# Patient Record
Sex: Male | Born: 1970
Health system: Southern US, Community
[De-identification: ages and names within clinical notes are randomized; demographics above are authoritative.]

## PROBLEM LIST (undated history)

## (undated) DIAGNOSIS — G894 Chronic pain syndrome: Secondary | ICD-10-CM

## (undated) DIAGNOSIS — F10239 Alcohol dependence with withdrawal, unspecified: Secondary | ICD-10-CM

## (undated) DIAGNOSIS — D696 Thrombocytopenia, unspecified: Secondary | ICD-10-CM

## (undated) DIAGNOSIS — F1010 Alcohol abuse, uncomplicated: Secondary | ICD-10-CM

## (undated) DIAGNOSIS — F419 Anxiety disorder, unspecified: Secondary | ICD-10-CM

## (undated) DIAGNOSIS — K7010 Alcoholic hepatitis without ascites: Secondary | ICD-10-CM

## (undated) DIAGNOSIS — J189 Pneumonia, unspecified organism: Secondary | ICD-10-CM

## (undated) DIAGNOSIS — IMO0002 Reserved for concepts with insufficient information to code with codable children: Secondary | ICD-10-CM

## (undated) DIAGNOSIS — L409 Psoriasis, unspecified: Secondary | ICD-10-CM

## (undated) DIAGNOSIS — R0600 Dyspnea, unspecified: Secondary | ICD-10-CM

## (undated) HISTORY — PX: ORIF FEMORAL SHAFT FRACTURE W/ PLATES AND SCREWS: SUR931

## (undated) HISTORY — PX: HERNIA REPAIR: SHX51

## (undated) HISTORY — PX: JOINT REPLACEMENT: SHX530

---

## 2006-04-04 ENCOUNTER — Emergency Department (HOSPITAL_COMMUNITY): Admission: EM | Admit: 2006-04-04 | Discharge: 2006-04-04 | Payer: Self-pay | Admitting: Emergency Medicine

## 2013-09-05 ENCOUNTER — Encounter (HOSPITAL_COMMUNITY): Payer: Self-pay | Admitting: Emergency Medicine

## 2013-09-05 ENCOUNTER — Inpatient Hospital Stay (HOSPITAL_COMMUNITY): Payer: MEDICAID

## 2013-09-05 ENCOUNTER — Inpatient Hospital Stay (HOSPITAL_COMMUNITY)
Admission: EM | Admit: 2013-09-05 | Discharge: 2013-09-06 | DRG: 896 | Disposition: A | Discharge status: Other Acute Inpatient Hospital | Payer: MEDICAID | Attending: Internal Medicine | Admitting: Internal Medicine

## 2013-09-05 DIAGNOSIS — R740 Nonspecific elevation of levels of transaminase and lactic acid dehydrogenase [LDH]: Secondary | ICD-10-CM

## 2013-09-05 DIAGNOSIS — F1020 Alcohol dependence, uncomplicated: Secondary | ICD-10-CM | POA: Diagnosis present

## 2013-09-05 DIAGNOSIS — B182 Chronic viral hepatitis C: Secondary | ICD-10-CM

## 2013-09-05 DIAGNOSIS — F17200 Nicotine dependence, unspecified, uncomplicated: Secondary | ICD-10-CM | POA: Diagnosis present

## 2013-09-05 DIAGNOSIS — F10939 Alcohol use, unspecified with withdrawal, unspecified: Secondary | ICD-10-CM | POA: Diagnosis present

## 2013-09-05 DIAGNOSIS — F10231 Alcohol dependence with withdrawal delirium: Principal | ICD-10-CM | POA: Diagnosis present

## 2013-09-05 DIAGNOSIS — K7460 Unspecified cirrhosis of liver: Secondary | ICD-10-CM

## 2013-09-05 DIAGNOSIS — J189 Pneumonia, unspecified organism: Secondary | ICD-10-CM | POA: Diagnosis present

## 2013-09-05 DIAGNOSIS — L408 Other psoriasis: Secondary | ICD-10-CM | POA: Diagnosis present

## 2013-09-05 DIAGNOSIS — F10931 Alcohol use, unspecified with withdrawal delirium: Principal | ICD-10-CM | POA: Diagnosis present

## 2013-09-05 DIAGNOSIS — E876 Hypokalemia: Secondary | ICD-10-CM | POA: Diagnosis present

## 2013-09-05 DIAGNOSIS — R7401 Elevation of levels of liver transaminase levels: Secondary | ICD-10-CM | POA: Diagnosis present

## 2013-09-05 DIAGNOSIS — M549 Dorsalgia, unspecified: Secondary | ICD-10-CM | POA: Diagnosis present

## 2013-09-05 DIAGNOSIS — E869 Volume depletion, unspecified: Secondary | ICD-10-CM | POA: Diagnosis present

## 2013-09-05 DIAGNOSIS — D6959 Other secondary thrombocytopenia: Secondary | ICD-10-CM | POA: Diagnosis present

## 2013-09-05 DIAGNOSIS — K700 Alcoholic fatty liver: Secondary | ICD-10-CM | POA: Diagnosis present

## 2013-09-05 DIAGNOSIS — K7010 Alcoholic hepatitis without ascites: Secondary | ICD-10-CM | POA: Diagnosis present

## 2013-09-05 DIAGNOSIS — D696 Thrombocytopenia, unspecified: Secondary | ICD-10-CM | POA: Diagnosis present

## 2013-09-05 DIAGNOSIS — F10239 Alcohol dependence with withdrawal, unspecified: Secondary | ICD-10-CM

## 2013-09-05 DIAGNOSIS — M542 Cervicalgia: Secondary | ICD-10-CM | POA: Diagnosis present

## 2013-09-05 DIAGNOSIS — R7402 Elevation of levels of lactic acid dehydrogenase (LDH): Secondary | ICD-10-CM | POA: Diagnosis present

## 2013-09-05 DIAGNOSIS — F1210 Cannabis abuse, uncomplicated: Secondary | ICD-10-CM | POA: Diagnosis present

## 2013-09-05 DIAGNOSIS — F1010 Alcohol abuse, uncomplicated: Secondary | ICD-10-CM | POA: Diagnosis present

## 2013-09-05 DIAGNOSIS — G894 Chronic pain syndrome: Secondary | ICD-10-CM | POA: Diagnosis present

## 2013-09-05 HISTORY — DX: Alcohol abuse, uncomplicated: F10.10

## 2013-09-05 HISTORY — DX: Alcoholic hepatitis without ascites: K70.10

## 2013-09-05 HISTORY — DX: Pneumonia, unspecified organism: J18.9

## 2013-09-05 HISTORY — DX: Psoriasis, unspecified: L40.9

## 2013-09-05 HISTORY — DX: Reserved for concepts with insufficient information to code with codable children: IMO0002

## 2013-09-05 HISTORY — DX: Alcohol use, unspecified with withdrawal, unspecified: F10.939

## 2013-09-05 HISTORY — DX: Alcohol dependence with withdrawal, unspecified: F10.239

## 2013-09-05 HISTORY — DX: Thrombocytopenia, unspecified: D69.6

## 2013-09-05 HISTORY — DX: Chronic pain syndrome: G89.4

## 2013-09-05 LAB — CBC WITH DIFFERENTIAL/PLATELET
Basophils Absolute: 0 10*3/uL (ref 0.0–0.1)
Basophils Relative: 1 % (ref 0–1)
Eosinophils Absolute: 0.2 10*3/uL (ref 0.0–0.7)
Eosinophils Relative: 4 % (ref 0–5)
HCT: 44.2 % (ref 39.0–52.0)
Hemoglobin: 15.5 g/dL (ref 13.0–17.0)
Lymphocytes Relative: 52 % — ABNORMAL HIGH (ref 12–46)
Lymphs Abs: 2.4 10*3/uL (ref 0.7–4.0)
MCH: 32.1 pg (ref 26.0–34.0)
MCHC: 35.1 g/dL (ref 30.0–36.0)
MCV: 91.5 fL (ref 78.0–100.0)
Monocytes Absolute: 0.3 10*3/uL (ref 0.1–1.0)
Monocytes Relative: 7 % (ref 3–12)
Neutro Abs: 1.6 10*3/uL — ABNORMAL LOW (ref 1.7–7.7)
Neutrophils Relative %: 36 % — ABNORMAL LOW (ref 43–77)
Platelets: 86 10*3/uL — ABNORMAL LOW (ref 150–400)
RBC: 4.83 MIL/uL (ref 4.22–5.81)
RDW: 12.6 % (ref 11.5–15.5)
WBC: 4.5 10*3/uL (ref 4.0–10.5)

## 2013-09-05 LAB — COMPREHENSIVE METABOLIC PANEL
ALT: 177 U/L — ABNORMAL HIGH (ref 0–53)
AST: 183 U/L — ABNORMAL HIGH (ref 0–37)
Albumin: 4.2 g/dL (ref 3.5–5.2)
Alkaline Phosphatase: 101 U/L (ref 39–117)
BUN: 7 mg/dL (ref 6–23)
CO2: 24 mEq/L (ref 19–32)
Calcium: 8.9 mg/dL (ref 8.4–10.5)
Chloride: 101 mEq/L (ref 96–112)
Creatinine, Ser: 0.77 mg/dL (ref 0.50–1.35)
GFR calc Af Amer: >90 mL/min (ref >90)
GFR calc non Af Amer: >90 mL/min (ref >90)
Glucose, Bld: 96 mg/dL (ref 70–99)
Potassium: 4.1 mEq/L (ref 3.7–5.3)
Sodium: 143 mEq/L (ref 137–147)
Total Bilirubin: 0.2 mg/dL — ABNORMAL LOW (ref 0.3–1.2)
Total Protein: 8.1 g/dL (ref 6.0–8.3)

## 2013-09-05 LAB — RAPID URINE DRUG SCREEN, HOSP PERFORMED
Amphetamines: NOT DETECTED
Barbiturates: NOT DETECTED
Benzodiazepines: NOT DETECTED
Cocaine: NOT DETECTED
Opiates: NOT DETECTED
Tetrahydrocannabinol: POSITIVE — AB

## 2013-09-05 LAB — MAGNESIUM: Magnesium: 2.2 mg/dL (ref 1.5–2.5)

## 2013-09-05 LAB — ETHANOL: Alcohol, Ethyl (B): 315 mg/dL — ABNORMAL HIGH (ref 0–11)

## 2013-09-05 LAB — PROTIME-INR
INR: 0.83 (ref 0.00–1.49)
Prothrombin Time: 11.3 seconds — ABNORMAL LOW (ref 11.6–15.2)

## 2013-09-05 IMAGING — CR DG CHEST 1V PORT
1 series · 1 of 1 positions shown · non-contrast
Comparison: Chest x-ray [DATE].

CLINICAL DATA: Shortness of breath and wheezing.

EXAM:
PORTABLE CHEST - 1 VIEW

[portable]
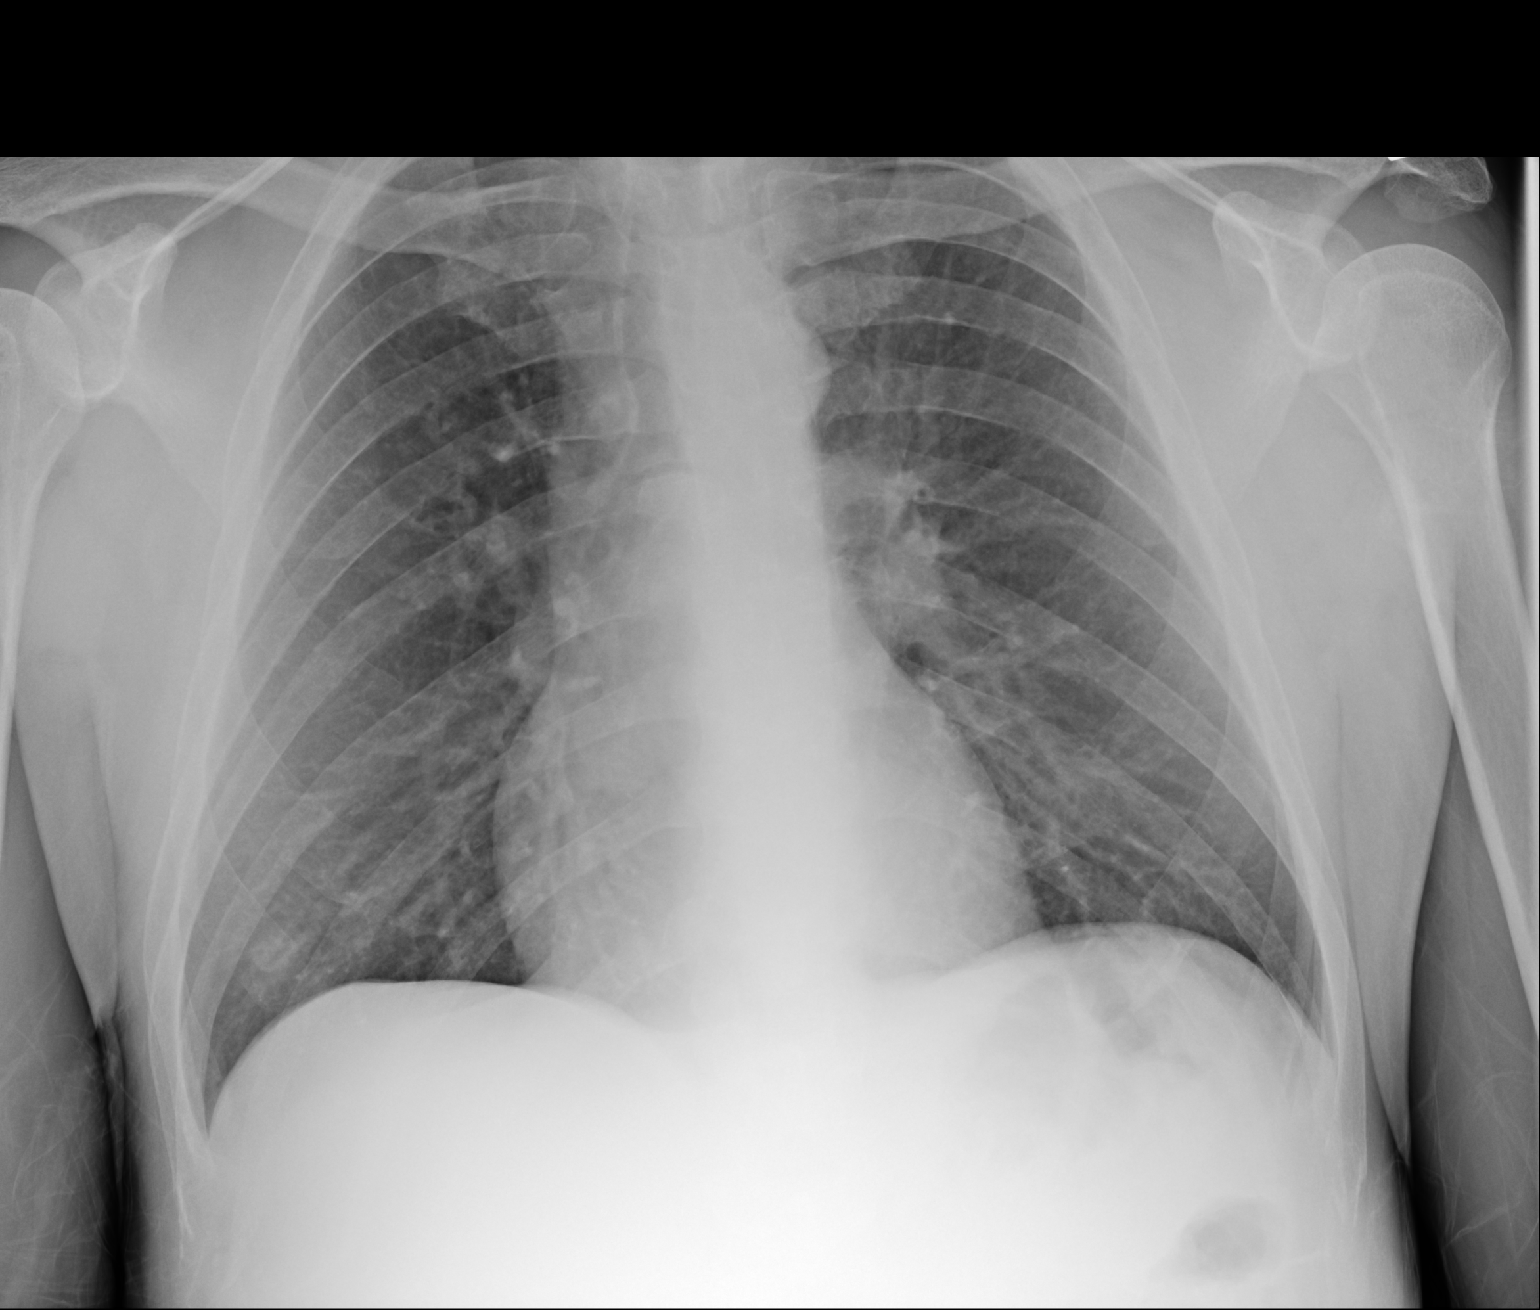

[1 of 1 positions shown; findings below may reference images not displayed]

FINDINGS: Mediastinum and hilar structures are unremarkable. Heart size and
pulmonary vascularity normal. Bilateral pulmonary interstitial
prominence noted. This suggest interstitial pneumonitis. Developing
focal nodular infiltrate versus pulmonary nodule versus nipple
shadow right lung base is noted. Followup chest x-ray suggested to
demonstrate clearing. No pleural effusion or pneumothorax. No acute
osseous abnormality.
IMPRESSION: 1.  Findings suggesting pulmonary interstitial pneumonitis.

2. Nodular density over right lung base. Follow-up chest x-ray is
suggested to demonstrate clearing. This could represent nodular
infiltrate, pulmonary nodule, or nipple shadow. Follow-up chest
x-ray should be obtained with nipple markers.

## 2013-09-05 MED ORDER — OXYCODONE HCL 5 MG PO TABS
5.0000 mg | ORAL_TABLET | ORAL | Status: DC | PRN
  Administered 2013-09-05 – 2013-09-06 (×3): 5 mg via ORAL
  Filled 2013-09-05 (×3): qty 1

## 2013-09-05 MED ORDER — LORAZEPAM 2 MG/ML IJ SOLN: 0.0000 mg | Freq: Two times a day (BID) | INTRAMUSCULAR | Status: DC

## 2013-09-05 MED ORDER — LORAZEPAM 2 MG/ML IJ SOLN
0.0000 mg | Freq: Four times a day (QID) | INTRAMUSCULAR | Status: DC
  Administered 2013-09-06 (×2): 2 mg via INTRAVENOUS
  Administered 2013-09-06: 1 mg via INTRAVENOUS
  Filled 2013-09-05 (×3): qty 1

## 2013-09-05 MED ORDER — MORPHINE SULFATE 2 MG/ML IJ SOLN
1.0000 mg | INTRAMUSCULAR | Status: DC | PRN
  Administered 2013-09-06: 1 mg via INTRAVENOUS
  Filled 2013-09-05: qty 1

## 2013-09-05 MED ORDER — LEVALBUTEROL HCL 0.63 MG/3ML IN NEBU
0.6300 mg | INHALATION_SOLUTION | Freq: Four times a day (QID) | RESPIRATORY_TRACT | Status: DC
  Administered 2013-09-06: 0.63 mg via RESPIRATORY_TRACT
  Filled 2013-09-05: qty 3

## 2013-09-05 MED ORDER — SODIUM CHLORIDE 0.9 % IV SOLN: INTRAVENOUS | Status: DC

## 2013-09-05 MED ORDER — LORAZEPAM 2 MG/ML IJ SOLN
2.0000 mg | Freq: Once | INTRAMUSCULAR | Status: AC
  Administered 2013-09-05: 2 mg via INTRAVENOUS
  Filled 2013-09-05: qty 1

## 2013-09-05 MED ORDER — NICOTINE 14 MG/24HR TD PT24
14.0000 mg | MEDICATED_PATCH | Freq: Every day | TRANSDERMAL | Status: DC
  Administered 2013-09-06 (×2): 14 mg via TRANSDERMAL
  Filled 2013-09-05: qty 1

## 2013-09-05 MED ORDER — NICOTINE 14 MG/24HR TD PT24
14.0000 mg | MEDICATED_PATCH | Freq: Every day | TRANSDERMAL | Status: DC
Start: 2013-09-06 — End: 2013-09-05
  Filled 2013-09-05: qty 1

## 2013-09-05 MED ORDER — VITAMIN B-1 100 MG PO TABS
100.0000 mg | ORAL_TABLET | Freq: Every day | ORAL | Status: DC
  Administered 2013-09-06: 100 mg via ORAL
  Filled 2013-09-05: qty 1

## 2013-09-05 MED ORDER — TRIAMCINOLONE ACETONIDE 0.1 % EX CREA
TOPICAL_CREAM | Freq: Three times a day (TID) | CUTANEOUS | Status: DC
  Administered 2013-09-06 (×2): 1 via TOPICAL
  Administered 2013-09-06: via TOPICAL
  Filled 2013-09-05: qty 15

## 2013-09-05 MED ORDER — ACETAMINOPHEN 325 MG PO TABS: 650.0000 mg | ORAL_TABLET | Freq: Four times a day (QID) | ORAL | Status: DC | PRN

## 2013-09-05 MED ORDER — LEVOFLOXACIN IN D5W 750 MG/150ML IV SOLN
750.0000 mg | Freq: Every day | INTRAVENOUS | Status: DC
  Administered 2013-09-05: 750 mg via INTRAVENOUS
  Filled 2013-09-05 (×2): qty 150

## 2013-09-05 MED ORDER — SODIUM CHLORIDE 0.9 % IV SOLN
INTRAVENOUS | Status: DC
  Administered 2013-09-05 – 2013-09-06 (×2): via INTRAVENOUS

## 2013-09-05 MED ORDER — FOLIC ACID 1 MG PO TABS
1.0000 mg | ORAL_TABLET | Freq: Every day | ORAL | Status: DC
  Administered 2013-09-06: 1 mg via ORAL
  Filled 2013-09-05: qty 1

## 2013-09-05 MED ORDER — LORAZEPAM 0.5 MG PO TABS: 1.0000 mg | ORAL_TABLET | Freq: Four times a day (QID) | ORAL | Status: DC | PRN

## 2013-09-05 MED ORDER — LEVALBUTEROL HCL 0.63 MG/3ML IN NEBU: 0.6300 mg | INHALATION_SOLUTION | RESPIRATORY_TRACT | Status: DC | PRN

## 2013-09-05 MED ORDER — LEVOFLOXACIN IN D5W 750 MG/150ML IV SOLN
INTRAVENOUS | Status: AC
  Filled 2013-09-05: qty 150

## 2013-09-05 MED ORDER — ACETAMINOPHEN 650 MG RE SUPP: 650.0000 mg | Freq: Four times a day (QID) | RECTAL | Status: DC | PRN

## 2013-09-05 MED ORDER — SODIUM CHLORIDE 0.9 % IJ SOLN
3.0000 mL | Freq: Two times a day (BID) | INTRAMUSCULAR | Status: DC
  Administered 2013-09-05 – 2013-09-06 (×2): 3 mL via INTRAVENOUS

## 2013-09-05 MED ORDER — THIAMINE HCL 100 MG/ML IJ SOLN: 100.0000 mg | Freq: Every day | INTRAMUSCULAR | Status: DC

## 2013-09-05 MED ORDER — LORAZEPAM 2 MG/ML IJ SOLN
1.0000 mg | Freq: Four times a day (QID) | INTRAMUSCULAR | Status: DC | PRN
Start: 2013-09-05 — End: 2013-09-06

## 2013-09-05 MED ORDER — ONDANSETRON HCL 4 MG PO TABS: 4.0000 mg | ORAL_TABLET | Freq: Four times a day (QID) | ORAL | Status: DC | PRN

## 2013-09-05 MED ORDER — ADULT MULTIVITAMIN W/MINERALS CH
1.0000 | ORAL_TABLET | Freq: Every day | ORAL | Status: DC
  Administered 2013-09-06: 1 via ORAL
  Filled 2013-09-05: qty 1

## 2013-09-05 MED ORDER — ONDANSETRON HCL 4 MG/2ML IJ SOLN
4.0000 mg | Freq: Four times a day (QID) | INTRAMUSCULAR | Status: DC | PRN
  Administered 2013-09-06 (×2): 4 mg via INTRAVENOUS
  Filled 2013-09-05 (×2): qty 2

## 2013-09-05 MED ORDER — SODIUM CHLORIDE 0.9 % IV BOLUS (SEPSIS)
1000.0000 mL | Freq: Once | INTRAVENOUS | Status: AC
  Administered 2013-09-05: 1000 mL via INTRAVENOUS

## 2013-09-05 MED ORDER — TRIAMCINOLONE ACETONIDE 0.1 % EX CREA
TOPICAL_CREAM | CUTANEOUS | Status: AC
  Filled 2013-09-05: qty 15
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
## 2013-09-06 ENCOUNTER — Inpatient Hospital Stay (HOSPITAL_COMMUNITY): Payer: MEDICAID

## 2013-09-06 DIAGNOSIS — K7010 Alcoholic hepatitis without ascites: Secondary | ICD-10-CM | POA: Diagnosis present

## 2013-09-06 HISTORY — DX: Alcoholic hepatitis without ascites: K70.10

## 2013-09-06 LAB — APTT: aPTT: 27 seconds (ref 24–37)

## 2013-09-06 LAB — CBC
HCT: 37.7 % — ABNORMAL LOW (ref 39.0–52.0)
Hemoglobin: 13.3 g/dL (ref 13.0–17.0)
MCH: 32.2 pg (ref 26.0–34.0)
MCHC: 35.3 g/dL (ref 30.0–36.0)
MCV: 91.3 fL (ref 78.0–100.0)
Platelets: 64 10*3/uL — ABNORMAL LOW (ref 150–400)
RBC: 4.13 MIL/uL — ABNORMAL LOW (ref 4.22–5.81)
RDW: 12.4 % (ref 11.5–15.5)
WBC: 4 10*3/uL (ref 4.0–10.5)

## 2013-09-06 LAB — HIV ANTIBODY (ROUTINE TESTING W REFLEX): HIV: NONREACTIVE

## 2013-09-06 LAB — COMPREHENSIVE METABOLIC PANEL
ALT: 136 U/L — ABNORMAL HIGH (ref 0–53)
AST: 119 U/L — ABNORMAL HIGH (ref 0–37)
Albumin: 3.5 g/dL (ref 3.5–5.2)
Alkaline Phosphatase: 82 U/L (ref 39–117)
BUN: 7 mg/dL (ref 6–23)
CO2: 25 mEq/L (ref 19–32)
Calcium: 7.9 mg/dL — ABNORMAL LOW (ref 8.4–10.5)
Chloride: 97 mEq/L (ref 96–112)
Creatinine, Ser: 0.66 mg/dL (ref 0.50–1.35)
GFR calc Af Amer: >90 mL/min (ref >90)
GFR calc non Af Amer: >90 mL/min (ref >90)
Glucose, Bld: 96 mg/dL (ref 70–99)
Potassium: 3.2 mEq/L — ABNORMAL LOW (ref 3.7–5.3)
Sodium: 136 mEq/L — ABNORMAL LOW (ref 137–147)
Total Bilirubin: 0.5 mg/dL (ref 0.3–1.2)
Total Protein: 6.8 g/dL (ref 6.0–8.3)

## 2013-09-06 LAB — HEPATITIS PANEL, ACUTE
HCV Ab: NEGATIVE
Hep A IgM: NONREACTIVE
Hep B C IgM: NONREACTIVE
Hepatitis B Surface Ag: NEGATIVE

## 2013-09-06 LAB — STREP PNEUMONIAE URINARY ANTIGEN: Strep Pneumo Urinary Antigen: NEGATIVE

## 2013-09-06 LAB — MRSA PCR SCREENING: MRSA by PCR: NEGATIVE

## 2013-09-06 LAB — VITAMIN B12: Vitamin B-12: 570 pg/mL (ref 211–911)

## 2013-09-06 IMAGING — US US ABDOMEN COMPLETE
1 series · 14 of 25 positions shown · non-contrast
Comparison: None.

CLINICAL DATA: Abnormal liver function tests.  Alcohol abuse.

EXAM:
ULTRASOUND ABDOMEN COMPLETE

[Series 1: us abdomen complete · 0.17mm/px · 14 of 98 slices shown]
[im 1/98]
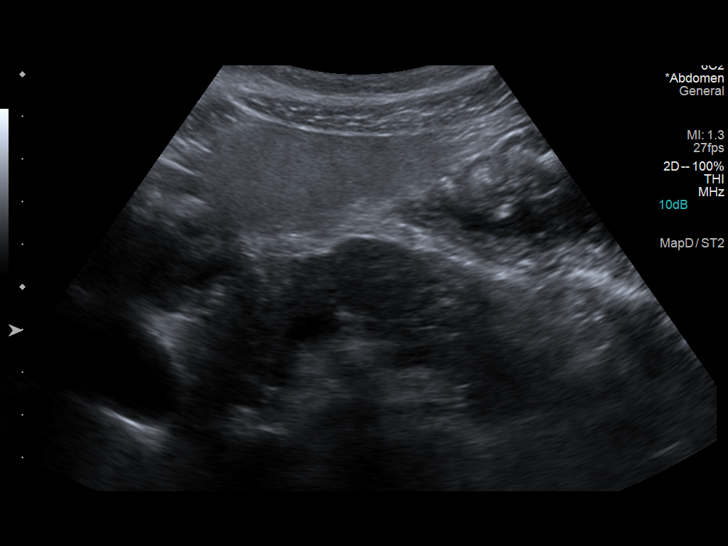
[im 9/98]
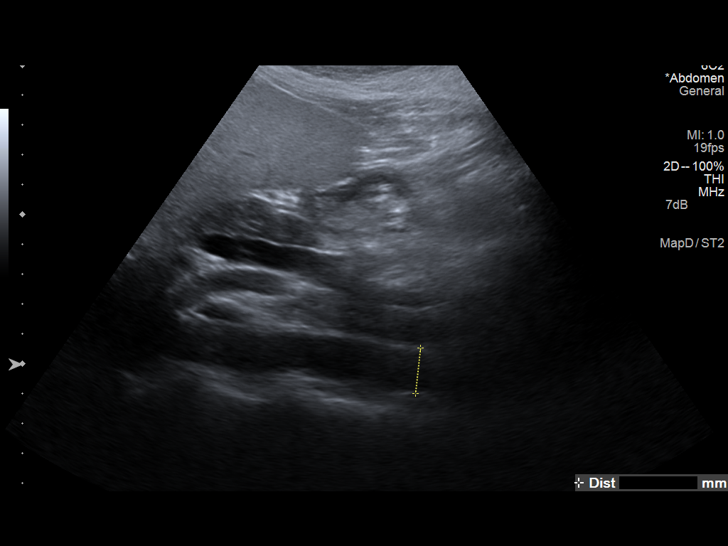
[im 17/98]
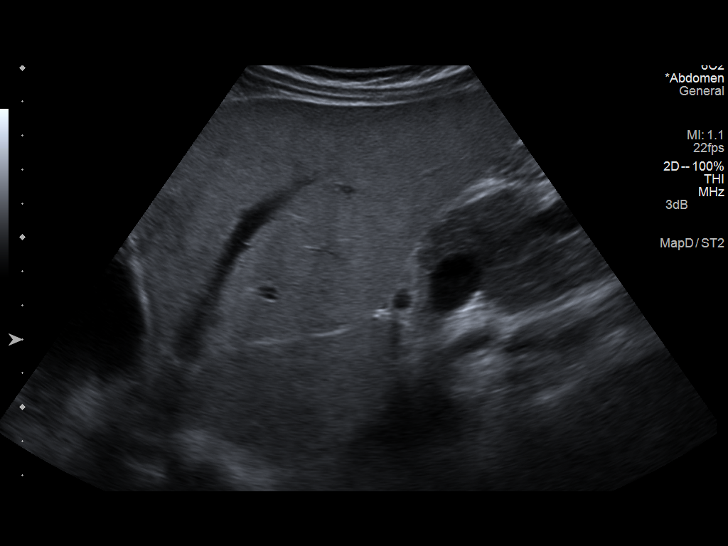
[im 25/98]
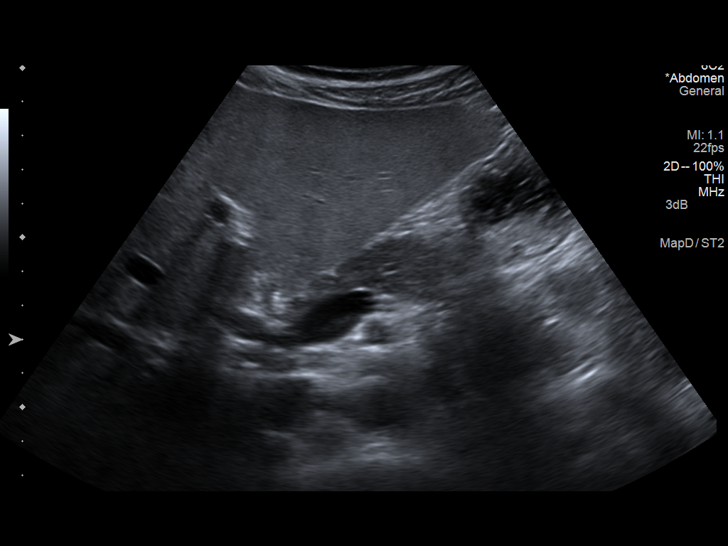
[im 33/98]
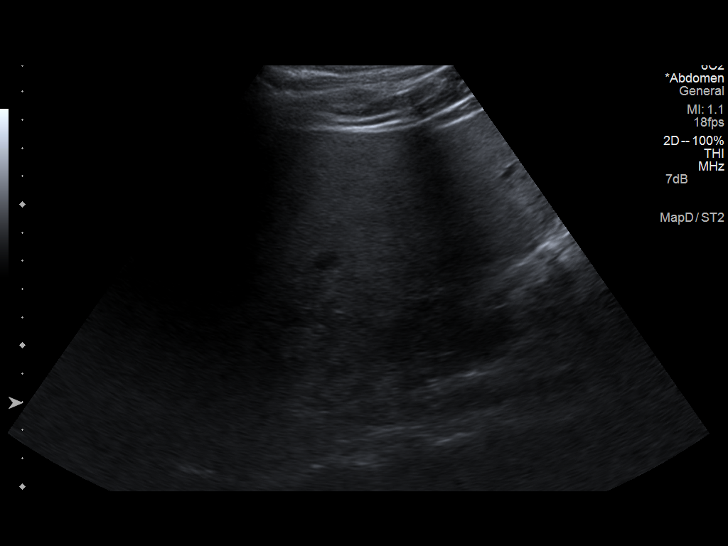
[im 37/98]
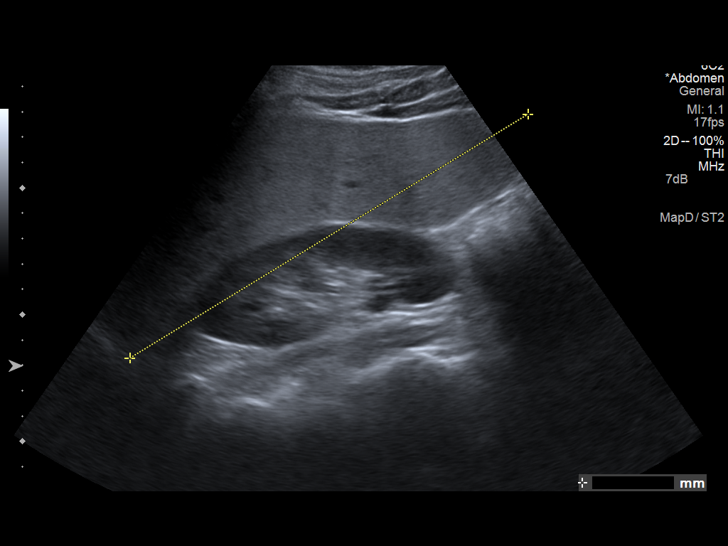
[im 45/98]
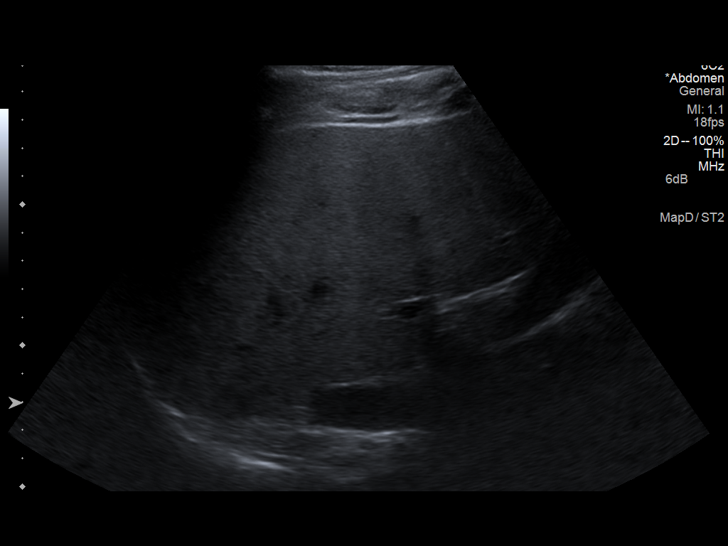
[im 53/98]
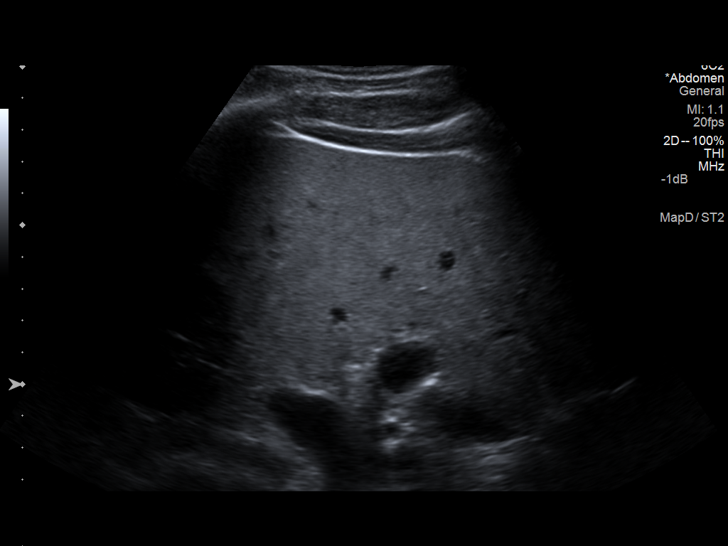
[im 61/98]
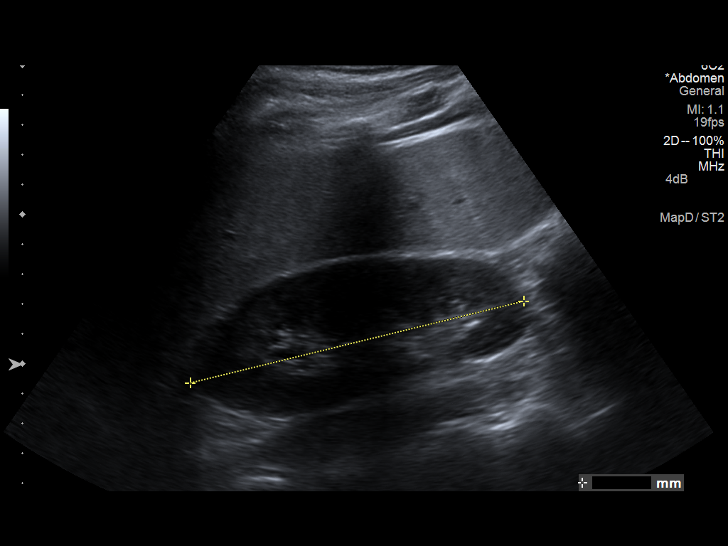
[im 65/98]
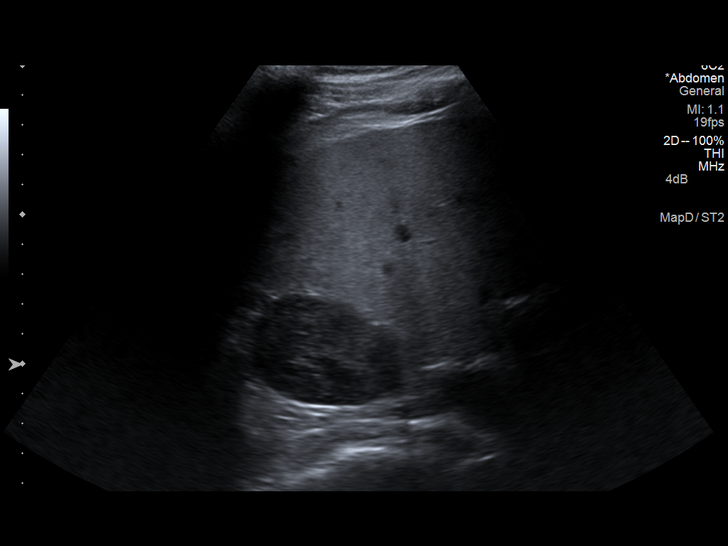
[im 73/98]
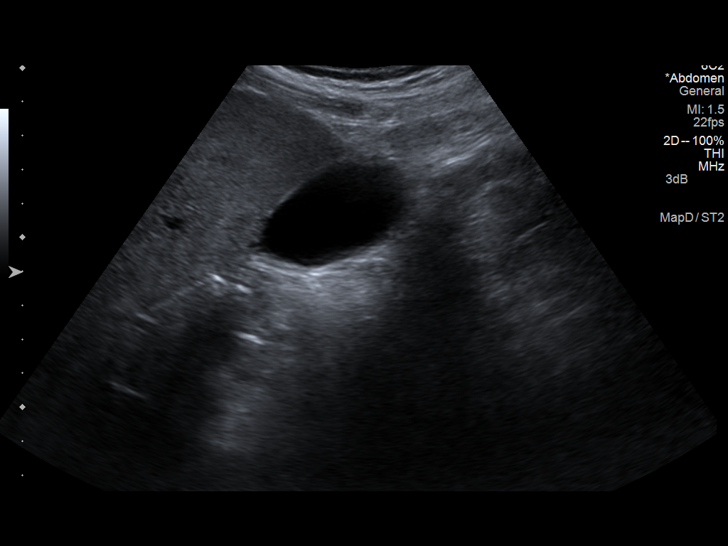
[im 81/98]
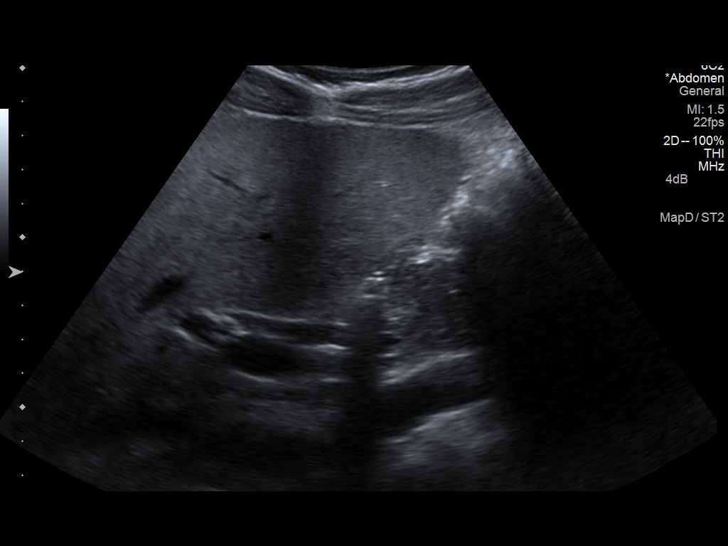
[im 89/98]
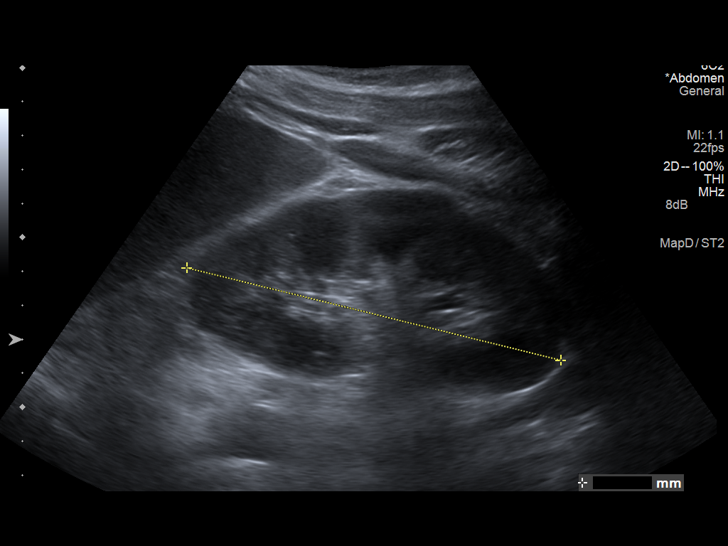
[im 98/98]
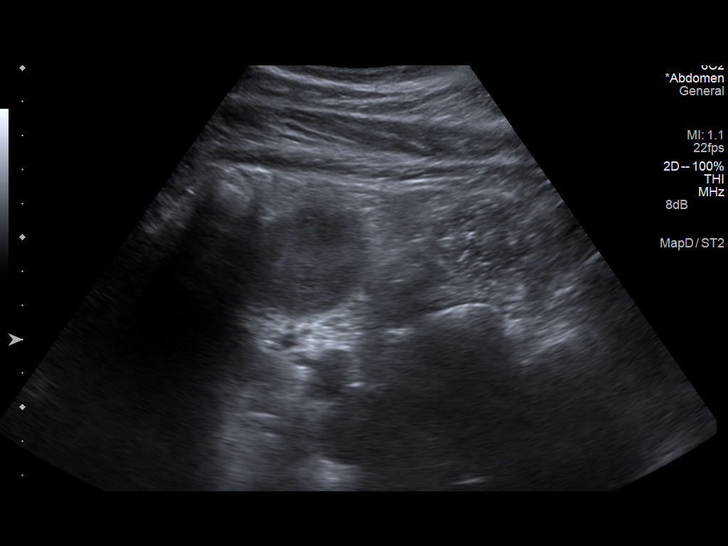

[14 of 25 positions shown; findings below may reference images not displayed]

FINDINGS: Gallbladder:

No gallstones or wall thickening visualized. No sonographic Murphy
sign noted.

Common bile duct:

Diameter: 6.7 mm.  No dilated intrahepatic bile ducts.

Liver:

No focal lesions. Diffuse increased echogenicity of the liver
parenchyma consistent with hepatic steatosis. There is hepatomegaly.

IVC:

Normal.

Pancreas:

The pancreas appears prominent and diffusely hypoechoic. No
dilatation of the pancreatic duct. No focal lesions.

Spleen:

Spleen is at the upper limits of normal, 9 cm in length.

Right Kidney:

Length: 11.5 cm. Echogenicity within normal limits. No mass or
hydronephrosis visualized.

Left Kidney:

Length: 11.3 cm. Echogenicity within normal limits. No mass or
hydronephrosis visualized.

Abdominal aorta:

Normal.  2.1 cm maximum diameter.

Other findings:

None.
IMPRESSION: IMPRESSION
Hepatomegaly with diffuse hepatic steatosis. Hypoechoic prominent
pancreas which can be seen with pancreatitis.

## 2013-09-06 IMAGING — CR DG CHEST 1V
1 series · 1 of 1 positions shown · non-contrast
Comparison: Single view of the chest [DATE].

CLINICAL DATA: Possible pulmonary nodule.

EXAM:
CHEST - 1 VIEW

[ap]
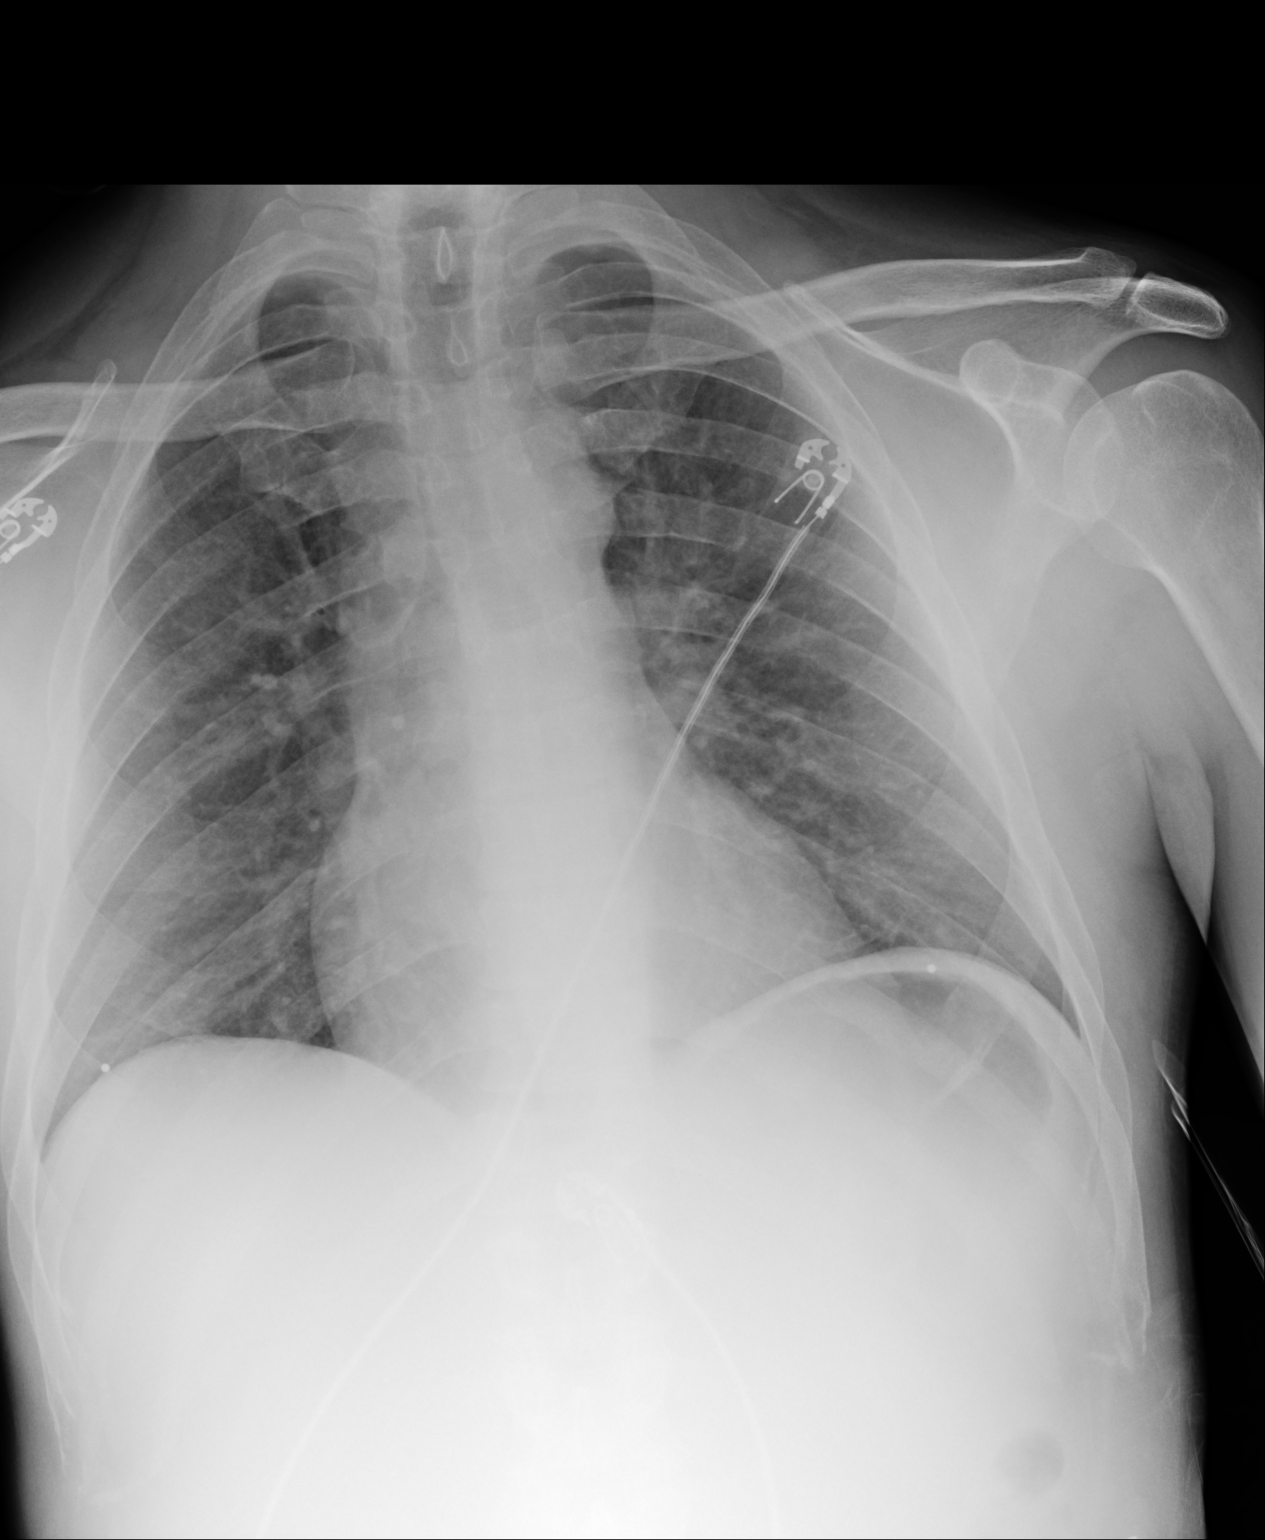

[1 of 1 positions shown; findings below may reference images not displayed]

FINDINGS: A nipple marker is in place. Questioned nodule on the prior
examination is the patient's nipple. Lungs are clear. No
pneumothorax or pleural effusion.
IMPRESSION: Negative for pulmonary nodule.  Negative exam.

## 2013-09-06 MED ORDER — POTASSIUM CHLORIDE ER 10 MEQ PO TBCR: 20.0000 meq | EXTENDED_RELEASE_TABLET | Freq: Every day | ORAL | Status: DC

## 2013-09-06 MED ORDER — LEVALBUTEROL HCL 0.63 MG/3ML IN NEBU
0.6300 mg | INHALATION_SOLUTION | Freq: Three times a day (TID) | RESPIRATORY_TRACT | Status: DC
  Administered 2013-09-06 (×2): 0.63 mg via RESPIRATORY_TRACT
  Filled 2013-09-06 (×3): qty 3

## 2013-09-06 MED ORDER — THIAMINE HCL 100 MG PO TABS: 100.0000 mg | ORAL_TABLET | Freq: Every day | ORAL | Status: DC

## 2013-09-06 MED ORDER — LEVOFLOXACIN 750 MG PO TABS: 750.0000 mg | ORAL_TABLET | Freq: Every day | ORAL | Status: DC

## 2013-09-06 MED ORDER — POTASSIUM CHLORIDE IN NACL 20-0.9 MEQ/L-% IV SOLN
INTRAVENOUS | Status: DC
  Administered 2013-09-06: 12:00:00 via INTRAVENOUS

## 2013-09-06 MED ORDER — ADULT MULTIVITAMIN W/MINERALS CH: 1.0000 | ORAL_TABLET | Freq: Every day | ORAL | Status: DC

## 2013-09-06 MED ORDER — TRIAMCINOLONE ACETONIDE 0.1 % EX CREA: TOPICAL_CREAM | Freq: Three times a day (TID) | CUTANEOUS | Status: DC

## 2013-09-06 MED ORDER — MORPHINE SULFATE 4 MG/ML IJ SOLN
3.0000 mg | INTRAMUSCULAR | Status: DC | PRN
  Administered 2013-09-06 (×2): 3 mg via INTRAVENOUS
  Filled 2013-09-06 (×2): qty 1

## 2013-09-06 MED ORDER — TRAZODONE HCL 50 MG PO TABS
25.0000 mg | ORAL_TABLET | Freq: Once | ORAL | Status: AC
  Administered 2013-09-06: 25 mg via ORAL
  Filled 2013-09-06: qty 1

## 2013-09-06 MED ORDER — OXYCODONE HCL 5 MG PO TABS: 5.0000 mg | ORAL_TABLET | ORAL | Status: DC | PRN

## 2013-09-06 MED ORDER — POTASSIUM CHLORIDE CRYS ER 20 MEQ PO TBCR
30.0000 meq | EXTENDED_RELEASE_TABLET | Freq: Two times a day (BID) | ORAL | Status: DC
  Administered 2013-09-06: 30 meq via ORAL
  Filled 2013-09-06 (×2): qty 1

## 2013-09-06 NOTE — Progress Notes (Signed)
Attempted to call in for assessment x2 which was scheduled for 1300 with Allegra GranaBob RN. Spoke with [employer] operational officersecretary who stated they still needed to obtain the tele-assessment machine. Nadine CountsBob RN will contact TTS when machine is on unit.

## 2013-09-06 NOTE — Care Management Note (Signed)
    Page 1 of 1   09/06/2013     1:28:24 PM   CARE MANAGEMENT NOTE 09/06/2013  Patient:  Frank Page,Frank Page   Account Number:  [id number]  Date Initiated:  09/06/2013  Documentation initiated by:  Sharrie RothmanBLACKWELL,Dawud Mays C  Subjective/Objective Assessment:   Pt admitted from home with etoh withdrawals. Pt live with fiance and son. Pt would like detox.     Action/Plan:   CSW is aware and will assist in placing pt for detox treatment.   Anticipated DC Date:  09/07/2013   Anticipated DC Plan:  PSYCHIATRIC HOSPITAL  In-house referral  Clinical Social Worker      DC Planning Services  CM consult      Choice offered to / List presented to:             Status of service:  Completed, signed off Medicare Important Message given?   (If response is "NO", the following Medicare IM given date fields will be blank) Date Medicare IM given:   Date Additional Medicare IM given:    Discharge Disposition:  PSYCHIATRIC HOSPITAL  Per UR Regulation:    If discussed at Long Length of Stay Meetings, dates discussed:    Comments:  09/06/13 1330 Arlyss Queenammy Asaad Gulley, RN BSN CM
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
## 2013-09-06 NOTE — Progress Notes (Signed)
UR chart review completed.  

## 2013-09-06 NOTE — Progress Notes (Signed)
Pt given discharge instructions  IV removed with site clean without any s/s of infection. Pt calm & cooperative. Pt transported via wheelchair to awaiting transportation home
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
## 2013-09-06 NOTE — Progress Notes (Signed)
[facility] PROGRESS NOTE  Frank SequinJoel D Page ZOX:[id number] DOB: September 04, 1970 DOA: 09/05/2013 PCP: Toma DeitersHASANAJ,XAJE A, MD    Code Status: Full code Family Communication: Family not available. Disposition Plan: To be determined.   Consultants:  None.  Procedures:  None.  Antibiotics:  Levaquin 09/05/2013>>  HPI/Subjective: The patient complains of neck pain, back pain, and leg pain, which is chronic. He also has to "shakes". He denies headache, nausea, vomiting, or abdominal pain.  Objective: Filed Vitals:   09/06/13 0730  BP:   Pulse:   Temp: 98.7 F (37.1 C)  Resp:    temperature 98.7. Heart rate 101. Respiratory rate 22. Blood pressure 135/88. Oxygen saturation 96% on room air.   Intake/Output Summary (Last 24 hours) at 09/06/13 0919 Last data filed at 09/06/13 0500  Gross per 24 hour  Intake    875 ml  Output      0 ml  Net    875 ml   Filed Weights   09/05/13 1732 09/05/13 2242 09/06/13 0500  Weight: 61.236 kg (135 lb) 61.1 kg (134 lb 11.2 oz) 61.1 kg (134 lb 11.2 oz)    Exam:   General:  Tremulous 43 year old Caucasian man sitting up in bed.  Cardiovascular: S1, S2, with borderline tachycardia.  Respiratory: Breathing is nonlabored. Few faint wheezes per auscultation.  Abdomen: Positive bowel sounds, soft, nontender, nondistended.  Musculoskeletal: No acute hot joints. Mild tenderness over the cervical spine and lumbar spine.  Skin: Psoriatic plaques over the lower legs.  Neuropsychiatric: He is tremulous. His speech is clear. Cranial nerves II through XII are intact.   Data Reviewed: Basic Metabolic Panel:  Recent Labs Lab 09/05/13 1950 09/06/13 0540  NA 143 136*  K 4.1 3.2*  CL 101 97  CO2 24 25  GLUCOSE 96 96  BUN 7 7  CREATININE 0.77 0.66  CALCIUM 8.9 7.9*  MG 2.2  --    Liver Function Tests:  Recent Labs Lab 09/05/13 1950 09/06/13 0540  AST 183* 119*  ALT 177* 136*  ALKPHOS 101 82  BILITOT 0.2* 0.5  PROT 8.1 6.8   ALBUMIN 4.2 3.5   No results found for this basename: LIPASE, AMYLASE,  in the last 168 hours No results found for this basename: AMMONIA,  in the last 168 hours CBC:  Recent Labs Lab 09/05/13 1950 09/06/13 0540  WBC 4.5 4.0  NEUTROABS 1.6*  --   HGB 15.5 13.3  HCT 44.2 37.7*  MCV 91.5 91.3  PLT 86* 64*   Cardiac Enzymes: No results found for this basename: CKTOTAL, CKMB, CKMBINDEX, TROPONINI,  in the last 168 hours BNP (last 3 results) No results found for this basename: PROBNP,  in the last 8760 hours CBG: No results found for this basename: GLUCAP,  in the last 168 hours  Recent Results (from the past 240 hour(s))  MRSA PCR SCREENING     Status: None   Collection Time    09/05/13  9:34 PM      Result Value Range Status   MRSA by PCR NEGATIVE  NEGATIVE Final   Comment:            The GeneXpert MRSA Assay (FDA     approved for NASAL specimens     only), is one component of a     comprehensive MRSA colonization     surveillance program. It is not     intended to diagnose MRSA     infection nor to guide or  monitor treatment for     MRSA infections.     Studies: Dg Chest Port 1 View  09/05/2013   CLINICAL DATA:  Shortness of breath and wheezing.  EXAM: PORTABLE CHEST - 1 VIEW  COMPARISON:  Chest x-ray 11/23/2011.  FINDINGS: Mediastinum and hilar structures are unremarkable. Heart size and pulmonary vascularity normal. Bilateral pulmonary interstitial prominence noted. This suggest interstitial pneumonitis. Developing focal nodular infiltrate versus pulmonary nodule versus nipple shadow right lung base is noted. Followup chest x-ray suggested to demonstrate clearing. No pleural effusion or pneumothorax. No acute osseous abnormality.  IMPRESSION: 1.  Findings suggesting pulmonary interstitial pneumonitis.  2. Nodular density over right lung base. Follow-up chest x-ray is suggested to demonstrate clearing. This could represent nodular infiltrate, pulmonary nodule, or  nipple shadow. Follow-up chest x-ray should be obtained with nipple markers.   Electronically Signed   By: Maisie Fus  Register   On: 09/05/2013 21:54    Scheduled Meds: . folic acid  1 mg Oral Daily  . levalbuterol  0.63 mg Nebulization TID  . levofloxacin (LEVAQUIN) IV  750 mg Intravenous QHS  . LORazepam  0-4 mg Intravenous Q6H   Followed by  . [START ON 09/07/2013] LORazepam  0-4 mg Intravenous Q12H  . multivitamin with minerals  1 tablet Oral Daily  . nicotine  14 mg Transdermal Daily  . sodium chloride  3 mL Intravenous Q12H  . thiamine  100 mg Oral Daily   Or  . thiamine  100 mg Intravenous Daily  . triamcinolone cream   Topical TID   Continuous Infusions: . sodium chloride 125 mL/hr at 09/05/13 2312   Assessment:  Principal Problem:   Alcohol withdrawal Active Problems:   Alcohol abuse   Transaminitis   Thrombocytopenia, unspecified   Alcoholic hepatitis   Pneumonia   Chronic pain syndrome    1. Alcohol abuse with alcohol withdrawal syndrome. We'll continue Ativan alcohol withdrawal protocol/CIWA and vitamin therapy. Continue supportive treatment. We'll assess social worker to assist with resources for sobriety.  Pneumonitis/pneumonia. He has a few faint wheezes. Continue 3 times a day Xopenex.  Hepatic transaminitis/alcoholic hepatitis. His LFTs will be monitored.  Thrombocytopenia, likely secondary to alcoholism/alcohol abuse. TSH is pending for further evaluation. We'll also order a vitamin B12 level to rule out deficiency.  Chronic pain syndrome. The patient had been treated with tramadol by his primary care physician. He states that it has been not effective. We'll continue morphine and increase the dose to 3 mg every 4 hours as needed.  Psoriasis. Continue topical steroid treatment.  Hypokalemia. We'll replete orally and in the IV fluids. His magnesium level was 2.2.  Tobacco abuse. Continue nicotine replacement therapy. The patient was advised to stop  smoking.    Plan: 1. We'll add potassium to the IV fluids. We'll give him potassium orally as well. 2. Check the results of TSH pending. We'll order a vitamin B12 level. 3. Consult social work for alcohol abuse.   Time spent: 35 minutes.    [facility] [place]  [facility] Pager [phone]. If 7PM-7AM, please contact night-coverage at [email or web address], password [email or web address] 09/06/2013, 9:19 AM  LOS: 1 day

## 2013-09-06 NOTE — Clinical Social Work Note (Signed)
Patient referred to [facility], [place] [phone] or [phone] (fax)) for detox and SA tx.  Patient signed release, [facility] for [facility].  MD requested teleassessment for possible detox placement [facility] or another facility, per [facility] teleassessment, consult received and will be scheduled.  CSW discussed option w patient , patient concerned about his child care responsibilities but acknowledged that he may be able to ask his father (who lives nearby) to assist while he is in treatment.    Santa GeneraAnne Eliab Closson, LCSW Clinical Social Worker [phone])

## 2013-09-07 ENCOUNTER — Encounter (HOSPITAL_COMMUNITY): Payer: Self-pay | Admitting: Internal Medicine

## 2013-09-07 LAB — LEGIONELLA ANTIGEN, URINE: Legionella Antigen, Urine: NEGATIVE

## 2014-03-09 ENCOUNTER — Emergency Department (HOSPITAL_COMMUNITY)
Admission: EM | Admit: 2014-03-09 | Discharge: 2014-03-11 | Disposition: A | Discharge status: Home / Self Care | Payer: MEDICAID | Attending: Emergency Medicine | Admitting: Emergency Medicine

## 2014-03-09 ENCOUNTER — Encounter (HOSPITAL_COMMUNITY): Payer: Self-pay | Admitting: Emergency Medicine

## 2014-03-09 DIAGNOSIS — Z008 Encounter for other general examination: Secondary | ICD-10-CM | POA: Insufficient documentation

## 2014-03-09 DIAGNOSIS — F17200 Nicotine dependence, unspecified, uncomplicated: Secondary | ICD-10-CM | POA: Insufficient documentation

## 2014-03-09 DIAGNOSIS — IMO0002 Reserved for concepts with insufficient information to code with codable children: Secondary | ICD-10-CM | POA: Insufficient documentation

## 2014-03-09 DIAGNOSIS — F10920 Alcohol use, unspecified with intoxication, uncomplicated: Secondary | ICD-10-CM

## 2014-03-09 DIAGNOSIS — F10239 Alcohol dependence with withdrawal, unspecified: Secondary | ICD-10-CM | POA: Insufficient documentation

## 2014-03-09 DIAGNOSIS — L408 Other psoriasis: Secondary | ICD-10-CM | POA: Insufficient documentation

## 2014-03-09 DIAGNOSIS — F1010 Alcohol abuse, uncomplicated: Secondary | ICD-10-CM | POA: Insufficient documentation

## 2014-03-09 DIAGNOSIS — K7010 Alcoholic hepatitis without ascites: Secondary | ICD-10-CM | POA: Insufficient documentation

## 2014-03-09 DIAGNOSIS — Z8701 Personal history of pneumonia (recurrent): Secondary | ICD-10-CM | POA: Insufficient documentation

## 2014-03-09 DIAGNOSIS — G894 Chronic pain syndrome: Secondary | ICD-10-CM | POA: Insufficient documentation

## 2014-03-09 DIAGNOSIS — F10939 Alcohol use, unspecified with withdrawal, unspecified: Secondary | ICD-10-CM | POA: Insufficient documentation

## 2014-03-09 DIAGNOSIS — D696 Thrombocytopenia, unspecified: Secondary | ICD-10-CM | POA: Insufficient documentation

## 2014-03-09 DIAGNOSIS — Z880 Allergy status to penicillin: Secondary | ICD-10-CM | POA: Insufficient documentation

## 2014-03-09 LAB — COMPREHENSIVE METABOLIC PANEL
ALT: 69 U/L — ABNORMAL HIGH (ref 0–53)
AST: 70 U/L — ABNORMAL HIGH (ref 0–37)
Albumin: 4.8 g/dL (ref 3.5–5.2)
Alkaline Phosphatase: 86 U/L (ref 39–117)
Anion gap: 19 — ABNORMAL HIGH (ref 5–15)
BUN: 4 mg/dL — ABNORMAL LOW (ref 6–23)
CO2: 26 mEq/L (ref 19–32)
Calcium: 9.2 mg/dL (ref 8.4–10.5)
Chloride: 100 mEq/L (ref 96–112)
Creatinine, Ser: 0.6 mg/dL (ref 0.50–1.35)
GFR calc Af Amer: >90 mL/min (ref >90)
GFR calc non Af Amer: >90 mL/min (ref >90)
Glucose, Bld: 93 mg/dL (ref 70–99)
Potassium: 4 mEq/L (ref 3.7–5.3)
Sodium: 145 mEq/L (ref 137–147)
Total Bilirubin: 0.3 mg/dL (ref 0.3–1.2)
Total Protein: 8.8 g/dL — ABNORMAL HIGH (ref 6.0–8.3)

## 2014-03-09 LAB — CBC WITH DIFFERENTIAL/PLATELET
Basophils Absolute: 0.1 10*3/uL (ref 0.0–0.1)
Basophils Relative: 2 % — ABNORMAL HIGH (ref 0–1)
Eosinophils Absolute: 0.2 10*3/uL (ref 0.0–0.7)
Eosinophils Relative: 3 % (ref 0–5)
HCT: 46.6 % (ref 39.0–52.0)
Hemoglobin: 16.4 g/dL (ref 13.0–17.0)
Lymphocytes Relative: 46 % (ref 12–46)
Lymphs Abs: 3.3 10*3/uL (ref 0.7–4.0)
MCH: 32.7 pg (ref 26.0–34.0)
MCHC: 35.2 g/dL (ref 30.0–36.0)
MCV: 92.8 fL (ref 78.0–100.0)
Monocytes Absolute: 0.1 10*3/uL (ref 0.1–1.0)
Monocytes Relative: 2 % — ABNORMAL LOW (ref 3–12)
Neutro Abs: 3.2 10*3/uL (ref 1.7–7.7)
Neutrophils Relative %: 47 % (ref 43–77)
Platelets: 299 10*3/uL (ref 150–400)
RBC: 5.02 MIL/uL (ref 4.22–5.81)
RDW: 12.6 % (ref 11.5–15.5)
WBC: 6.9 10*3/uL (ref 4.0–10.5)

## 2014-03-09 LAB — RAPID URINE DRUG SCREEN, HOSP PERFORMED
Amphetamines: NOT DETECTED
Barbiturates: NOT DETECTED
Benzodiazepines: NOT DETECTED
Cocaine: NOT DETECTED
Opiates: NOT DETECTED
Tetrahydrocannabinol: NOT DETECTED

## 2014-03-09 LAB — URINALYSIS, ROUTINE W REFLEX MICROSCOPIC
Bilirubin Urine: NEGATIVE
Glucose, UA: NEGATIVE mg/dL
Leukocytes, UA: NEGATIVE
Nitrite: NEGATIVE
Specific Gravity, Urine: 1.025 (ref 1.005–1.030)
Urobilinogen, UA: 0.2 mg/dL (ref 0.0–1.0)
pH: 5.5 (ref 5.0–8.0)

## 2014-03-09 LAB — ETHANOL: Alcohol, Ethyl (B): 421 mg/dL (ref 0–11)

## 2014-03-09 LAB — URINE MICROSCOPIC-ADD ON

## 2014-03-09 MED ORDER — IBUPROFEN 800 MG PO TABS
800.0000 mg | ORAL_TABLET | Freq: Once | ORAL | Status: AC
  Administered 2014-03-09: 800 mg via ORAL
  Filled 2014-03-09: qty 1
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
## 2014-03-09 NOTE — ED Notes (Addendum)
Daughter dropped pt here for detox  From ETOH.  Says he has been drinking all day due to pain in rt leg.  [facility] "they didn't help me"  Wanded at triage

## 2014-03-10 LAB — ETHANOL: Alcohol, Ethyl (B): <11 mg/dL (ref 0–11)

## 2014-03-10 MED ORDER — LORAZEPAM 1 MG PO TABS: 0.0000 mg | ORAL_TABLET | Freq: Two times a day (BID) | ORAL | Status: DC

## 2014-03-10 MED ORDER — IBUPROFEN 400 MG PO TABS: 600.0000 mg | ORAL_TABLET | Freq: Three times a day (TID) | ORAL | Status: DC | PRN

## 2014-03-10 MED ORDER — ZOLPIDEM TARTRATE 5 MG PO TABS
10.0000 mg | ORAL_TABLET | Freq: Every evening | ORAL | Status: DC | PRN
  Administered 2014-03-10 (×2): 10 mg via ORAL
  Filled 2014-03-10 (×2): qty 2

## 2014-03-10 MED ORDER — LORAZEPAM 1 MG PO TABS
0.0000 mg | ORAL_TABLET | Freq: Four times a day (QID) | ORAL | Status: DC
  Administered 2014-03-10 (×4): 1 mg via ORAL
  Administered 2014-03-11: 2 mg via ORAL
  Filled 2014-03-10 (×4): qty 1
  Filled 2014-03-10: qty 2

## 2014-03-10 MED ORDER — LORAZEPAM 1 MG PO TABS
1.0000 mg | ORAL_TABLET | Freq: Once | ORAL | Status: AC
  Administered 2014-03-10: 1 mg via ORAL
  Filled 2014-03-10: qty 1

## 2014-03-10 MED ORDER — NICOTINE 21 MG/24HR TD PT24
21.0000 mg | MEDICATED_PATCH | Freq: Every day | TRANSDERMAL | Status: DC
  Administered 2014-03-10: 21 mg via TRANSDERMAL
  Filled 2014-03-10 (×2): qty 1

## 2014-03-10 MED ORDER — ONDANSETRON HCL 4 MG PO TABS: 4.0000 mg | ORAL_TABLET | Freq: Three times a day (TID) | ORAL | Status: DC | PRN

## 2014-03-10 MED ORDER — ACETAMINOPHEN 325 MG PO TABS
650.0000 mg | ORAL_TABLET | ORAL | Status: DC | PRN
  Administered 2014-03-10: 650 mg via ORAL
  Filled 2014-03-10: qty 2

## 2014-03-10 MED ORDER — THIAMINE HCL 100 MG/ML IJ SOLN: 100.0000 mg | Freq: Every day | INTRAMUSCULAR | Status: DC

## 2014-03-10 MED ORDER — ALUM & MAG HYDROXIDE-SIMETH 200-200-20 MG/5ML PO SUSP: 30.0000 mL | ORAL | Status: DC | PRN

## 2014-03-10 MED ORDER — VITAMIN B-1 100 MG PO TABS
100.0000 mg | ORAL_TABLET | Freq: Every day | ORAL | Status: DC
  Administered 2014-03-10: 100 mg via ORAL
  Filled 2014-03-10: qty 1

## 2014-03-10 NOTE — BH Assessment (Signed)
Assessment complete. Frank Page, [facility] at [facility], confirmed adult unit is currently at capacity. Gave clinical report to [name], NP who said Pt meets criteria for inpatient alcohol detox but Pt's last blood pressure was elevated, he needs a CIWA and a repeated blood alcohol level. Discussed recommendation with Dr. Devoria AlbeIva Knapp. [facility] will contact other facilities when vitals, CIWA and alcohol level are back.  Harlin RainFord Ellis [name], [name], [facility] Triage Specialist [phone]

## 2014-03-10 NOTE — BH Assessment (Signed)
Received call for assessment. Spoke to Dr. Devoria AlbeIva Knapp who said Pt reports he drinks alcohol to cope with chronic leg pain. Tele-assessment will be initiated.  Harlin RainFord Ellis [name], [name], [facility] Triage Specialist [phone]

## 2014-03-10 NOTE — ED Provider Notes (Signed)
[facility], states patient needs to have a repeat BP done, only has his initial BP documented and be started on CIWA protocol which I ordered at 1 am. States they feel he meets inpatient criteria for admission, but they don't have any beds at this time. Also needs a second ETOH level documented that his level is lower, agreeable to repeat at 7 am which was ordered.   Ward GivensIva L Rakeem Colley, MD 03/10/14 [phone]

## 2014-03-10 NOTE — ED Notes (Signed)
Tele psych being done at this time. 
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
## 2014-03-11 MED ORDER — PROMETHAZINE HCL 25 MG PO TABS: 25.0000 mg | ORAL_TABLET | Freq: Four times a day (QID) | ORAL | Status: DC | PRN

## 2014-03-11 MED ORDER — LORAZEPAM 1 MG PO TABS: 1.0000 mg | ORAL_TABLET | Freq: Three times a day (TID) | ORAL | Status: DC | PRN

## 2014-03-11 NOTE — ED Notes (Signed)
Pt alert & oriented x4, stable gait. Patient given discharge instructions, paperwork & prescription(s). Patient  instructed to stop at the registration desk to finish any additional paperwork. Patient verbalized understanding. Pt left department w/ no further questions. 

## 2014-03-11 NOTE — ED Notes (Signed)
[facility] pt is recommended for inpatient detox but the pt feels he can handle this through out patient treatment. Baxter HireKristen stated that the pt is voluntary and not suicidal and can leave if he feels he can as long as the EDP agrees. Spoke with Dr. Adriana Simasook EDP, and he agrees with plan. Kristen to fax info for out patient treatment for pt at discharge.

## 2014-03-11 NOTE — Discharge Instructions (Signed)
Alcohol Withdrawal °Anytime drug use is interfering with normal living activities it has become abuse. This includes problems with family and friends. Psychological dependence has developed when your mind tells you that the drug is needed. This is usually followed by physical dependence when a continuing increase of drugs are required to get the same feeling or "high." This is known as addiction or chemical dependency. A person's risk is much higher if there is a history of chemical dependency in the family. °Mild Withdrawal Following Stopping Alcohol, When Addiction or Chemical Dependency Has Developed °When a person has developed tolerance to alcohol, any sudden stopping of alcohol can cause uncomfortable physical symptoms. Most of the time these are mild and consist of tremors in the hands and increases in heart rate, breathing, and temperature. Sometimes these symptoms are associated with anxiety, panic attacks, and bad dreams. There may also be stomach upset. Normal sleep patterns are often interrupted with periods of inability to sleep (insomnia). This may last for 6 months. Because of this discomfort, many people choose to continue drinking to get rid of this discomfort and to try to feel normal. °Severe Withdrawal with Decreased or No Alcohol Intake, When Addiction or Chemical Dependency Has Developed °About five percent of alcoholics will develop signs of severe withdrawal when they stop using alcohol. One sign of this is development of generalized seizures (convulsions). Other signs of this are severe agitation and confusion. This may be associated with believing in things which are not real or seeing things which are not really there (delusions and hallucinations). Vitamin deficiencies are usually present if alcohol intake has been long-term. Treatment for this most often requires hospitalization and close observation. °Addiction can only be helped by stopping use of all chemicals. This is hard but may  save your life. With continual alcohol use, possible outcomes are usually loss of self respect and esteem, violence, and death. °Addiction cannot be cured but it can be stopped. This often requires outside help and the care of professionals. Treatment centers are listed in the yellow pages under Cocaine, Narcotics, and Alcoholics Anonymous. Most hospitals and clinics can refer you to a specialized care center. °It is not necessary for you to go through the uncomfortable symptoms of withdrawal. Your caregiver can provide you with medicines that will help you through this difficult period. Try to avoid situations, friends, or drugs that made it possible for you to keep using alcohol in the past. Learn how to say no. °It takes a long period of time to overcome addictions to all drugs, including alcohol. There may be many times when you feel as though you want a drink. After getting rid of the physical addiction and withdrawal, you will have a lessening of the craving which tells you that you need alcohol to feel normal. Call your caregiver if more support is needed. Learn who to talk to in your family and among your friends so that during these periods you can receive outside help. Alcoholics Anonymous (AA) has helped many people over the years. To get further help, contact AA or call your caregiver, counselor, or clergyperson. Al-Anon and Alateen are support groups for friends and family members of an alcoholic. The people who love and care for an alcoholic often need help, too. For information about these organizations, check your phone directory or call a local alcoholism treatment center.  °SEEK IMMEDIATE MEDICAL CARE IF:  °· You have a seizure. °· You have a fever. °· You experience uncontrolled vomiting or you   vomit up blood. This may be bright red or look like black coffee grounds.  You have blood in the stool. This may be bright red or appear as a black, tarry, bad-smelling stool.  You become lightheaded or  faint. Do not drive if you feel this way. Have someone else drive you or call 161911 for help.  You become more agitated or confused.  You develop uncontrolled anxiety.  You begin to see things that are not really there (hallucinate). Your caregiver has determined that you completely understand your medical condition, and that your mental state is back to normal. You understand that you have been treated for alcohol withdrawal, have agreed not to drink any alcohol for a minimum of 1 day, will not operate a car or other machinery for 24 hours, and have had an opportunity to ask any questions about your condition. Document Released: 04/29/2005 Document Revised: 10/12/2011 Document Reviewed: 03/07/2008 [facility] Patient Information 2015 [place]. This information is not intended to replace advice given to you by your health care provider. Make sure you discuss any questions you have with your health care provider.  Medication for anxiety/restlessness and nausea.   No alcohol      List of alcohol support services in the [place] given.

## 2014-03-11 NOTE — BH Assessment (Signed)
[facility] Assessment Progress Note  Received a call from pt's nurse, stating pt wanted to leave ED and could follow up with outpatient SA resources.  Pt has no SI/HI/psychosis, and per nurse, EDP Adriana Simasook in agreement with pt being discharged with resources.  These were faxed to the nurse to give to the pt.  Pt to be discharged.  Updated TTS staff.  Casimer LaniusKristen Razia Screws, MS, [facility] Licensed Professional Counselor Triage Specialist

## 2014-03-11 NOTE — BH Assessment (Signed)
Frank Page, AC at [facility], confirmed adult unit is currently at capacity. Contacted the following facilities for placement:  BED AVAILABLE, FAXED CLINICAL INFORMATION: [facility], per [name], per [facility], per [name], per Lisa [facility], per [name], per [facility], per Claudine  AT CAPACITY: [facility], per Jennifer [facility], per [facility], per [name], per [facility], per [name], per Tom [name], per Jessica [name], per Rose [name], per Jennifer  NO RESPONSE: [name] [name]   Frank Page, [name], [name] [phone]  

## 2014-03-11 NOTE — ED Notes (Signed)
Pt's father present to take pt home.

## 2014-03-11 NOTE — ED Provider Notes (Signed)
No psychosis. No suicidal or homicidal ideation. We'll discharge with Ativan 1 mg and Phenergan 25 mg. Also list of behavioral health services given.  Donnetta HutchingBrian Brittny Spangle, MD 03/11/14 [phone]

## 2014-07-05 DIAGNOSIS — R410 Disorientation, unspecified: Secondary | ICD-10-CM | POA: Diagnosis not present

## 2014-08-15 DIAGNOSIS — S82251K Displaced comminuted fracture of shaft of right tibia, subsequent encounter for closed fracture with nonunion: Secondary | ICD-10-CM | POA: Diagnosis not present

## 2014-09-01 ENCOUNTER — Emergency Department (EMERGENCY_DEPARTMENT_HOSPITAL)
Admission: EM | Admit: 2014-09-01 | Discharge: 2014-09-02 | Disposition: A | Discharge status: Other Acute Inpatient Hospital | Payer: Medicare Other | Source: Home / Self Care | Attending: Emergency Medicine | Admitting: Emergency Medicine

## 2014-09-01 ENCOUNTER — Encounter (HOSPITAL_COMMUNITY): Payer: Self-pay | Admitting: Nurse Practitioner

## 2014-09-01 DIAGNOSIS — F10239 Alcohol dependence with withdrawal, unspecified: Secondary | ICD-10-CM | POA: Diagnosis not present

## 2014-09-01 DIAGNOSIS — F1010 Alcohol abuse, uncomplicated: Secondary | ICD-10-CM | POA: Diagnosis present

## 2014-09-01 DIAGNOSIS — R45851 Suicidal ideations: Secondary | ICD-10-CM

## 2014-09-01 DIAGNOSIS — F1994 Other psychoactive substance use, unspecified with psychoactive substance-induced mood disorder: Secondary | ICD-10-CM | POA: Diagnosis present

## 2014-09-01 DIAGNOSIS — F10939 Alcohol use, unspecified with withdrawal, unspecified: Secondary | ICD-10-CM | POA: Diagnosis present

## 2014-09-01 LAB — COMPREHENSIVE METABOLIC PANEL
ALT: 23 U/L (ref 0–53)
AST: 38 U/L — ABNORMAL HIGH (ref 0–37)
Albumin: 4.3 g/dL (ref 3.5–5.2)
Alkaline Phosphatase: 88 U/L (ref 39–117)
Anion gap: 13 (ref 5–15)
BUN: <5 mg/dL — ABNORMAL LOW (ref 6–23)
CO2: 25 mmol/L (ref 19–32)
Calcium: 8.9 mg/dL (ref 8.4–10.5)
Chloride: 99 mmol/L (ref 96–112)
Creatinine, Ser: 0.75 mg/dL (ref 0.50–1.35)
GFR calc Af Amer: >90 mL/min (ref >90)
GFR calc non Af Amer: >90 mL/min (ref >90)
Glucose, Bld: 97 mg/dL (ref 70–99)
Potassium: 3.8 mmol/L (ref 3.5–5.1)
Sodium: 137 mmol/L (ref 135–145)
Total Bilirubin: 0.5 mg/dL (ref 0.3–1.2)
Total Protein: 8.2 g/dL (ref 6.0–8.3)

## 2014-09-01 LAB — RAPID URINE DRUG SCREEN, HOSP PERFORMED
Amphetamines: NOT DETECTED
Barbiturates: NOT DETECTED
Benzodiazepines: NOT DETECTED
Cocaine: NOT DETECTED
Opiates: POSITIVE — AB
Tetrahydrocannabinol: POSITIVE — AB

## 2014-09-01 LAB — CBC
HCT: 48.9 % (ref 39.0–52.0)
Hemoglobin: 17.2 g/dL — ABNORMAL HIGH (ref 13.0–17.0)
MCH: 32 pg (ref 26.0–34.0)
MCHC: 35.2 g/dL (ref 30.0–36.0)
MCV: 90.9 fL (ref 78.0–100.0)
Platelets: 220 10*3/uL (ref 150–400)
RBC: 5.38 MIL/uL (ref 4.22–5.81)
RDW: 13.5 % (ref 11.5–15.5)
WBC: 7.8 10*3/uL (ref 4.0–10.5)

## 2014-09-01 LAB — ACETAMINOPHEN LEVEL: Acetaminophen (Tylenol), Serum: <10 ug/mL — ABNORMAL LOW (ref 10–30)

## 2014-09-01 LAB — ETHANOL: Alcohol, Ethyl (B): 316 mg/dL — ABNORMAL HIGH (ref 0–9)

## 2014-09-01 LAB — SALICYLATE LEVEL: Salicylate Lvl: <4 mg/dL (ref 2.8–20.0)

## 2014-09-01 MED ORDER — THIAMINE HCL 100 MG/ML IJ SOLN
100.0000 mg | Freq: Every day | INTRAMUSCULAR | Status: DC
  Administered 2014-09-02: 100 mg via INTRAVENOUS
  Filled 2014-09-01: qty 2

## 2014-09-01 MED ORDER — IBUPROFEN 200 MG PO TABS
600.0000 mg | ORAL_TABLET | Freq: Three times a day (TID) | ORAL | Status: DC | PRN
  Administered 2014-09-02 (×2): 600 mg via ORAL
  Filled 2014-09-01 (×2): qty 3

## 2014-09-01 MED ORDER — NICOTINE 21 MG/24HR TD PT24
21.0000 mg | MEDICATED_PATCH | Freq: Every day | TRANSDERMAL | Status: DC
  Administered 2014-09-02: 21 mg via TRANSDERMAL
  Filled 2014-09-01: qty 1

## 2014-09-01 MED ORDER — VITAMIN B-1 100 MG PO TABS
100.0000 mg | ORAL_TABLET | Freq: Every day | ORAL | Status: DC
  Administered 2014-09-01 – 2014-09-02 (×2): 100 mg via ORAL
  Filled 2014-09-01 (×2): qty 1

## 2014-09-01 MED ORDER — ACETAMINOPHEN 325 MG PO TABS: 650.0000 mg | ORAL_TABLET | ORAL | Status: DC | PRN

## 2014-09-01 MED ORDER — ONDANSETRON HCL 4 MG PO TABS
4.0000 mg | ORAL_TABLET | Freq: Three times a day (TID) | ORAL | Status: DC | PRN
  Administered 2014-09-02: 4 mg via ORAL
  Filled 2014-09-01: qty 1

## 2014-09-01 MED ORDER — LORAZEPAM 2 MG/ML IJ SOLN: 0.0000 mg | Freq: Four times a day (QID) | INTRAMUSCULAR | Status: DC

## 2014-09-01 MED ORDER — LORAZEPAM 1 MG PO TABS
0.0000 mg | ORAL_TABLET | Freq: Four times a day (QID) | ORAL | Status: DC
Start: 2014-09-02 — End: 2014-09-02
  Filled 2014-09-01: qty 1

## 2014-09-01 MED ORDER — LORAZEPAM 1 MG PO TABS
0.0000 mg | ORAL_TABLET | Freq: Two times a day (BID) | ORAL | Status: DC
  Administered 2014-09-01 – 2014-09-02 (×2): 1 mg via ORAL
  Filled 2014-09-01: qty 1

## 2014-09-01 MED ORDER — LORAZEPAM 2 MG/ML IJ SOLN
0.0000 mg | Freq: Two times a day (BID) | INTRAMUSCULAR | Status: DC
Start: 2014-09-01 — End: 2014-09-02

## 2014-09-01 MED ORDER — ZOLPIDEM TARTRATE 5 MG PO TABS
5.0000 mg | ORAL_TABLET | Freq: Every evening | ORAL | Status: DC | PRN
  Administered 2014-09-02: 5 mg via ORAL
  Filled 2014-09-01: qty 1

## 2014-09-01 MED ORDER — HYDROCODONE-ACETAMINOPHEN 5-325 MG PO TABS
1.0000 | ORAL_TABLET | Freq: Once | ORAL | Status: AC
  Administered 2014-09-01: 1 via ORAL
  Filled 2014-09-01: qty 1

## 2014-09-01 NOTE — ED Notes (Signed)
Pt c/o pain all over his body, states he wants help with with alcohol problem last drink at 1700hrs 2 40oz beers.

## 2014-09-01 NOTE — ED Notes (Signed)
Bed: [facility] Expected date:  Expected time:  Means of arrival:  Comments: Please dont use

## 2014-09-01 NOTE — ED Notes (Signed)
Pt unable to void at this time. 

## 2014-09-01 NOTE — BH Assessment (Signed)
Received notification of TTS consult request. Spoke to Celanese CorporationMercedes Strupp Camprubi-Soms, PA-C who said Pt is intoxicated, has a history of withdrawal seizures and is requesting detox. Tele-assessment will be initiated.  Harlin RainFord Ellis [name], [name], [facility] Triage Specialist [phone]

## 2014-09-01 NOTE — ED Provider Notes (Signed)
CSN: [id number]     Arrival date & time 09/01/14  1927 History   First MD Initiated Contact with Patient 09/01/14 2049     Chief Complaint  Patient presents with  . Alcohol Problem  . Multiple Complaints      (Consider location/radiation/quality/duration/timing/severity/associated sxs/prior Treatment) HPI Comments: Marciano SequinJoel D Darr is a 44 y.o. male with a PMHx of herniated disk, chronic pain syndrome, alcohol abuse with hx of withdrawal seizures, alcoholic hepatitis, and thrombocytopenia, who presents to the ED with complaints of alcohol intoxication and requesting detox, as well as suicidal ideations without a plan and "pain all over". He reports that he last fell on Thursday, but denies any specific injury, and states that his pain is chronic and ongoing. He reports the pain is 10/10 achy constant located all over but specifically he reports back and right leg pain which are nonradiating with no known aggravating or alleviating factors given that he has not tried anything aside from alcohol to help his pain. He states that he last drank at 4 PM, and had two 40 ounce beers today. He endorses marijuana use but denies any IV drug use. He smokes chronically. He denies any auditory or visualizations, homicidal ideations, or suicidal plan at this time. He states that he feels very thirsty. He has had a alcohol withdrawal seizures in the past and is requesting detox at this time. Denies any fevers, chills, CP, SOB, abd pain, n/v/d/c, dysuria, hematuria, new numbness/tingling/weakness, or any other complaints at this time.   Patient is a 44 y.o. male presenting with alcohol problem. The history is provided by the patient. No language interpreter was used.  Alcohol Problem This is a chronic problem. The current episode started today. The problem occurs constantly. The problem has been unchanged. Associated symptoms include arthralgias (multiple areas). Pertinent negatives include no abdominal pain, chest  pain, chills, fever, headaches, myalgias, nausea, neck pain, numbness, rash, urinary symptoms, visual change, vomiting or weakness. Nothing aggravates the symptoms. He has tried nothing for the symptoms. The treatment provided no relief.    Past Medical History  Diagnosis Date  . Herniated disc   . Nonunion of fracture   . Psoriasis   . Alcohol abuse 09/05/2013  . Alcohol withdrawal 09/05/2013    History of alcohol withdrawal seizures  . Alcoholic hepatitis 09/06/2013  . Pneumonia 09/05/2013  . Thrombocytopenia, unspecified 09/05/2013  . Chronic pain syndrome 09/05/2013   Past Surgical History  Procedure Laterality Date  . Orif femoral shaft fracture w/ plates and screws    . Hernia repair    . Joint replacement     History reviewed. No pertinent family history. History  Substance Use Topics  . Smoking status: Current Every Day Smoker -- 2.00 packs/day for 4 years    Types: Cigarettes  . Smokeless tobacco: Never Used  . Alcohol Use: 100.8 oz/week    168 Cans of beer per week     Comment: heavily    Review of Systems  Constitutional: Negative for fever and chills.  Respiratory: Negative for shortness of breath.   Cardiovascular: Negative for chest pain.  Gastrointestinal: Negative for nausea, vomiting, abdominal pain and diarrhea.  Genitourinary: Negative for dysuria and hematuria.  Musculoskeletal: Positive for back pain and arthralgias (multiple areas). Negative for myalgias, gait problem and neck pain.  Skin: Negative for rash and wound.  Neurological: Negative for weakness, numbness and headaches.  Psychiatric/Behavioral: Positive for suicidal ideas.   10 Systems reviewed and are negative for acute change  except as noted in the HPI.    Allergies  Penicillins  Home Medications   Prior to Admission medications   Medication Sig Start Date End Date Taking? Authorizing Provider  LORazepam (ATIVAN) 1 MG tablet Take 1 tablet (1 mg total) by mouth 3 (three) times daily as needed  for anxiety. 03/11/14   Donnetta Hutching, MD  promethazine (PHENERGAN) 25 MG tablet Take 1 tablet (25 mg total) by mouth every 6 (six) hours as needed. 03/11/14   Donnetta Hutching, MD   BP 138/109 mmHg  Pulse 107  Temp(Src) 98.4 F (36.9 C) (Oral)  Resp 14  SpO2 97% Physical Exam  Constitutional: He is oriented to person, place, and time. He appears well-developed and well-nourished.  Non-toxic appearance. No distress.  Intoxicated, mildly tachycardic, awake, afebrile, NAD sleeping initially but then easily aroused.  HENT:  Head: Normocephalic and atraumatic.  Mouth/Throat: Oropharynx is clear and moist. Mucous membranes are dry.  Mildly dry mucous membranes  Eyes: Conjunctivae and EOM are normal. Right eye exhibits no discharge. Left eye exhibits no discharge.  Neck: Normal range of motion. Neck supple.  FROM intact without spinous process or paraspinous muscle TTP, no bony stepoffs or deformities, no muscle spasms. No rigidity or meningeal signs. No bruising or swelling.   Cardiovascular: Regular rhythm, normal heart sounds and intact distal pulses.  Tachycardia present.  Exam reveals no gallop and no friction rub.   No murmur heard. Slightly tachycardic, reg rhythm, nl s1/s2, no m/r/g, distal pulses intact  Pulmonary/Chest: Effort normal and breath sounds normal. No respiratory distress. He has no decreased breath sounds. He has no wheezes. He has no rhonchi. He has no rales.  Abdominal: Soft. Normal appearance and bowel sounds are normal. He exhibits no distension. There is no tenderness. There is no rigidity, no rebound, no guarding, no CVA tenderness, no tenderness at McBurney's point and negative Murphy's sign.  Musculoskeletal: Normal range of motion.  MAE x4 Strength and sensation grossly intact in all extremities Pt somewhat uncooperative with exam, unable to assess gait due to intoxication  Neurological: He is alert and oriented to person, place, and time. He has normal strength. No sensory  deficit.  A&O x4 although obviously intoxicated  Skin: Skin is warm, dry and intact. No rash noted.  Psychiatric: His speech is slurred. He is not actively hallucinating. He exhibits a depressed mood. He expresses suicidal ideation. He expresses no homicidal ideation. He expresses no suicidal plans and no homicidal plans.  Slurred speech, intoxicated, mildly depressed sounding  Nursing note and vitals reviewed.   ED Course  Procedures (including critical care time) Labs Review Labs Reviewed  ACETAMINOPHEN LEVEL - Abnormal; Notable for the following:    Acetaminophen (Tylenol), Serum <10.0 (*)    All other components within normal limits  CBC - Abnormal; Notable for the following:    Hemoglobin 17.2 (*)    All other components within normal limits  COMPREHENSIVE METABOLIC PANEL - Abnormal; Notable for the following:    BUN <5 (*)    AST 38 (*)    All other components within normal limits  ETHANOL - Abnormal; Notable for the following:    Alcohol, Ethyl (B) 316 (*)    All other components within normal limits  URINE RAPID DRUG SCREEN (HOSP PERFORMED) - Abnormal; Notable for the following:    Opiates POSITIVE (*)    Tetrahydrocannabinol POSITIVE (*)    All other components within normal limits  SALICYLATE LEVEL    Imaging Review No  results found.   EKG Interpretation None      MDM   Final diagnoses:  Alcohol intoxication, uncomplicated  Suicidal ideation    44 y.o. male with EtOH abuse and "pain everywhere". Obviously intoxicated. Known diagnosis of chronic pain syndrome. No focal injury or tenderness. All extremities neurovascularly intact with soft compartments, strength and sensation grossly intact in all extremities. Pt somewhat uncooperative with exam. Hx of alcohol withdrawal seizures and DTs. +SI without a plan. Will place with seizure precautions and CIWA then await for clearance labs. Given hx of withdrawal seizures, and with SI, will consult TTS. Will give  vicodin now for pain although I feel pt may be drug seeking.   11:07 PM UDS showing opiates and THC. APAP and ASA level WNL. CBC unremarkable. CMP showing mildly elevated AST which is chronic. EtOH level is 316. Pt's vitals improved, no active signs of DTs. Will consult TTS for alcohol withdrawal/detox and SI.  BP 129/86 mmHg  Pulse 98  Temp(Src) 98.8 F (37.1 C) (Oral)  Resp 19  SpO2 98%  Meds ordered this encounter  Medications  . HYDROcodone-acetaminophen (NORCO/VICODIN) 5-325 MG per tablet 1 tablet    Sig:   . LORazepam (ATIVAN) tablet 0-4 mg    Sig:     Order Specific Question:  CIWA-AR < 5 =    Answer:  0 mg    Order Specific Question:  CIWA-AR 5 -10 =    Answer:  1 mg    Order Specific Question:  CIWA-AR 11 -15 =    Answer:  2 mg    Order Specific Question:  CIWA-AR 16 -24 =    Answer:  2 mg    Order Specific Question:  CIWA-AR 16 -24 =    Answer:  Recheck CIWA-AR in 1 hour; if > 15 notify MD    Order Specific Question:  CIWA-AR > 24 =    Answer:  4 mg    Order Specific Question:  CIWA-AR > 24 =    Answer:  Call Rapid Response  . LORazepam (ATIVAN) tablet 0-4 mg    Sig:     Order Specific Question:  CIWA-AR < 5 =    Answer:  0 mg    Order Specific Question:  CIWA-AR 5 -10 =    Answer:  1 mg    Order Specific Question:  CIWA-AR 11 -15 =    Answer:  2 mg    Order Specific Question:  CIWA-AR 16 -24 =    Answer:  2 mg    Order Specific Question:  CIWA-AR 16 -24 =    Answer:  Recheck CIWA-AR in 1 hour; if > 15 notify MD    Order Specific Question:  CIWA-AR > 24 =    Answer:  4 mg    Order Specific Question:  CIWA-AR > 24 =    Answer:  Call Rapid Response  . LORazepam (ATIVAN) injection 0-4 mg    Sig:     Order Specific Question:  CIWA-AR < 5 =    Answer:  0 mg    Order Specific Question:  CIWA-AR 5 -10 =    Answer:  1 mg    Order Specific Question:  CIWA-AR 11 -15 =    Answer:  2 mg    Order Specific Question:  CIWA-AR 16 -24 =    Answer:  2 mg    Order  Specific Question:  CIWA-AR 16 -24 =    Answer:  Recheck  CIWA-AR in 1 hour; if > 15 notify MD    Order Specific Question:  CIWA-AR > 24 =    Answer:  4 mg    Order Specific Question:  CIWA-AR > 24 =    Answer:  Call Rapid Response  . LORazepam (ATIVAN) injection 0-4 mg    Sig:     Order Specific Question:  CIWA-AR < 5 =    Answer:  0 mg    Order Specific Question:  CIWA-AR 5 -10 =    Answer:  1 mg    Order Specific Question:  CIWA-AR 11 -15 =    Answer:  2 mg    Order Specific Question:  CIWA-AR 16 -24 =    Answer:  2 mg    Order Specific Question:  CIWA-AR 16 -24 =    Answer:  Recheck CIWA-AR in 1 hour; if > 15 notify MD    Order Specific Question:  CIWA-AR > 24 =    Answer:  4 mg    Order Specific Question:  CIWA-AR > 24 =    Answer:  Call Rapid Response  . thiamine (VITAMIN B-1) tablet 100 mg    Sig:   . thiamine (B-1) injection 100 mg    Sig:   . acetaminophen (TYLENOL) tablet 650 mg    Sig:   . ibuprofen (ADVIL,MOTRIN) tablet 600 mg    Sig:   . zolpidem (AMBIEN) tablet 5 mg    Sig:   . nicotine (NICODERM CQ - dosed in mg/24 hours) patch 21 mg    Sig:   . ondansetron (ZOFRAN) tablet 4 mg    Sig:       Donnita Falls Loomis, PA-C 09/01/14 2310  Toy Baker, MD 09/02/14 1725

## 2014-09-02 ENCOUNTER — Inpatient Hospital Stay (HOSPITAL_COMMUNITY)
Admission: AD | Admit: 2014-09-02 | Discharge: 2014-09-05 | DRG: 897 | Disposition: A | Discharge status: Home / Self Care | Payer: Medicare Other | Source: Intra-hospital | Attending: Psychiatry | Admitting: Psychiatry

## 2014-09-02 DIAGNOSIS — F17210 Nicotine dependence, cigarettes, uncomplicated: Secondary | ICD-10-CM | POA: Diagnosis present

## 2014-09-02 DIAGNOSIS — R45851 Suicidal ideations: Secondary | ICD-10-CM

## 2014-09-02 DIAGNOSIS — F10239 Alcohol dependence with withdrawal, unspecified: Principal | ICD-10-CM | POA: Diagnosis present

## 2014-09-02 DIAGNOSIS — F1994 Other psychoactive substance use, unspecified with psychoactive substance-induced mood disorder: Secondary | ICD-10-CM | POA: Diagnosis present

## 2014-09-02 DIAGNOSIS — G4700 Insomnia, unspecified: Secondary | ICD-10-CM | POA: Diagnosis present

## 2014-09-02 DIAGNOSIS — F329 Major depressive disorder, single episode, unspecified: Secondary | ICD-10-CM | POA: Diagnosis present

## 2014-09-02 DIAGNOSIS — F419 Anxiety disorder, unspecified: Secondary | ICD-10-CM | POA: Diagnosis present

## 2014-09-02 DIAGNOSIS — G894 Chronic pain syndrome: Secondary | ICD-10-CM | POA: Diagnosis present

## 2014-09-02 MED ORDER — ACETAMINOPHEN 325 MG PO TABS
650.0000 mg | ORAL_TABLET | Freq: Four times a day (QID) | ORAL | Status: DC | PRN
  Administered 2014-09-02 – 2014-09-03 (×2): 650 mg via ORAL
  Filled 2014-09-02 (×2): qty 2

## 2014-09-02 MED ORDER — LORAZEPAM 1 MG PO TABS
0.0000 mg | ORAL_TABLET | Freq: Two times a day (BID) | ORAL | Status: DC
Start: 2014-09-04 — End: 2014-09-05
  Administered 2014-09-04 (×2): 1 mg via ORAL
  Filled 2014-09-02 (×2): qty 1

## 2014-09-02 MED ORDER — TRAZODONE HCL 50 MG PO TABS
50.0000 mg | ORAL_TABLET | Freq: Every day | ORAL | Status: DC
Start: 2014-09-02 — End: 2014-09-03
  Administered 2014-09-02: 50 mg via ORAL
  Filled 2014-09-02 (×4): qty 1

## 2014-09-02 MED ORDER — NICOTINE 21 MG/24HR TD PT24
21.0000 mg | MEDICATED_PATCH | Freq: Every day | TRANSDERMAL | Status: DC
Start: 2014-09-03 — End: 2014-09-05
  Administered 2014-09-03 – 2014-09-04 (×2): 21 mg via TRANSDERMAL
  Filled 2014-09-02 (×5): qty 1

## 2014-09-02 MED ORDER — VITAMIN B-1 100 MG PO TABS
100.0000 mg | ORAL_TABLET | Freq: Every day | ORAL | Status: DC
Start: 2014-09-03 — End: 2014-09-05
  Administered 2014-09-03 – 2014-09-05 (×3): 100 mg via ORAL
  Filled 2014-09-02 (×5): qty 1

## 2014-09-02 MED ORDER — ONDANSETRON HCL 4 MG PO TABS: 4.0000 mg | ORAL_TABLET | Freq: Three times a day (TID) | ORAL | Status: DC | PRN

## 2014-09-02 MED ORDER — IBUPROFEN 600 MG PO TABS
600.0000 mg | ORAL_TABLET | Freq: Three times a day (TID) | ORAL | Status: DC | PRN
  Administered 2014-09-03 – 2014-09-05 (×4): 600 mg via ORAL
  Filled 2014-09-02 (×4): qty 1

## 2014-09-02 MED ORDER — ALUM & MAG HYDROXIDE-SIMETH 200-200-20 MG/5ML PO SUSP: 30.0000 mL | ORAL | Status: DC | PRN

## 2014-09-02 MED ORDER — MAGNESIUM HYDROXIDE 400 MG/5ML PO SUSP: 30.0000 mL | Freq: Every day | ORAL | Status: DC | PRN

## 2014-09-02 MED ORDER — LORAZEPAM 1 MG PO TABS
0.0000 mg | ORAL_TABLET | Freq: Four times a day (QID) | ORAL | Status: AC
  Administered 2014-09-02 – 2014-09-03 (×4): 1 mg via ORAL
  Filled 2014-09-02 (×4): qty 1

## 2014-09-02 NOTE — Progress Notes (Addendum)
CSW continued placement efforts for patient.  [facility] [place]   [facility]; [facility]   Denied Due to Capacity: [name]  Declined Due to Acuity: [facility]    [place], [place] Disposition Social Worker [phone]
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
## 2014-09-02 NOTE — [facility] Counselor (Signed)
Annice PihJackie [facility] called to say Pt has been declined due to history of seizures.  Harlin RainFord Ellis [name], [name], [facility] Triage Specialist [phone]
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
## 2014-09-02 NOTE — BH Assessment (Signed)
Assessment completed. Consulted Maryjean Mornharles Kober, PA-C who recommended inpatient treatment. [facility]. TTS will contact other facilities for placement. Lemont Fillersatyana, PA-C has been informed of the recommendation.

## 2014-09-02 NOTE — BH Assessment (Signed)
Will initiate assessment face to face.
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
## 2014-09-02 NOTE — BH Assessment (Signed)
Frank Page, [facility], confirms adult unit is at capacity. Contacted the following facilities for placement:  BED AVAILABLE, FAXED CLINICAL INFORMATION: [facility], [facility], [name], per [name], per [name], per [name]  AT CAPACITY: [name], per [facility], per [name], per [name], per [facility], per [name], per [name], per [name], per [name], per [name], per Leticia PennaLarry [name], per [name], per [name], per [name], per [name]  NO RESPONSE: [facility]    [address] Ellis Patsy BaltimoreWarrick Jr, WisconsinLPC, [facility] Triage Specialist [phone]

## 2014-09-02 NOTE — Tx Team (Signed)
Initial Interdisciplinary Treatment Plan   PATIENT STRESSORS: Financial difficulties Health problems Substance abuse   PATIENT STRENGTHS: Average or above average intelligence Capable of independent living Motivation for treatment/growth Supportive family/friends   PROBLEM LIST: Problem List/Patient Goals Date to be addressed Date deferred Reason deferred Estimated date of resolution  "I would like help to stop drinking" 09/02/2014     Depression 09/02/2014     Chronic pain 09/02/2014                                          DISCHARGE CRITERIA:  Ability to meet basic life and health needs Improved stabilization in mood, thinking, and/or behavior Need for constant or close observation no longer present Withdrawal symptoms are absent or subacute and managed without 24-hour nursing intervention  PRELIMINARY DISCHARGE PLAN: Attend 12-step recovery group Outpatient therapy  PATIENT/FAMIILY INVOLVEMENT: This treatment plan has been presented to and reviewed with the patient, Frank Page.  The patient and family have been given the opportunity to ask questions and make suggestions.  Cranford MonBeaudry, Rakiya Krawczyk Evans 09/02/2014, 6:42 PM
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
## 2014-09-02 NOTE — Progress Notes (Signed)
Patient ID: Marciano SequinJoel D Manon, male   DOB: 16-Sep-1970, 44 y.o.   MRN: [id number] PER STATE REGULATIONS 482.30  THIS CHART WAS REVIEWED FOR MEDICAL NECESSITY WITH RESPECT TO THE PATIENT'S ADMISSION/DURATION OF STAY.  NEXT REVIEW DATE: 09/06/14  Loura HaltBARBARA Khaalid Lefkowitz, RN, BSN CASE MANAGER

## 2014-09-02 NOTE — Progress Notes (Signed)
Per Inetta Fermoina, [id number] and can be transferred at this time.  CSW informed nursing staff of the updated disposition.     Maryelizabeth Rowanressa Pamelyn Bancroft, MSW, LCSW, LCAS-A Evening Clinical Social Worker [phone]

## 2014-09-02 NOTE — BH Assessment (Signed)
Multiple calls and attempts to have Pt placed in room and tele-cart set up. Nurse is busy with other patients. [facility].  Harlin RainFord Ellis [name], [name], [facility] Triage Specialist [phone]

## 2014-09-03 DIAGNOSIS — F1020 Alcohol dependence, uncomplicated: Secondary | ICD-10-CM

## 2014-09-03 MED ORDER — GABAPENTIN 100 MG PO CAPS
100.0000 mg | ORAL_CAPSULE | Freq: Three times a day (TID) | ORAL | Status: DC
Start: 2014-09-03 — End: 2014-09-04
  Administered 2014-09-03 – 2014-09-04 (×2): 100 mg via ORAL
  Filled 2014-09-03 (×6): qty 1

## 2014-09-03 MED ORDER — DULOXETINE HCL 30 MG PO CPEP
30.0000 mg | ORAL_CAPSULE | Freq: Every day | ORAL | Status: DC
  Administered 2014-09-03 – 2014-09-05 (×3): 30 mg via ORAL
  Filled 2014-09-03 (×6): qty 1
  Filled 2014-09-03: qty 14

## 2014-09-03 MED ORDER — TRAZODONE HCL 100 MG PO TABS
100.0000 mg | ORAL_TABLET | Freq: Every evening | ORAL | Status: DC | PRN
  Administered 2014-09-03: 100 mg via ORAL
  Filled 2014-09-03: qty 1
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
## 2014-09-03 NOTE — [facility] Group Notes (Signed)
[facility] LCSW Group Therapy  09/03/2014 4:06 PM  Type of Therapy:  Group Therapy  Participation Level:  Active  Participation Quality:  Attentive  Affect:  Appropriate  Cognitive:  Alert and Oriented  Insight:  Improving  Engagement in Therapy:  Improving  Modes of Intervention:  Confrontation, Discussion, Education, Exploration, Problem-solving, Rapport Building, Socialization and Support  Summary of Progress/Problems: Today's Topic: Overcoming Obstacles. Pt identified obstacles faced currently and processed barriers involved in overcoming these obstacles. Pt identified steps necessary for overcoming these obstacles and explored motivation (internal and external) for facing these difficulties head on. Pt further identified one area of concern in their lives and chose a skill of focus pulled from their "toolbox." Frank Page was attentive and engaged during today's processing group. He shared that he plans to get connected to better primary care doctor and neurologist to "Get my pain under control." Frank Page stated that he often self medicates with alcohol to combat physical pain and wants to stop coping with pain in this way. "The first thing that I want to do is start kayaking again. It gives me so much peace and makes me feel good to be out in nature." Frank Page continues to show progress in the group setting and is able to relate to other pts and their experiences with alcoholism and addiction.   Smart, Luisangel Wainright LCSWA  09/03/2014, 4:06 PM

## 2014-09-03 NOTE — [facility] Group Notes (Signed)
[facility]   09/03/2014 10:58 AM  Participation Quality:  Invited-DID NOT ATTEND-pt sleeping in room.   Smart, American FinancialHeather LCSWA

## 2014-09-03 NOTE — Plan of Care (Signed)
Problem: Diagnosis: Increased Risk For Suicide Attempt Goal: STG-Patient Will Comply With Medication Regime Outcome: Progressing Pt has been compliant with medications this shift.       
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
## 2014-09-03 NOTE — [facility] Suicide Risk Assessment (Signed)
[facility]   Nursing information obtained from:    Demographic factors:   44 year old male, on disability Current Mental Status:   See below Loss Factors:   Chronic pain, disability Historical Factors:   Alcohol Dependence, chronic pain , resulting in some depression Risk Reduction Factors:   Sense of responsibility to family Total Time spent with patient: 45 minutes Principal Problem: alcohol dependence Diagnosis:   Patient Active Problem List   Diagnosis Date Noted  . Alcohol dependence with withdrawal with complication [F10.239] 09/02/2014  . Substance induced mood disorder [F19.94] 09/02/2014  . Suicidal ideation [R45.851]   . Alcoholic hepatitis [K70.10] 09/06/2013  . Pneumonia [J18.9] 09/05/2013  . Alcohol abuse [F10.10] 09/05/2013  . Transaminitis [R74.0] 09/05/2013  . Thrombocytopenia, unspecified [D69.6] 09/05/2013  . Chronic pain syndrome [G89.4] 09/05/2013     Continued Clinical Symptoms:  Alcohol Use Disorder Identification Test Final Score (AUDIT): 27 The "Alcohol Use Disorders Identification Test", Guidelines for Use in Primary Care, Second Edition.  World Science writerHealth Organization [facility](WHO). Score between 0-7:  no or low risk or alcohol related problems. Score between 8-15:  moderate risk of alcohol related problems. Score between 16-19:  high risk of alcohol related problems. Score 20 or above:  warrants further diagnostic evaluation for alcohol dependence and treatment.   CLINICAL FACTORS:   Alcohol Dependence , drinking daily and heavily.Also reports mild depression related to chronic pain.   Musculoskeletal: Strength & Muscle Tone: within normal limits Gait & Station: normal Patient leans: N/A  Psychiatric Specialty Exam: Physical Exam  ROS  SEE ADMIT NOTE MSE  COGNITIVE FEATURES THAT CONTRIBUTE TO RISK:  Closed-mindedness    SUICIDE RISK:   Moderate:  Frequent suicidal ideation with limited intensity, and duration, some  specificity in terms of plans, no associated intent, good self-control, limited dysphoria/symptomatology, some risk factors present, and identifiable protective factors, including available and accessible social support.  PLAN OF CARE: Patient will be admitted to inpatient psychiatric unit for stabilization and safety. Will provide and encourage milieu participation. Provide medication management and maked adjustments as needed.  Will also provide medication management to minimize risk of  Withdrawal. Will follow daily.  .    Medical Decision Making:  Review of Psycho-Social Stressors (1), Review or order clinical lab tests (1), Review of Last Therapy Session (1), Review of Medication Regimen & Side Effects (2) and Review of New Medication or Change in Dosage (2)  I certify that inpatient services furnished can reasonably be expected to improve the patient's condition.   Nakoma Gotwalt 09/03/2014, 4:25 PM

## 2014-09-03 NOTE — Progress Notes (Signed)
[facility] Group Notes:  (Nursing/MHT/Case Management/Adjunct)  Date:  09/03/2014  Time:  12:49 AM  Type of Therapy:  Group Therapy  Participation Level:  Did Not Attend  Participation Quality:  Did Not Attend  Affect:  Did Not Attend  Cognitive:  Did Not Attend  Insight:  None  Engagement in Group:  Did Not Attend  Modes of Intervention:  Socialization and Support  Summary of Progress/Problems: Pt. Was resting in bed.  Sondra ComeWilson, Hristopher Missildine J 09/03/2014, 12:49 AM

## 2014-09-03 NOTE — Progress Notes (Signed)
D: Pt has depressed affect and mood.  Pt reports "I have a lot of pain issues.  I haven't slept for days."  Pt reports he had visual hallucinations in the morning stating it looked like "the wall was swaying."  Pt denies SI/HI, denies auditory hallucinations.  He reports withdrawal symptoms of "feeling hot and then cold," anxiety, and sweating.  Pt ambulates with cane.   A:  Medications administered per order.  Safety maintained.  Pt provided with pitcher of Gatorade and PO fluids encouraged.  PRN medication administered for pain, see flowsheet.  Encouraged and supported pt.   R: Pt is compliant with medications.  He verbally contracts for safety.  Will continue to monitor and assess for safety.
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
## 2014-09-04 MED ORDER — TRAZODONE HCL 150 MG PO TABS
150.0000 mg | ORAL_TABLET | Freq: Every evening | ORAL | Status: DC | PRN
  Administered 2014-09-04: 150 mg via ORAL
  Filled 2014-09-04: qty 14
  Filled 2014-09-04: qty 1

## 2014-09-04 MED ORDER — GABAPENTIN 100 MG PO CAPS
200.0000 mg | ORAL_CAPSULE | Freq: Two times a day (BID) | ORAL | Status: DC
  Administered 2014-09-05: 200 mg via ORAL
  Filled 2014-09-04 (×2): qty 2
  Filled 2014-09-04: qty 56
  Filled 2014-09-04: qty 2
  Filled 2014-09-04: qty 56
  Filled 2014-09-04: qty 2

## 2014-09-04 MED ORDER — GABAPENTIN 300 MG PO CAPS
300.0000 mg | ORAL_CAPSULE | Freq: Four times a day (QID) | ORAL | Status: DC
  Administered 2014-09-04 (×2): 300 mg via ORAL
  Filled 2014-09-04 (×8): qty 1

## 2014-09-04 NOTE — [facility] Group Notes (Signed)
[facility] LCSW Group Therapy  09/04/2014 1:20 PM  Type of Therapy:  Group Therapy  Participation Level:  Active  Participation Quality:  Attentive  Affect:  Appropriate  Cognitive:  Alert and Oriented  Insight:  Improving  Engagement in Therapy:  Improving  Modes of Intervention:  Discussion, Education, Exploration, Problem-solving, Rapport Building, Socialization and Support  Summary of Progress/Problems: [facility] Speaker came to talk about his personal journey with substance abuse and addiction. The pt processed ways by which to relate to the speaker. [facility] speaker provided handouts and educational information pertaining to groups and services offered by the [facility].   Smart, Frank Page LCSWA 09/04/2014, 1:20 PM

## 2014-09-04 NOTE — Progress Notes (Addendum)
[facility] MD Progress Note  09/04/2014 5:49 PM Frank Page  MRN:  [id number] Subjective:  Patient is feeling " better". At this time he is not endorsing any significant symptoms of withdrawal . Objective:I have discussed case with treatment team and have met with patient. Patient is improved compared to admission. Today has minimal distal tremors and does not appear to be in any acute distress or discomfort. No diaphoresis, no facial flushing. He is tolerating medications well. He expresses concern that his chronic pain has been a factor in his ongoing alcohol consumption, as he often drinks to attenuate pain. At this time pain is mild to moderate- patient does not appear uncomfortable. He walks slowly with cane due to history of L extremity fractures. No disruptive or agitated behaviors on unit. Has been visible in milieu and has been going to groups.  Of note, patient remembers having been on Neurontin in the past, and does not remember having had side effects. He states " it did not seem to help much, but the dose may have been low". He is now on Cymbalta and Neurontin trials to address chronic pain and depression.  No side effects at this time.   Principal Problem: alcohol dependence Diagnosis:   Patient Active Problem List   Diagnosis Date Noted  . Alcohol dependence with withdrawal with complication [O96.295] 28/41/3244  . Substance induced mood disorder [F19.94] 09/02/2014  . Suicidal ideation [R45.851]   . Alcoholic hepatitis [W10.27] 09/06/2013  . Pneumonia [J18.9] 09/05/2013  . Alcohol abuse [F10.10] 09/05/2013  . Transaminitis [R74.0] 09/05/2013  . Thrombocytopenia, unspecified [D69.6] 09/05/2013  . Chronic pain syndrome [G89.4] 09/05/2013   Total Time spent with patient: 25 minutes    Past Medical History:  Past Medical History  Diagnosis Date  . Herniated disc   . Nonunion of fracture   . Psoriasis   . Alcohol abuse 09/05/2013  . Alcohol withdrawal 09/05/2013    History  of alcohol withdrawal seizures  . Alcoholic hepatitis 09/07/3662  . Pneumonia 09/05/2013  . Thrombocytopenia, unspecified 09/05/2013  . Chronic pain syndrome 09/05/2013    Past Surgical History  Procedure Laterality Date  . Orif femoral shaft fracture w/ plates and screws    . Hernia repair    . Joint replacement     Family History: No family history on file. Social History:  History  Alcohol Use  . 100.8 oz/week  . 168 Cans of beer per week    Comment: heavily     History  Drug Use No    History   Social History  . Marital Status: Divorced    Spouse Name: N/A    Number of Children: N/A  . Years of Education: N/A   Social History Main Topics  . Smoking status: Current Every Day Smoker -- 2.00 packs/day for 4 years    Types: Cigarettes  . Smokeless tobacco: Never Used  . Alcohol Use: 100.8 oz/week    168 Cans of beer per week     Comment: heavily  . Drug Use: No  . Sexual Activity: Not on file   Other Topics Concern  . Not on file   Social History Narrative   Additional History:    Sleep: improved   Appetite:  Good   Assessment:   Musculoskeletal: Strength & Muscle Tone: within normal limits Gait & Station: normal Patient leans: N/A   Psychiatric Specialty Exam: Physical Exam  Review of Systems  Constitutional: Negative for fever and chills.  Respiratory: Negative for  cough and shortness of breath.   Cardiovascular: Negative for chest pain.  Gastrointestinal: Negative for vomiting and blood in stool.  Musculoskeletal: Negative.        Chronic lower extremity pain   Neurological: Negative for headaches.  Psychiatric/Behavioral: Positive for substance abuse. Negative for suicidal ideas.    Blood pressure 139/88, pulse 109, temperature 98.7 F (37.1 C), temperature source Oral, resp. rate 18, height '5\' 2"'  (1.575 m), weight 136 lb (61.689 kg), SpO2 97 %.Body mass index is 24.87 kg/(m^2).  General Appearance: improved grooming   Eye Contact::  Good   Speech:  Normal Rate  Volume:  Normal  Mood:  improved mood, denies feeling depressed at present  Affect:  Appropriate  Thought Process:  Goal Directed and Linear  Orientation:  Full (Time, Place, and Person)  Thought Content:  no hallucinations, no delusions  Suicidal Thoughts:  No  Homicidal Thoughts:  No  Memory:  recent and remote grossly intact  Judgement:  Other:  improved   Insight:  Present  Psychomotor Activity:  no restlessness, no tremors or agitation noted at this time  Concentration:  Good  Recall:  Good  Fund of Knowledge:Good  Language: Good  Akathisia:  Negative  Handed:  Right  AIMS (if indicated):     Assets:  Communication Skills Desire for Improvement Resilience Vocational/Educational  ADL's:  Improving   Cognition: fully alert, attentive, oriented x 3   Sleep:  Number of Hours: 6.25     Current Medications: Current Facility-Administered Medications  Medication Dose Route Frequency Provider Last Rate Last Dose  . acetaminophen (TYLENOL) tablet 650 mg  650 mg Oral Q6H PRN Waylan Boga, NP   650 mg at 09/03/14 0646  . alum & mag hydroxide-simeth (MAALOX/MYLANTA) 200-200-20 MG/5ML suspension 30 mL  30 mL Oral Q4H PRN Waylan Boga, NP      . DULoxetine (CYMBALTA) DR capsule 30 mg  30 mg Oral Daily Jenne Campus, MD   30 mg at 09/04/14 0756  . gabapentin (NEURONTIN) capsule 300 mg  300 mg Oral QID Encarnacion Slates, NP   300 mg at 09/04/14 1203  . ibuprofen (ADVIL,MOTRIN) tablet 600 mg  600 mg Oral Q8H PRN Waylan Boga, NP   600 mg at 09/03/14 2042  . LORazepam (ATIVAN) tablet 0-4 mg  0-4 mg Oral Q12H Waylan Boga, NP   1 mg at 09/04/14 0755  . magnesium hydroxide (MILK OF MAGNESIA) suspension 30 mL  30 mL Oral Daily PRN Waylan Boga, NP      . nicotine (NICODERM CQ - dosed in mg/24 hours) patch 21 mg  21 mg Transdermal Daily Waylan Boga, NP   21 mg at 09/04/14 0800  . ondansetron (ZOFRAN) tablet 4 mg  4 mg Oral Q8H PRN Waylan Boga, NP      . thiamine  (VITAMIN B-1) tablet 100 mg  100 mg Oral Daily Waylan Boga, NP   100 mg at 09/04/14 0755  . traZODone (DESYREL) tablet 150 mg  150 mg Oral QHS PRN Encarnacion Slates, NP        Lab Results: No results found for this or any previous visit (from the past 48 hour(s)).  Physical Findings: AIMS: Facial and Oral Movements Muscles of Facial Expression: None, normal Lips and Perioral Area: None, normal Jaw: None, normal Tongue: None, normal,Extremity Movements Upper (arms, wrists, hands, fingers): None, normal Lower (legs, knees, ankles, toes): None, normal, Trunk Movements Neck, shoulders, hips: None, normal, Overall Severity Severity of abnormal movements (highest  score from questions above): None, normal Incapacitation due to abnormal movements: None, normal Patient's awareness of abnormal movements (rate only patient's report): No Awareness, Dental Status Current problems with teeth and/or dentures?: No Does patient usually wear dentures?: No  CIWA:  CIWA-Ar Total: 0 COWS:     Assessment- patient improved compared to admission and not presenting with any severe withdrawal symptoms at this time. Reports chronic pain is a factor that has contributed to perpetuate his drinking . Tolerating detox protocol, neurontin and cymbalta trials well thus far. Treatment Plan Summary: Daily contact with patient to assess and evaluate symptoms and progress in treatment, Medication management, Plan continue inpatient treatment  and continue medications as below  Cymbalta 30 mgrs QDAY Increase Neurontin to 200 mgrs BID  Ativan Detox Protocol, as per CIWA scores. Trazodone 150 mgr QHS PRN Insonmia    Medical Decision Making:  Established Problem, Stable/Improving (1), Review of Psycho-Social Stressors (1), Review or order clinical lab tests (1), Review of Last Therapy Session (1), Review of Medication Regimen & Side Effects (2) and Review of New Medication or Change in Dosage (2)     COBOS,  FERNANDO 09/04/2014, 5:49 PM
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
## 2014-09-04 NOTE — Progress Notes (Signed)
D: Pt presents anxious today. Pt denies having any withdrawal symptoms. Pt denies any suicidal thoughts or depression. Pt stated that he is concerned about being discharged home because he may start back drinking. Pt verbalized that his pain is not being addressed and so he may relapse. Per pt, he uses alcohol to have ease the pain. Pt reported having difficulty sleeping last night and reported sleeping only four hours. Pt visible on the milieu and engages with other patients on the unit.  Pt requested pains meds. MD made aware of pt request.  A: Medications administered as ordered per MD. Pt Neurontin and Trazodone increased per NP. Verbal support given. Pt encouraged to attend groups. 15 minute checks performed for safety.  R: Pt receptive to treatment.

## 2014-09-04 NOTE — BHH Suicide Risk Assessment (Signed)
BHH INPATIENT:  Family/Significant Other Suicide Prevention Education  Suicide Prevention Education:  Contact Attempts: Maci Brame (pt's daughter) [phone] has been identified by the patient as the family member/significant other with whom the patient will be residing, and identified as the person(s) who will aid the patient in the event of a mental health crisis.  With written consent from the patient, two attempts were made to provide suicide prevention education, prior to and/or following the patient's discharge.  We were unsuccessful in providing suicide prevention education.  A suicide education pamphlet was given to the patient to share with family/significant other.  Date and time of first attempt: 09/04/14 10:30AM  Date and time of second attempt: 09/04/14 3:30PM (voicemail left requesting call back at her earliest convenience)   Smart, TobaccovilleHeather LCSWA  09/04/2014, 3:30 PM

## 2014-09-04 NOTE — Clinical Social Work Note (Signed)
CSW met with pt individually to discuss aftercare plan. Per Edwyna Shell, LCSW and Agustina Caroli, NP, we cannot make referral to Pain Clinic from hospital--rather, pt must be referred by his PCP. Pt does not want to return to his PCP.Referral sent to [facility] for non-narcotic pain management. He will need appt for follow-up/PCP at [facility]. Per Leana Roe (referral dept), she will call pt directly after they review his referral to determine his appropriateness for treatment at their agency. He requested referral to [facility] in [place] for med management and therapy-earliest appt was March 2016--pt must call inter Pt agreeable to above aftercare plan. He will also be provided with comprehensive AA list for [facility]. CSW attempted to contact pt's daughter for SPE, discuss d/c tomorrow and transportation home, and to review aftercare per pt request-message left for her (Maci Brame) [phone].

## 2014-09-04 NOTE — Progress Notes (Signed)
D. Pt had been up and visible in milieu this evening, did attend and participate in various milieu activities. Pt spoke about how he has been having difficulty sleeping and is hoping for a good night's sleep tonight. Pt spoke about his day and that it went well overall. Pt does not display any tremors or diaphoresis this evening and does not appear to be in any acute distress concerning withdrawal. Pt did speak about how he is hopeful for discharge in the morning. Pt did receive some medication this evening for sleep and for pain. A. Support and encouragement provided. R. Safety maintained, will continue to monitor.

## 2014-09-04 NOTE — BHH Group Notes (Signed)
Adult Psychoeducational Group Note  Date:  09/04/2014 Time:  10:19 PM  Group Topic/Focus:  Wrap-Up Group:   The focus of this group is to help patients review their daily goal of treatment and discuss progress on daily workbooks.  Participation Level:  Minimal  Participation Quality:  Attentive  Affect:  Flat  Cognitive:  Appropriate  Insight: Good  Engagement in Group:  Limited  Modes of Intervention:  Discussion  Additional Comments:  Francis DowseJoel said his day was okay.  He expressed he got closer to the group and was going to build off of those relationships he has made with his peers.  He stated the doctor made some adjustments to his medications and he is discharging tomorrow.  Caroll RancherLindsay, Maureen Delatte A 09/04/2014, 10:19 PM

## 2014-09-05 ENCOUNTER — Encounter (HOSPITAL_COMMUNITY): Payer: Self-pay | Admitting: Registered Nurse

## 2014-09-05 DIAGNOSIS — F10239 Alcohol dependence with withdrawal, unspecified: Principal | ICD-10-CM

## 2014-09-05 MED ORDER — TRAZODONE HCL 150 MG PO TABS: 150.0000 mg | ORAL_TABLET | Freq: Every evening | ORAL | Status: DC | PRN

## 2014-09-05 MED ORDER — DULOXETINE HCL 30 MG PO CPEP: 30.0000 mg | ORAL_CAPSULE | Freq: Every day | ORAL | Status: DC

## 2014-09-05 MED ORDER — GABAPENTIN 100 MG PO CAPS: 200.0000 mg | ORAL_CAPSULE | Freq: Two times a day (BID) | ORAL | Status: DC

## 2014-09-05 NOTE — Progress Notes (Signed)
Recreation Therapy Notes   Animal-Assisted Activity/Therapy (AAA/T) Program Checklist/Progress Notes Patient Eligibility Criteria Checklist & Daily Group note for Rec Tx Intervention  Date: 02.02.2016 Time: 2:45pm Location: [address] Dayroom   AAA/T Program Assumption of Risk Form signed by Patient/ or Parent Legal Guardian yes  Patient is free of allergies or sever asthma yes  Patient reports no fear of animals yes  Patient reports no history of cruelty to animals yes  Patient understands his/her participation is voluntary yes  Patient washes hands before animal contact yes  Patient washes hands after animal contact yes  Behavioral Response: Appropriate   Education: Hand Washing, Appropriate Animal Interaction   Education Outcome: Acknowledges education.   Clinical Observations/Feedback: Patient interacted appropriately with therapy dog and peers during session.   Clio Gerhart L Vernida Mcnicholas, LRT/CTRS  Mead Slane L 09/05/2014 7:51 AM 

## 2014-09-05 NOTE — BHH Suicide Risk Assessment (Signed)
BHH INPATIENT:  Family/Significant Other Suicide Prevention Education  Suicide Prevention Education:  Education Completed; Maci Phillips ClimesBrame (pt's daughter) [phone] ,  (name of family member/significant other) has been identified by the patient as the family member/significant other with whom the patient will be residing, and identified as the person(s) who will aid the patient in the event of a mental health crisis (suicidal ideations/suicide attempt).  With written consent from the patient, the family member/significant other has been provided the following suicide prevention education, prior to the and/or following the discharge of the patient.  The suicide prevention education provided includes the following:  Suicide risk factors  Suicide prevention and interventions  National Suicide Hotline telephone number  [facility] assessment telephone number  [facility] Emergency Assistance 911  [facility] and/or Residential Mobile Crisis Unit telephone number  Request made of family/significant other to:  Remove weapons (e.g., guns, rifles, knives), all items previously/currently identified as safety concern.    Remove drugs/medications (over-the-counter, prescriptions, illicit drugs), all items previously/currently identified as a safety concern.  The family member/significant other verbalizes understanding of the suicide prevention education information provided.  The family member/significant other agrees to remove the items of safety concern listed above.  Ozro Russett, [place] CarboKristin L 09/05/2014, 11:20 AM

## 2014-09-05 NOTE — Clinical Social Work Note (Signed)
CSW spoke with patient's daughter Frank Page who will pick patient up around 1 pm. CSW provided SPE to daughter and provided emotional support.  Samuella BruinKristin Tennie Grussing, MSW, [employer]WA Clinical Social Worker [facility] [phone]

## 2014-09-05 NOTE — Progress Notes (Signed)
D. Pt had been up and visible in milieu this evening, did attend and participate in various activities. Pt spoke about on-going insomnia and how he had difficulties sleeping last night. Pt did endorse pain but spoke about medication adjustment and is hopeful that will help. Pt does not appear to be in any acute distress concerning withdrawal. Pt did speak about how he hopes for a good night's sleep and spoke about possible discharge in the morning. A. Support and encouragement provided. R. Safety maintained, will continue to monitor.

## 2014-09-05 NOTE — Progress Notes (Signed)
  [facility] :  Will you be returning to the same living situation after discharge:  Yes,  patient plans to return home At discharge, do you have transportation home?: Yes,  patient's daughter will provide transportation Do you have the ability to pay for your medications: Yes,  patient will be provided with prescriptions at discharge  Release of information consent forms completed and in the chart;  Patient's signature needed at discharge.  Patient to Follow up at: Follow-up Information    Follow up with [facility] On 10/19/2014.   Why:  9:00AM Dr. Tenny Crawoss for medication management.    Contact information:   [address] [place], [place] [address] Phone: [phone] Fax: X      Follow up with [facility] On 10/22/2014.   Why:  Appt on this date for therapy at 9:45AM with Dr. Kieth Brightlyodenbough.    Contact information:   [address] [place], [place] [address] Phone: [phone] Fax: X      Follow up with [facility].   Why:  Referral sent 09/04/14. Office will call you directly to notify you regarding status of referral after discharge.    Contact information:   [address] [place], [place] [address] Phone: [phone] Fax: [phone]      [facility].   Why:  Please contact at 9 am to schedule new patient appointment.   Contact information:   [address] [place], [place] [address] Phone: [phone] Fax: [phone]      Patient denies SI/HI: Yes,  denies    Safety Planning and Suicide Prevention discussed: Yes,  with patient and daughter  Has patient been referred to the Quitline?: Patient refused referral  Frank Page, [place] CarboKristin L 09/05/2014, 11:21 AM

## 2014-09-05 NOTE — [facility] Group Notes (Signed)
   [facility]  09/05/2014  8:45 AM   Participation Quality: Alert, Appropriate and Oriented  Mood/Affect: Appropriate  Depression Rating: 0  Anxiety Rating: 0  Thoughts of Suicide: Pt denies SI/HI  Will you contract for safety? Yes  Current AVH: Pt denies  Plan for Discharge/Comments: Pt attended discharge planning group and actively participated in group. CSW provided pt with today's workbook. Patient reports feeling "okay" today and expressed experiencing physical pain and concern that he will not be able to follow up with his medications at discharge. CSW informed patient that he will be provided with prescriptions at discharge and encouraged him to follow up with his outpatient providers. Patient reports that he plans to return home to follow up with outpatient services at discharge.  Transportation Means: Pt reports access to transportation  Supports: No supports mentioned at this time  Samuella BruinKristin Hazelyn Kallen, MSW, [employer]CSWA Clinical Social Worker [facility] [phone]

## 2014-09-05 NOTE — [facility] Suicide Risk Assessment (Signed)
[facility]   Demographic Factors:  44 year old male, employed, on disability  Total Time spent with patient: 30 minutes  Musculoskeletal: Strength & Muscle Tone: within normal limits Gait & Station:walks with cane  Patient leans: N/A  Psychiatric Specialty Exam: Physical Exam  ROS  Blood pressure 111/94, pulse 119, temperature 98.4 F (36.9 C), temperature source Oral, resp. rate 18, height 5\' 2"  (1.575 m), weight 136 lb (61.689 kg), SpO2 97 %.Body mass index is 24.87 kg/(m^2).  General Appearance: Well Groomed  Patent attorneyye Contact::  Good  Speech:  Normal Rate  Volume:  Normal  Mood:  Euthymic  Affect:  Appropriate  Thought Process:  Goal Directed and Linear  Orientation:  Full (Time, Place, and Person)  Thought Content:  no hallucinations, no delusions   Suicidal Thoughts:  No  Homicidal Thoughts:  No  Memory:  Recent and remote grossly intact   Judgement:  Other:  improved   Insight:  Good  Psychomotor Activity:  Normal  Concentration:  Good  Recall:  Good  Fund of Knowledge:Good  Language: Good  Akathisia:  Negative  Handed:  Right  AIMS (if indicated):     Assets:  Communication Skills Desire for Improvement Housing Resilience  Sleep:  Number of Hours: 6.75  Cognition: fully alert and attentive, oriented x 3   ADL's:  Intact   Have you used any form of tobacco in the last 30 days? (Cigarettes, Smokeless Tobacco, Cigars, and/or Pipes): Yes  Has this patient used any form of tobacco in the last 30 days? (Cigarettes, Smokeless Tobacco, Cigars, and/or Pipes) - at patient's request , will prescribe nicotine replacement medication ( nicotine patches)   Mental Status Per Nursing Assessment::   On Admission:     Current Mental Status by Physician: Alert and attentive, well related, pleasant, calm, mood euthymic, full range of affect , no thought disorder, no SI , no HI, no psychotic symptoms, future oriented at this time. No current withdrawal  symptoms  Loss Factors: Disability, chronic pain.  Historical Factors: History of Alcohol dependence, history of depression, no history of suicide attempts   Risk Reduction Factors:   Sense of responsibility to family and Positive coping skills or problem solving skills  Continued Clinical Symptoms:  As noted above, currently much improved - mood and affect improved, no SI, no HI, no ongoing withdrawal symptoms  Cognitive Features That Contribute To Risk:  No gross cognitive deficits noted upon discharge. Is alert , attentive, and oriented x 3   Suicide Risk:  Mild:  Suicidal ideation of limited frequency, intensity, duration, and specificity.  There are no identifiable plans, no associated intent, mild dysphoria and related symptoms, good self-control (both objective and subjective assessment), few other risk factors, and identifiable protective factors, including available and accessible social support.  Principal Problem: Alcohol dependence with withdrawal with complication Discharge Diagnoses:  Patient Active Problem List   Diagnosis Date Noted  . Alcohol dependence with withdrawal with complication [F10.239] 09/02/2014  . Substance induced mood disorder [F19.94] 09/02/2014  . Suicidal ideation [R45.851]   . Alcoholic hepatitis [K70.10] 09/06/2013  . Pneumonia [J18.9] 09/05/2013  . Alcohol abuse [F10.10] 09/05/2013  . Transaminitis [R74.0] 09/05/2013  . Thrombocytopenia, unspecified [D69.6] 09/05/2013  . Chronic pain syndrome [G89.4] 09/05/2013    Follow-up Information    Follow up with [facility] On 10/19/2014.   Why:  9:00AM Dr. Tenny Crawoss for medication management.    Contact information:   [address]  [address] [place], [place] [address] Phone: [phone] Fax: X      Follow up with [facility] On 10/22/2014.   Why:  Appt on this date for therapy at 9:45AM with Dr. Kieth Brightly.    Contact information:   [address] [place], [place] [address] Phone: [phone] Fax: X      Follow up with [facility]   Why:  Referral sent 09/04/14. Office will call you directly to notify you regarding status of referral after discharge.    Contact information:   [address], [address] [place], [place] [address] Phone: [phone] Fax: [phone]      Follow up with [facility].   Why:  Please contact at 9 am to schedule new patient appointment.   Contact information:   [address] [place], [place] [address] Phone: [phone] Fax: [phone]      Plan Of Care/Follow-up recommendations:  Activity:  As tolerated Diet:  Regular Tests:  NA Other:  see below  Is patient on multiple antipsychotic therapies at discharge:  No   Has Patient had three or more failed trials of antipsychotic monotherapy by history:  No  Recommended Plan for Multiple Antipsychotic Therapies: NA  Patient is leaving in good spirits. Plans to return home.  Plans to follow up as above .  Kenza Munar 09/05/2014, 11:56 AM

## 2014-09-05 NOTE — Discharge Summary (Signed)
Physician Discharge Summary Note  Patient:  Frank SequinJoel D Gorczyca is an 44 y.o., male MRN:  [id number] DOB:  1971-06-08 Patient phone:  [phone] (home)  Patient address:   [address] [place] [id number],  Total Time spent with patient: Greater than 30 minutes  Date of Admission:  09/02/2014 Date of Discharge: 09/05/2014  Reason for Admission:  Patient is a 44 year old man, who presented to ED requesting detoxification from alcohol dependence. He had been drinking daily and Heavily. He states he was drinking several beers per day.  He states " I knew that I would not quit unless I got help". He states he has had alcohol withdrawal seizures in the past, so he decided to go to hospital for help. Admit BAL was 316.  Principal Problem: Alcohol dependence with withdrawal with complication Discharge Diagnoses: Patient Active Problem List   Diagnosis Date Noted  . Alcohol dependence with withdrawal with complication [F10.239] 09/02/2014  . Substance induced mood disorder [F19.94] 09/02/2014  . Suicidal ideation [R45.851]   . Alcoholic hepatitis [K70.10] 09/06/2013  . Pneumonia [J18.9] 09/05/2013  . Alcohol abuse [F10.10] 09/05/2013  . Transaminitis [R74.0] 09/05/2013  . Thrombocytopenia, unspecified [D69.6] 09/05/2013  . Chronic pain syndrome [G89.4] 09/05/2013    Musculoskeletal: Strength & Muscle Tone: within normal limits Gait & Station: normal Patient leans: N/A  Psychiatric Specialty Exam:  See Suicide Risk Assessment Physical Exam  Review of Systems  Psychiatric/Behavioral: Positive for substance abuse. Negative for suicidal ideas, hallucinations and memory loss. Depression: Stable. Nervous/anxious: Stable. Insomnia: Stable.     Blood pressure 111/94, pulse 119, temperature 98.4 F (36.9 C), temperature source Oral, resp. rate 18, height 5\' 2"  (1.575 m), weight 61.689 kg (136 lb), SpO2 97 %.Body mass index is 24.87 kg/(m^2).   Past Medical History:  Past Medical History   Diagnosis Date  . Herniated disc   . Nonunion of fracture   . Psoriasis   . Alcohol abuse 09/05/2013  . Alcohol withdrawal 09/05/2013    History of alcohol withdrawal seizures  . Alcoholic hepatitis 09/06/2013  . Pneumonia 09/05/2013  . Thrombocytopenia, unspecified 09/05/2013  . Chronic pain syndrome 09/05/2013    Past Surgical History  Procedure Laterality Date  . Orif femoral shaft fracture w/ plates and screws    . Hernia repair    . Joint replacement     Family History: History reviewed. No pertinent family history. Social History:  History  Alcohol Use  . 100.8 oz/week  . 168 Cans of beer per week    Comment: heavily     History  Drug Use No    History   Social History  . Marital Status: Divorced    Spouse Name: N/A    Number of Children: N/A  . Years of Education: N/A   Social History Main Topics  . Smoking status: Current Every Day Smoker -- 2.00 packs/day for 4 years    Types: Cigarettes  . Smokeless tobacco: Never Used  . Alcohol Use: 100.8 oz/week    168 Cans of beer per week     Comment: heavily  . Drug Use: No  . Sexual Activity: None   Other Topics Concern  . None   Social History Narrative    Past Psychiatric History: Hospitalizations: Denies  Outpatient Care: Denies  Substance Abuse Care: Alcohol  Self-Mutilation: Denies  Suicidal Attempts: Denies  Violent Behaviors: Denies   Risk to Self: Is patient at risk for suicide?: No Risk to Others:   Prior Inpatient  Therapy:   Prior Outpatient Therapy:    Level of Care:  OP  Hospital Course:    MENG WINTERTON was admitted for Alcohol dependence with withdrawal with complication and crisis management.  He was treated with Cymbalta for depression; Neurontin for agitation; Trazodone for insomnia; and Ativan protocol for alcohol detox.  Medical problems were identified and treated as needed.  Home medications were restarted as appropriate.  Improvement was monitored by observation and Frank Page daily report of symptom reduction.  Emotional and mental status was monitored by daily self inventory reports completed by Frank Page and clinical staff.         Frank Page was evaluated by the treatment team for stability and plans for continued recovery upon discharge.  He was offered further treatment options upon discharge including Residential, Intensive Outpatient and Outpatient treatment. He/She will follow up with [facility] for medication management and therapy.     Frank Page motivation was an integral factor for scheduling further treatment.  Employment, transportation, bed availability, health status, family support, and any pending legal issues were also considered during his hospital stay.  Upon completion of this admission the patient was both mentally and medically stable for discharge denying suicidal/homicidal ideation, auditory/visual/tactile hallucinations, delusional thoughts and paranoia.       Consults:  psychiatry  Significant Diagnostic Studies:  labs: CBC/Diff, CMET, UDS, ETOH, Urinalysis  Discharge Vitals:   Blood pressure 111/94, pulse 119, temperature 98.4 F (36.9 C), temperature source Oral, resp. rate 18, height  (1.575 m), weight 61.689 kg (136 lb), SpO2 97 %. Body mass index is 24.87 kg/(m^2). Lab Results:   No results found for this or any previous visit (from the past 72 hour(s)).  Physical Findings: AIMS: Facial and Oral Movements Muscles of Facial Expression: None, normal Lips and Perioral Area: None, normal Jaw: None, normal Tongue: None, normal,Extremity Movements Upper (arms, wrists, hands, fingers): None, normal Lower (legs, knees, ankles, toes): None, normal, Trunk Movements Neck, shoulders, hips: None, normal, Overall Severity Severity of abnormal movements (highest score from questions above): None, normal Incapacitation due to abnormal movements: None, normal Patient's awareness of abnormal  movements (rate only patient's report): No Awareness, Dental Status Current problems with teeth and/or dentures?: No Does patient usually wear dentures?: No  CIWA:  CIWA-Ar Total: 0 COWS:      See Psychiatric Specialty Exam and Suicide Risk Assessment completed by Attending Physician prior to discharge.  Discharge destination:  Home  Is patient on multiple antipsychotic therapies at discharge:  No   Has Patient had three or more failed trials of antipsychotic monotherapy by history:  No    Recommended Plan for Multiple Antipsychotic Therapies: NA     Medication List    Notice    You have not been prescribed any medications.         Follow-up Information    Follow up with [facility] On 10/19/2014.   Why:  9:00AM Dr. Tenny Craw for medication management.    Contact information:   [address] [place], [place] [address] Phone: [phone] Fax: X      Follow up with [facility] On 10/22/2014.   Why:  Appt on this date for therapy at 9:45AM with Dr. Kieth Brightly.    Contact information:   [address] [place], [place] [address] Phone: [phone] Fax: X      Follow up with [facility].   Why:  Referral sent 09/04/14. Office will call you directly to notify you regarding status of referral after discharge.    Contact information:   [address] [place], [place] [address] Phone: [phone] Fax: [phone]      Follow up with [facility].   Why:  Please contact at 9 am to schedule new patient appointment.   Contact information:   [address] [name], [place] [address] Phone: [phone] Fax: [phone]      Follow-up recommendations: Activity:  As tolerated Diet:  As tolerated  Comments:   Patient has been instructed to take medications as prescribed; and report adverse effects to outpatient provider.  Follow up with primary  doctor for any medical issues and If symptoms recur report to nearest emergency or crisis hot line.    Total Discharge Time: Greater than 30 minutes  Signed:  Assunta Found, FNP-BC 09/05/2014, 11:30 AM   Patient seen, Suicide Assessment Completed.  Disposition Plan Reviewed

## 2014-09-05 NOTE — Progress Notes (Signed)
Patient ID: Frank SequinJoel D Page, male   DOB: 06/29/1971, 44 y.o.   MRN: [id number]  Pt. Denies SI/HI and A/V hallucinations. Belongings returned to patient at time of discharge. Patient denies any new onset of pain or discomfort. Discharge instructions and medications were reviewed with patient. Patient verbalized understanding of both medications and discharge instructions. Patient was discharged to lobby where daughter was waiting to pick him up. Q15 minute safety checks maintained until discharge. No distress upon discharge.

## 2014-09-10 NOTE — Progress Notes (Signed)
Patient Discharge Instructions:  After Visit Summary (AVS):   Faxed to:  09/10/14 Discharge Summary Note:   Faxed to:  09/10/14 Psychiatric Admission Assessment Note:   Faxed to:  09/10/14 Suicide Risk Assessment - Discharge Assessment:   Faxed to:  09/10/14 Faxed/Sent to the Next Level Care provider:  09/10/14 Next Level Care Provider Has Access to the EMR, 09/10/14  Faxed to [facility] [place] @ [phone] [facility] @ [phone] Records [facility] via CH[facility]/Epic access.  Jerelene ReddenSheena E Brimfield, 09/10/2014, 1:43 PM

## 2014-10-09 DIAGNOSIS — G8929 Other chronic pain: Secondary | ICD-10-CM | POA: Diagnosis not present

## 2014-10-19 ENCOUNTER — Ambulatory Visit (HOSPITAL_COMMUNITY): Payer: Self-pay | Admitting: Psychiatry

## 2014-10-22 ENCOUNTER — Encounter (HOSPITAL_COMMUNITY): Payer: Self-pay | Admitting: Psychology

## 2014-10-22 ENCOUNTER — Ambulatory Visit (INDEPENDENT_AMBULATORY_CARE_PROVIDER_SITE_OTHER): Payer: Medicare Other | Admitting: Psychology

## 2014-10-22 DIAGNOSIS — F332 Major depressive disorder, recurrent severe without psychotic features: Secondary | ICD-10-CM

## 2014-10-22 DIAGNOSIS — G894 Chronic pain syndrome: Secondary | ICD-10-CM
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
## 2014-11-14 DIAGNOSIS — G541 Lumbosacral plexus disorders: Secondary | ICD-10-CM | POA: Diagnosis not present

## 2014-11-14 DIAGNOSIS — M25551 Pain in right hip: Secondary | ICD-10-CM | POA: Diagnosis not present

## 2014-11-14 DIAGNOSIS — M79661 Pain in right lower leg: Secondary | ICD-10-CM | POA: Diagnosis not present

## 2014-11-14 DIAGNOSIS — M25561 Pain in right knee: Secondary | ICD-10-CM | POA: Diagnosis not present

## 2014-11-14 DIAGNOSIS — M79621 Pain in right upper arm: Secondary | ICD-10-CM | POA: Diagnosis not present

## 2014-11-14 DIAGNOSIS — G603 Idiopathic progressive neuropathy: Secondary | ICD-10-CM | POA: Diagnosis not present

## 2014-11-14 DIAGNOSIS — G8929 Other chronic pain: Secondary | ICD-10-CM | POA: Diagnosis not present

## 2014-11-14 DIAGNOSIS — M542 Cervicalgia: Secondary | ICD-10-CM | POA: Diagnosis not present

## 2014-11-14 DIAGNOSIS — M545 Low back pain: Secondary | ICD-10-CM | POA: Diagnosis not present

## 2014-11-14 DIAGNOSIS — M546 Pain in thoracic spine: Secondary | ICD-10-CM | POA: Diagnosis not present

## 2014-11-14 DIAGNOSIS — M25552 Pain in left hip: Secondary | ICD-10-CM | POA: Diagnosis not present

## 2014-11-15 DIAGNOSIS — M545 Low back pain: Secondary | ICD-10-CM | POA: Diagnosis not present

## 2014-11-21 ENCOUNTER — Ambulatory Visit (INDEPENDENT_AMBULATORY_CARE_PROVIDER_SITE_OTHER): Payer: Medicare Other | Admitting: Psychology

## 2014-11-21 DIAGNOSIS — F332 Major depressive disorder, recurrent severe without psychotic features: Secondary | ICD-10-CM

## 2014-11-21 DIAGNOSIS — G894 Chronic pain syndrome: Secondary | ICD-10-CM | POA: Diagnosis not present

## 2014-11-22 ENCOUNTER — Ambulatory Visit (HOSPITAL_COMMUNITY): Payer: Self-pay | Admitting: Psychiatry

## 2014-12-14 ENCOUNTER — Ambulatory Visit (HOSPITAL_COMMUNITY): Payer: Self-pay | Admitting: Psychology

## 2015-01-23 ENCOUNTER — Encounter (HOSPITAL_COMMUNITY): Payer: Self-pay | Admitting: Psychology
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
## 2015-05-23 DIAGNOSIS — M545 Low back pain: Secondary | ICD-10-CM | POA: Diagnosis not present

## 2015-05-23 DIAGNOSIS — F5101 Primary insomnia: Secondary | ICD-10-CM | POA: Diagnosis not present

## 2015-06-04 DIAGNOSIS — H11051 Peripheral pterygium, progressive, right eye: Secondary | ICD-10-CM | POA: Diagnosis not present

## 2015-08-27 DIAGNOSIS — T148 Other injury of unspecified body region: Secondary | ICD-10-CM | POA: Diagnosis not present

## 2015-08-27 DIAGNOSIS — M549 Dorsalgia, unspecified: Secondary | ICD-10-CM | POA: Diagnosis not present

## 2015-08-27 DIAGNOSIS — S0990XA Unspecified injury of head, initial encounter: Secondary | ICD-10-CM | POA: Diagnosis not present

## 2015-08-27 DIAGNOSIS — R52 Pain, unspecified: Secondary | ICD-10-CM | POA: Diagnosis not present

## 2015-08-27 DIAGNOSIS — S199XXA Unspecified injury of neck, initial encounter: Secondary | ICD-10-CM | POA: Diagnosis not present

## 2015-09-17 DIAGNOSIS — L03818 Cellulitis of other sites: Secondary | ICD-10-CM | POA: Diagnosis not present

## 2015-10-15 DIAGNOSIS — J3489 Other specified disorders of nose and nasal sinuses: Secondary | ICD-10-CM | POA: Diagnosis not present

## 2015-10-15 DIAGNOSIS — R600 Localized edema: Secondary | ICD-10-CM | POA: Diagnosis not present

## 2016-01-17 DIAGNOSIS — M7062 Trochanteric bursitis, left hip: Secondary | ICD-10-CM | POA: Diagnosis not present

## 2016-01-17 DIAGNOSIS — Z0000 Encounter for general adult medical examination without abnormal findings: Secondary | ICD-10-CM | POA: Diagnosis not present

## 2016-01-17 DIAGNOSIS — Z13220 Encounter for screening for lipoid disorders: Secondary | ICD-10-CM | POA: Diagnosis not present

## 2016-05-05 DIAGNOSIS — F411 Generalized anxiety disorder: Secondary | ICD-10-CM | POA: Diagnosis not present

## 2016-05-05 DIAGNOSIS — F3289 Other specified depressive episodes: Secondary | ICD-10-CM | POA: Diagnosis not present

## 2016-05-05 DIAGNOSIS — M542 Cervicalgia: Secondary | ICD-10-CM | POA: Diagnosis not present

## 2016-05-05 DIAGNOSIS — M79604 Pain in right leg: Secondary | ICD-10-CM | POA: Diagnosis not present

## 2016-08-28 DIAGNOSIS — Z79899 Other long term (current) drug therapy: Secondary | ICD-10-CM | POA: Diagnosis not present

## 2016-08-28 DIAGNOSIS — F411 Generalized anxiety disorder: Secondary | ICD-10-CM | POA: Diagnosis not present

## 2016-08-28 DIAGNOSIS — M79604 Pain in right leg: Secondary | ICD-10-CM | POA: Diagnosis not present

## 2016-08-28 DIAGNOSIS — M542 Cervicalgia: Secondary | ICD-10-CM | POA: Diagnosis not present

## 2016-08-28 DIAGNOSIS — F3289 Other specified depressive episodes: Secondary | ICD-10-CM | POA: Diagnosis not present

## 2016-11-27 DIAGNOSIS — M542 Cervicalgia: Secondary | ICD-10-CM | POA: Diagnosis not present

## 2016-11-27 DIAGNOSIS — F3289 Other specified depressive episodes: Secondary | ICD-10-CM | POA: Diagnosis not present

## 2016-11-27 DIAGNOSIS — M79604 Pain in right leg: Secondary | ICD-10-CM | POA: Diagnosis not present

## 2016-11-27 DIAGNOSIS — F411 Generalized anxiety disorder: Secondary | ICD-10-CM | POA: Diagnosis not present

## 2017-02-26 DIAGNOSIS — L84 Corns and callosities: Secondary | ICD-10-CM | POA: Diagnosis not present

## 2017-02-26 DIAGNOSIS — M545 Low back pain: Secondary | ICD-10-CM | POA: Diagnosis not present

## 2017-02-26 DIAGNOSIS — F3289 Other specified depressive episodes: Secondary | ICD-10-CM | POA: Diagnosis not present

## 2017-02-26 DIAGNOSIS — G603 Idiopathic progressive neuropathy: Secondary | ICD-10-CM | POA: Diagnosis not present

## 2017-02-26 DIAGNOSIS — F411 Generalized anxiety disorder: Secondary | ICD-10-CM | POA: Diagnosis not present

## 2017-09-07 DIAGNOSIS — G603 Idiopathic progressive neuropathy: Secondary | ICD-10-CM | POA: Diagnosis not present

## 2017-09-07 DIAGNOSIS — F3289 Other specified depressive episodes: Secondary | ICD-10-CM | POA: Diagnosis not present

## 2017-09-07 DIAGNOSIS — M545 Low back pain: Secondary | ICD-10-CM | POA: Diagnosis not present

## 2017-09-07 DIAGNOSIS — Z1389 Encounter for screening for other disorder: Secondary | ICD-10-CM | POA: Diagnosis not present

## 2017-09-07 DIAGNOSIS — F411 Generalized anxiety disorder: Secondary | ICD-10-CM | POA: Diagnosis not present

## 2017-09-07 DIAGNOSIS — Z0000 Encounter for general adult medical examination without abnormal findings: Secondary | ICD-10-CM | POA: Diagnosis not present

## 2017-10-05 DIAGNOSIS — G8929 Other chronic pain: Secondary | ICD-10-CM | POA: Diagnosis not present

## 2017-10-05 DIAGNOSIS — M79604 Pain in right leg: Secondary | ICD-10-CM | POA: Diagnosis not present

## 2017-10-05 DIAGNOSIS — I10 Essential (primary) hypertension: Secondary | ICD-10-CM | POA: Diagnosis not present

## 2017-10-05 DIAGNOSIS — F17200 Nicotine dependence, unspecified, uncomplicated: Secondary | ICD-10-CM | POA: Diagnosis not present

## 2017-10-05 DIAGNOSIS — M79661 Pain in right lower leg: Secondary | ICD-10-CM | POA: Diagnosis not present

## 2017-10-06 DIAGNOSIS — M79604 Pain in right leg: Secondary | ICD-10-CM | POA: Diagnosis not present

## 2017-10-29 ENCOUNTER — Ambulatory Visit (INDEPENDENT_AMBULATORY_CARE_PROVIDER_SITE_OTHER): Payer: Medicare Other | Admitting: Urology

## 2017-10-29 DIAGNOSIS — N5201 Erectile dysfunction due to arterial insufficiency: Secondary | ICD-10-CM

## 2017-10-29 DIAGNOSIS — N486 Induration penis plastica: Secondary | ICD-10-CM

## 2017-10-29 DIAGNOSIS — R6882 Decreased libido: Secondary | ICD-10-CM

## 2017-10-29 DIAGNOSIS — R350 Frequency of micturition: Secondary | ICD-10-CM | POA: Diagnosis not present

## 2017-10-29 DIAGNOSIS — R530 Neoplastic (malignant) related fatigue: Secondary | ICD-10-CM | POA: Diagnosis not present

## 2017-12-17 ENCOUNTER — Ambulatory Visit (INDEPENDENT_AMBULATORY_CARE_PROVIDER_SITE_OTHER): Payer: Medicare Other | Admitting: Urology

## 2017-12-17 DIAGNOSIS — N486 Induration penis plastica: Secondary | ICD-10-CM

## 2017-12-20 DIAGNOSIS — F3289 Other specified depressive episodes: Secondary | ICD-10-CM | POA: Diagnosis not present

## 2017-12-20 DIAGNOSIS — M545 Low back pain: Secondary | ICD-10-CM | POA: Diagnosis not present

## 2017-12-20 DIAGNOSIS — G603 Idiopathic progressive neuropathy: Secondary | ICD-10-CM | POA: Diagnosis not present

## 2017-12-20 DIAGNOSIS — F411 Generalized anxiety disorder: Secondary | ICD-10-CM | POA: Diagnosis not present

## 2018-03-23 ENCOUNTER — Ambulatory Visit: Payer: Self-pay | Admitting: Urology

## 2018-06-13 DIAGNOSIS — G603 Idiopathic progressive neuropathy: Secondary | ICD-10-CM | POA: Diagnosis not present

## 2018-06-13 DIAGNOSIS — F411 Generalized anxiety disorder: Secondary | ICD-10-CM | POA: Diagnosis not present

## 2018-06-13 DIAGNOSIS — F3289 Other specified depressive episodes: Secondary | ICD-10-CM | POA: Diagnosis not present

## 2018-06-13 DIAGNOSIS — M545 Low back pain: Secondary | ICD-10-CM | POA: Diagnosis not present

## 2018-11-02 DIAGNOSIS — H671 Otitis media in diseases classified elsewhere, right ear: Secondary | ICD-10-CM | POA: Diagnosis not present

## 2018-11-02 DIAGNOSIS — Z1389 Encounter for screening for other disorder: Secondary | ICD-10-CM | POA: Diagnosis not present

## 2018-11-02 DIAGNOSIS — F411 Generalized anxiety disorder: Secondary | ICD-10-CM | POA: Diagnosis not present

## 2018-11-02 DIAGNOSIS — F3289 Other specified depressive episodes: Secondary | ICD-10-CM | POA: Diagnosis not present

## 2018-11-02 DIAGNOSIS — Z0000 Encounter for general adult medical examination without abnormal findings: Secondary | ICD-10-CM | POA: Diagnosis not present

## 2018-11-02 DIAGNOSIS — M545 Low back pain: Secondary | ICD-10-CM | POA: Diagnosis not present

## 2020-07-11 ENCOUNTER — Emergency Department (HOSPITAL_COMMUNITY): Payer: Medicare Other

## 2020-07-11 ENCOUNTER — Other Ambulatory Visit: Payer: Self-pay

## 2020-07-11 ENCOUNTER — Emergency Department (HOSPITAL_COMMUNITY)
Admission: EM | Admit: 2020-07-11 | Discharge: 2020-07-12 | Disposition: A | Discharge status: Home / Self Care | Payer: Medicare Other | Attending: Emergency Medicine | Admitting: Emergency Medicine

## 2020-07-11 DIAGNOSIS — Z96659 Presence of unspecified artificial knee joint: Secondary | ICD-10-CM | POA: Insufficient documentation

## 2020-07-11 DIAGNOSIS — K760 Fatty (change of) liver, not elsewhere classified: Secondary | ICD-10-CM | POA: Diagnosis not present

## 2020-07-11 DIAGNOSIS — R1010 Upper abdominal pain, unspecified: Secondary | ICD-10-CM | POA: Insufficient documentation

## 2020-07-11 DIAGNOSIS — R110 Nausea: Secondary | ICD-10-CM | POA: Insufficient documentation

## 2020-07-11 DIAGNOSIS — K2920 Alcoholic gastritis without bleeding: Secondary | ICD-10-CM

## 2020-07-11 DIAGNOSIS — F17210 Nicotine dependence, cigarettes, uncomplicated: Secondary | ICD-10-CM | POA: Diagnosis not present

## 2020-07-11 LAB — COMPREHENSIVE METABOLIC PANEL
ALT: 33 U/L (ref 0–44)
AST: 23 U/L (ref 15–41)
Albumin: 3.9 g/dL (ref 3.5–5.0)
Alkaline Phosphatase: 72 U/L (ref 38–126)
Anion gap: 8 (ref 5–15)
BUN: 6 mg/dL (ref 6–20)
CO2: 26 mmol/L (ref 22–32)
Calcium: 9.5 mg/dL (ref 8.9–10.3)
Chloride: 100 mmol/L (ref 98–111)
Creatinine, Ser: 0.7 mg/dL (ref 0.61–1.24)
GFR, Estimated: >60 mL/min (ref >60)
Glucose, Bld: 100 mg/dL — ABNORMAL HIGH (ref 70–99)
Potassium: 3.1 mmol/L — ABNORMAL LOW (ref 3.5–5.1)
Sodium: 134 mmol/L — ABNORMAL LOW (ref 135–145)
Total Bilirubin: 0.9 mg/dL (ref 0.3–1.2)
Total Protein: 7.3 g/dL (ref 6.5–8.1)

## 2020-07-11 LAB — CBC
HCT: 47.5 % (ref 39.0–52.0)
Hemoglobin: 16.7 g/dL (ref 13.0–17.0)
MCH: 32.1 pg (ref 26.0–34.0)
MCHC: 35.2 g/dL (ref 30.0–36.0)
MCV: 91.2 fL (ref 80.0–100.0)
Platelets: 225 10*3/uL (ref 150–400)
RBC: 5.21 MIL/uL (ref 4.22–5.81)
RDW: 12.9 % (ref 11.5–15.5)
WBC: 8.7 10*3/uL (ref 4.0–10.5)
nRBC: 0 % (ref 0.0–0.2)

## 2020-07-11 LAB — URINALYSIS, ROUTINE W REFLEX MICROSCOPIC
Bilirubin Urine: NEGATIVE
Glucose, UA: NEGATIVE mg/dL
Hgb urine dipstick: NEGATIVE
Ketones, ur: NEGATIVE mg/dL
Leukocytes,Ua: NEGATIVE
Nitrite: NEGATIVE
Protein, ur: NEGATIVE mg/dL
Specific Gravity, Urine: 1.005 (ref 1.005–1.030)
pH: 7 (ref 5.0–8.0)

## 2020-07-11 LAB — LIPASE, BLOOD: Lipase: 26 U/L (ref 11–51)

## 2020-07-11 IMAGING — CT CT ABD-PELV W/ CM
2 of 5 series · 16 of 46 positions shown, 18 images · IV contrast (Omnipaque or Isovue)
Comparison: None.

CLINICAL DATA: Upper abdominal epigastric pain

EXAM:
CT ABDOMEN AND PELVIS WITH CONTRAST
TECHNIQUE: Multidetector CT imaging of the abdomen and pelvis was performed
using the standard protocol following bolus administration of
intravenous contrast.
CONTRAST:  100mL OMNIPAQUE IOHEXOL 300 MG/ML  SOLN

[Series 2: axial st · axial · 0.61mm/px · z∈[+1012,+1362]mm · 13 of 82 slices shown, 15 images]
[im 6/82  soft-tissue]
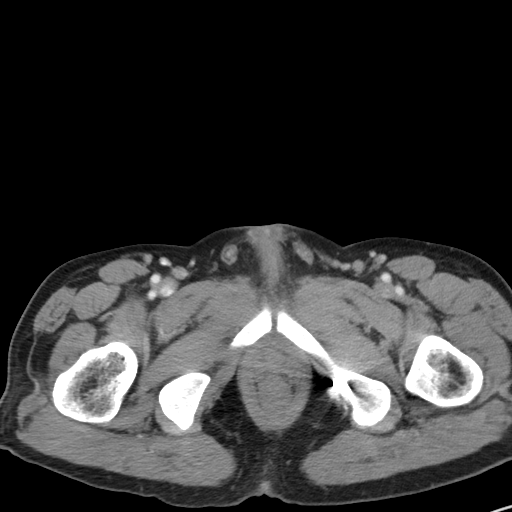
[im 6/82  bone]
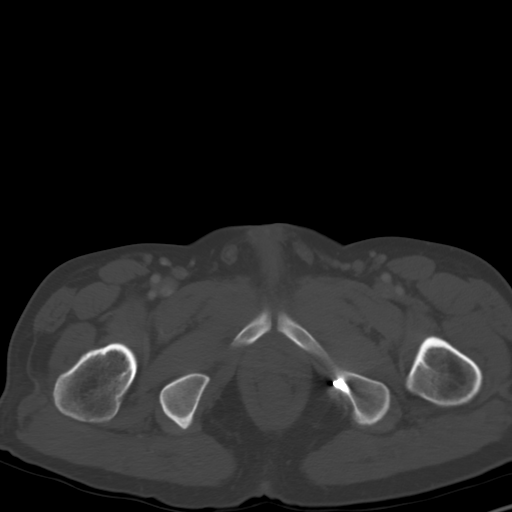
[im 11/82  soft-tissue]
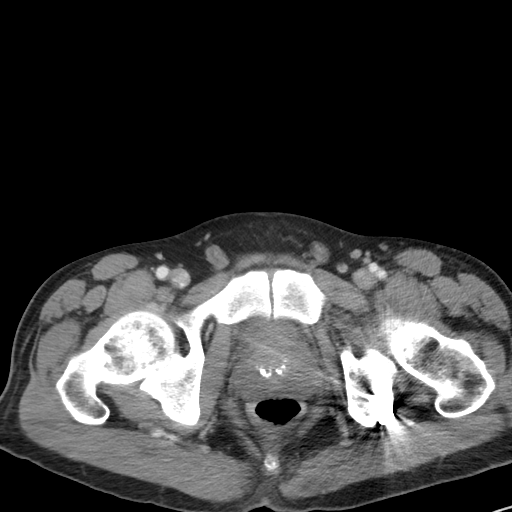
[im 16/82  soft-tissue]
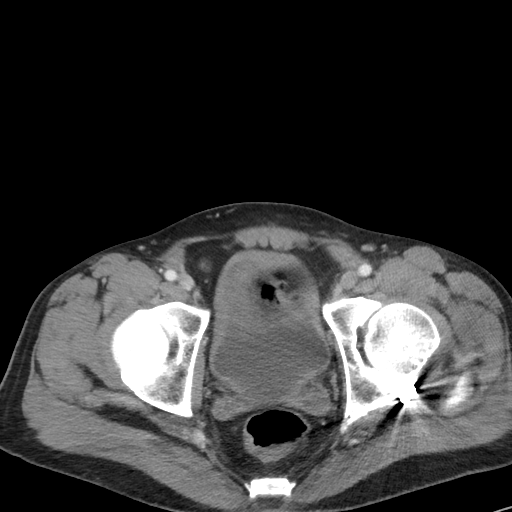
[im 26/82  soft-tissue]
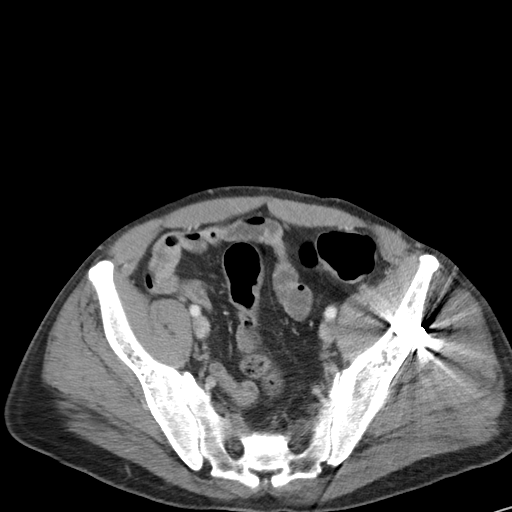
[im 31/82  soft-tissue]
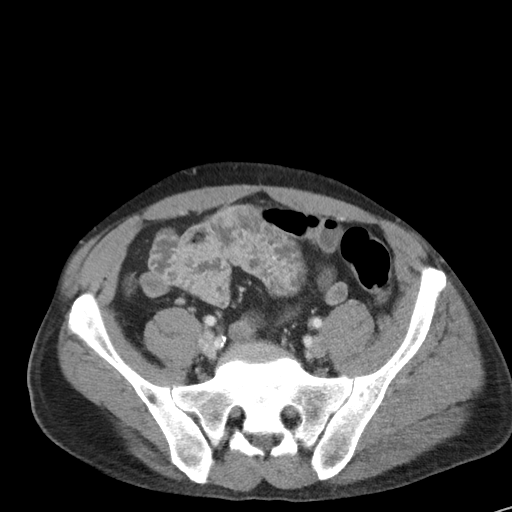
[im 36/82  soft-tissue]
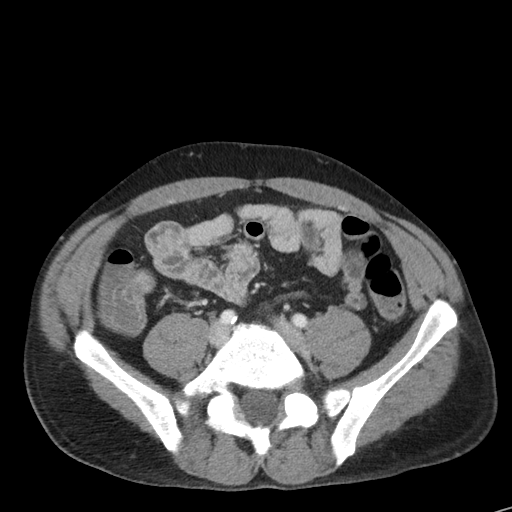
[im 41/82  soft-tissue]
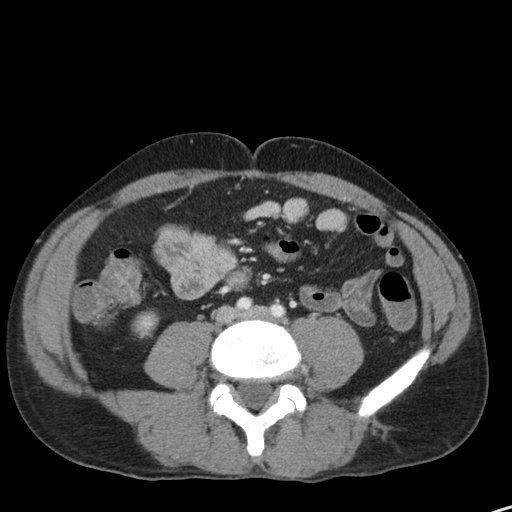
[im 46/82  soft-tissue]
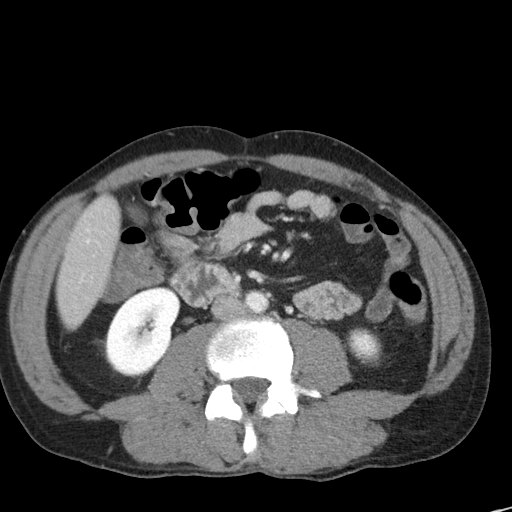
[im 51/82  soft-tissue]
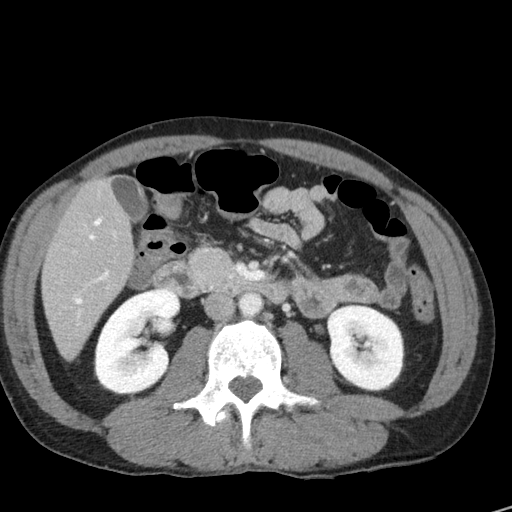
[im 51/82  bone]
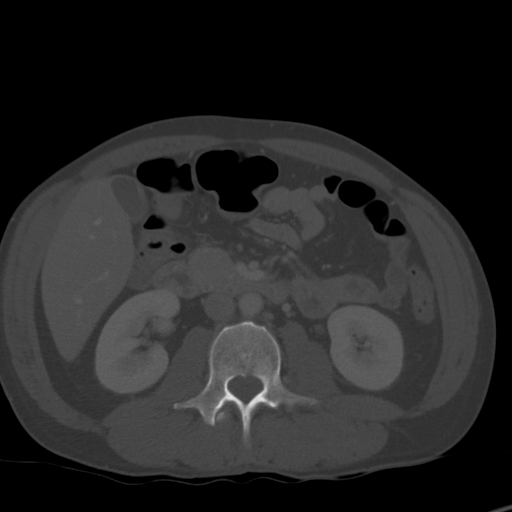
[im 56/82  soft-tissue]
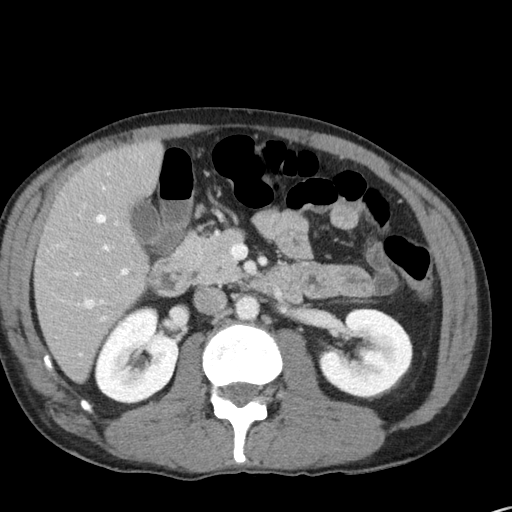
[im 66/82  soft-tissue]
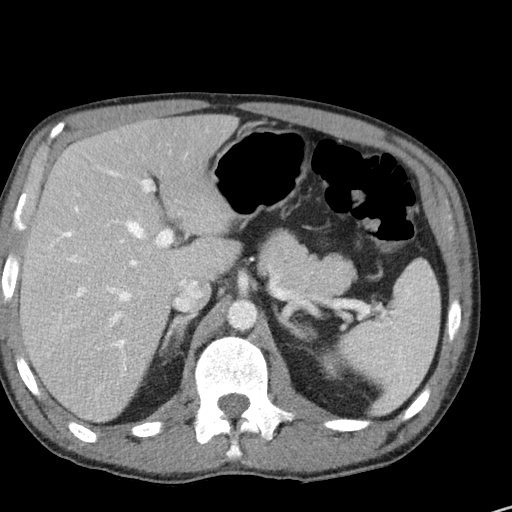
[im 71/82  soft-tissue]
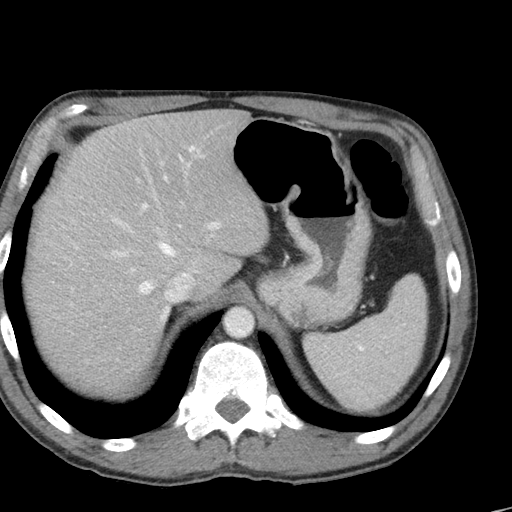
[im 76/82  soft-tissue]
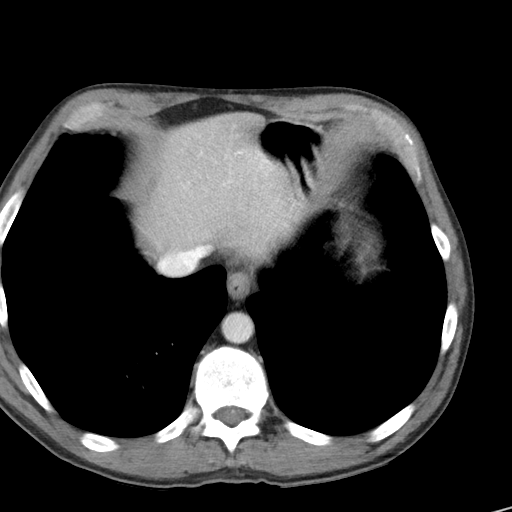

[Series 5: coronal st · coronal · 0.66mm/px · 3 of 76 slices shown]
[im 26/76  soft-tissue]
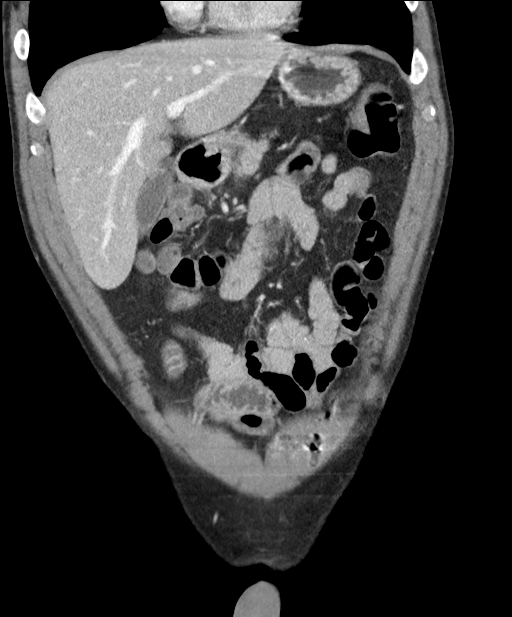
[im 34/76  soft-tissue]
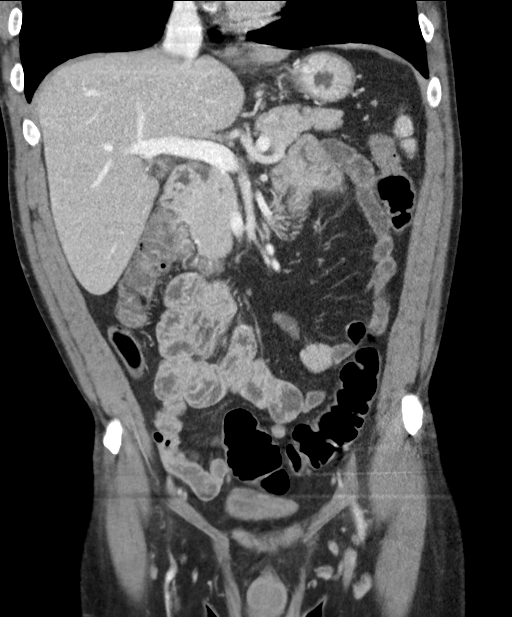
[im 42/76  soft-tissue]
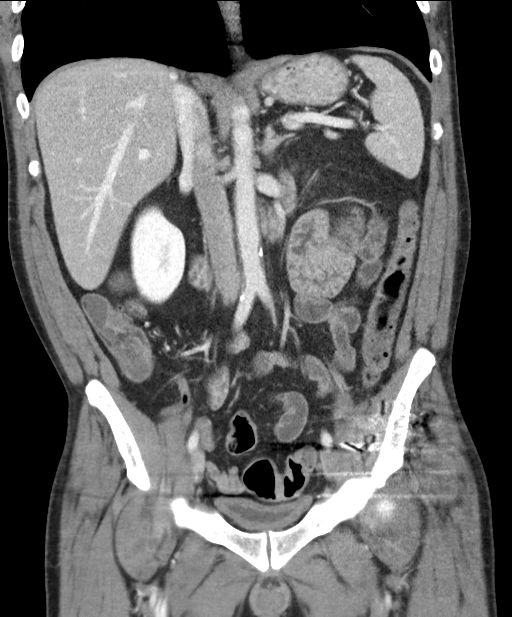

[16 of 46 positions shown; findings below may reference images not displayed]

FINDINGS: Lower chest: The visualized heart size within normal limits. No
pericardial fluid/thickening.

No hiatal hernia.

The visualized portions of the lungs are clear.

Hepatobiliary: There is a tiny low-density lesion seen within the
posterior right liver lobe measuring 5 mm. There is fatty
infiltration of the liver parenchyma. Main portal vein is patent. No
evidence of calcified gallstones, gallbladder wall thickening or
biliary dilatation.

Pancreas: Unremarkable. No pancreatic ductal dilatation or
surrounding inflammatory changes.

Spleen: Normal in size without focal abnormality.

Adrenals/Urinary Tract: Both adrenal glands appear normal. The
kidneys and collecting system appear normal without evidence of
urinary tract calculus or hydronephrosis. Bladder is unremarkable.

Stomach/Bowel: The stomach, small bowel, and colon are normal in
appearance. No inflammatory changes, wall thickening, or obstructive
findings.The appendix is normal.

Vascular/Lymphatic: There are no enlarged mesenteric,
retroperitoneal, or pelvic lymph nodes. Scattered aortic
atherosclerosis is noted.

Reproductive: The uterus and adnexa are unremarkable.

Other: No evidence of abdominal wall mass or hernia.

Musculoskeletal: No acute or significant osseous findings. Prior
ORIF of the posterior left acetabulum is noted.
IMPRESSION: No acute intra-abdominal or pelvic pathology to explain the
patient's symptoms. Hepatic steatosis

Aortic Atherosclerosis ([D0]-[D0]).

## 2020-07-11 MED ORDER — LIDOCAINE VISCOUS HCL 2 % MT SOLN
15.0000 mL | Freq: Once | OROMUCOSAL | Status: AC
  Administered 2020-07-11: 15 mL via ORAL
  Filled 2020-07-11: qty 15

## 2020-07-11 MED ORDER — IOHEXOL 300 MG/ML  SOLN
100.0000 mL | Freq: Once | INTRAMUSCULAR | Status: AC | PRN
  Administered 2020-07-12: 100 mL via INTRAVENOUS

## 2020-07-11 MED ORDER — ALUM & MAG HYDROXIDE-SIMETH 200-200-20 MG/5ML PO SUSP
30.0000 mL | Freq: Once | ORAL | Status: AC
  Administered 2020-07-11: 30 mL via ORAL
  Filled 2020-07-11: qty 30

## 2020-07-11 NOTE — ED Provider Notes (Signed)
[facility] EMERGENCY DEPARTMENT Provider Note   CSN: [id number] Arrival date & time: 07/11/20  1654     History No chief complaint on file.   Frank Page is a 49 y.o. male.  HPI Patient presents with upper abdominal pain.  Has had since this last weekend and today is Thursday.  Worse with eating.  Somewhat crampy and sharp.  Some nausea but no vomiting or diarrhea.  Sometimes worse with certain movements.  States he is still drinking alcohol.  No history of pancreatitis.  No weight loss.  States the pain is severe.  Patient's daughter works in the ER.  States his stool is been a little different but not necessarily diarrhea or constipation.    Past Medical History:  Diagnosis Date  . Alcohol abuse 09/05/2013  . Alcohol withdrawal 09/05/2013   History of alcohol withdrawal seizures  . Alcoholic hepatitis 09/06/2013  . Chronic pain syndrome 09/05/2013  . Herniated disc   . Nonunion of fracture   . Pneumonia 09/05/2013  . Psoriasis   . Thrombocytopenia, unspecified 09/05/2013    Patient Active Problem List   Diagnosis Date Noted  . Alcohol dependence with withdrawal with complication (HCC) 09/02/2014  . Substance induced mood disorder (HCC) 09/02/2014  . Suicidal ideation   . Alcoholic hepatitis 09/06/2013  . Pneumonia 09/05/2013  . Alcohol abuse 09/05/2013  . Transaminitis 09/05/2013  . Thrombocytopenia, unspecified (HCC) 09/05/2013  . Chronic pain syndrome 09/05/2013    Past Surgical History:  Procedure Laterality Date  . HERNIA REPAIR    . JOINT REPLACEMENT    . ORIF FEMORAL SHAFT FRACTURE W/ PLATES AND SCREWS         No family history on file.  Social History   Tobacco Use  . Smoking status: Current Every Day Smoker    Packs/day: 2.00    Years: 4.00    Pack years: 8.00    Types: Cigarettes  . Smokeless tobacco: Never Used  Substance Use Topics  . Alcohol use: Yes    Alcohol/week: 168.0 standard drinks    Types: 168 Cans of beer per week    Comment:  heavily  . Drug use: No    Home Medications Prior to Admission medications   Medication Sig Start Date End Date Taking? Authorizing Provider  DULoxetine (CYMBALTA) 30 MG capsule Take 1 capsule (30 mg total) by mouth daily. For depression 09/05/14   Rankin, Shuvon B, NP  gabapentin (NEURONTIN) 100 MG capsule Take 2 capsules (200 mg total) by mouth 2 (two) times daily. For agitation 09/05/14   Rankin, Shuvon B, NP  traZODone (DESYREL) 150 MG tablet Take 1 tablet (150 mg total) by mouth at bedtime as needed for sleep. 09/05/14   Rankin, Shuvon B, NP    Allergies    Penicillins  Review of Systems   Review of Systems  Constitutional: Positive for appetite change.  HENT: Negative for congestion.   Respiratory: Negative for shortness of breath.   Cardiovascular: Negative for chest pain.  Gastrointestinal: Positive for abdominal pain and nausea. Negative for constipation, diarrhea and vomiting.  Genitourinary: Negative for flank pain.  Musculoskeletal: Positive for back pain.  Skin: Negative for rash.  Neurological: Negative for weakness.  Psychiatric/Behavioral: Negative for confusion.    Physical Exam Updated Vital Signs BP (!) 130/93 (BP Location: Right Arm)   Pulse 88   Temp 98.3 F (36.8 C) (Oral)   Resp 20   Ht 5\' 2"  (1.575 m)   Wt 58.2 kg  SpO2 95%   BMI 23.47 kg/m   Physical Exam Vitals and nursing note reviewed.  HENT:     Head: Atraumatic.     Mouth/Throat:     Mouth: Mucous membranes are moist.  Eyes:     General: No scleral icterus.    Extraocular Movements: Extraocular movements intact.     Pupils: Pupils are equal, round, and reactive to light.  Cardiovascular:     Rate and Rhythm: Regular rhythm.     Pulses: Normal pulses.  Pulmonary:     Breath sounds: Normal breath sounds.  Abdominal:     Comments: Epigastric tenderness without rebound or guarding.  No hernias palpated.  Musculoskeletal:     Cervical back: Neck supple.     Right lower leg: No edema.      Left lower leg: No edema.  Skin:    General: Skin is warm.     Capillary Refill: Capillary refill takes less than 2 seconds.  Neurological:     Mental Status: He is alert and oriented to person, place, and time.  Psychiatric:        Mood and Affect: Mood normal.     ED Results / Procedures / Treatments   Labs (all labs ordered are listed, but only abnormal results are displayed) Labs Reviewed  COMPREHENSIVE METABOLIC PANEL - Abnormal; Notable for the following components:      Result Value   Sodium 134 (*)    Potassium 3.1 (*)    Glucose, Bld 100 (*)    All other components within normal limits  LIPASE, BLOOD  CBC  URINALYSIS, ROUTINE W REFLEX MICROSCOPIC    EKG None  Radiology No results found.  Procedures Procedures (including critical care time)  Medications Ordered in ED Medications  alum & mag hydroxide-simeth (MAALOX/MYLANTA) 200-200-20 MG/5ML suspension 30 mL (30 mLs Oral Given 07/11/20 2304)    And  lidocaine (XYLOCAINE) 2 % viscous mouth solution 15 mL (15 mLs Oral Given 07/11/20 2304)    ED Course  I have reviewed the triage vital signs and the nursing notes.  Pertinent labs & imaging results that were available during my care of the patient were reviewed by me and considered in my medical decision making (see chart for details).    MDM Rules/Calculators/A&P                          Patient epigastric abdominal pain.  Worse with eating.  Lab work reassuring.  Does drink alcohol.  Gastritis considered.  Differential diagnosis includes but is not limited to biliary colic pancreatitis and gastritis.  CT scan pending.  Care turned over to Dr. Blinda Leatherwood. Final Clinical Impression(s) / ED Diagnoses Final diagnoses:  None    Rx / DC Orders ED Discharge Orders    None       Benjiman Core, MD 07/11/20 2305

## 2020-07-11 NOTE — ED Triage Notes (Signed)
Pt reports upper abd pain x 3 days. Denies N/V/D.

## 2020-07-12 DIAGNOSIS — K760 Fatty (change of) liver, not elsewhere classified: Secondary | ICD-10-CM | POA: Diagnosis not present

## 2020-07-12 MED ORDER — PANTOPRAZOLE SODIUM 40 MG PO TBEC: 40.0000 mg | DELAYED_RELEASE_TABLET | Freq: Every day | ORAL | 3 refills | Status: DC

## 2020-07-12 MED ORDER — SUCRALFATE 1 G PO TABS: 1.0000 g | ORAL_TABLET | Freq: Three times a day (TID) | ORAL | 0 refills | Status: DC

## 2020-07-12 MED ORDER — DICYCLOMINE HCL 20 MG PO TABS: 20.0000 mg | ORAL_TABLET | Freq: Three times a day (TID) | ORAL | 0 refills | Status: DC

## 2020-07-12 NOTE — ED Provider Notes (Signed)
Patient signed out to me by Dr. Rubin Payor to follow-up on CT scan.  Patient presented with moderate to severe epigastric pain.  Patient does admit to frequent binge alcohol intake.  His lab work was unremarkable including a normal lipase and white blood cell count with normal hemoglobin.  He has not had any melena.  CT scan is unremarkable.  Patient likely suffering from gastritis, will treat empirically.  Patient does not ever have any withdrawal symptoms from alcohol, does not require additional medication to stop drinking.  Was given resources for outpatient help for his drinking.   Gilda Crease, MD 07/12/20 [phone]

## 2020-08-01 ENCOUNTER — Encounter: Payer: Self-pay | Admitting: Internal Medicine

## 2020-09-05 NOTE — Progress Notes (Deleted)
Referring Provider: Wilmon Pali, FNP Primary Care Physician:  Wilmon Pali, FNP Primary Gastroenterologist:  Dr. Marletta Lor  No chief complaint on file.   HPI:   Frank Page is a 50 y.o. male presenting today at the request of Wilmon Pali, FNP for consult colonoscopy.  Office visit due to alcohol.    Past Medical History:  Diagnosis Date  . Alcohol abuse 09/05/2013  . Alcohol withdrawal (HCC) 09/05/2013   History of alcohol withdrawal seizures  . Alcoholic hepatitis 09/06/2013  . Chronic pain syndrome 09/05/2013  . Herniated disc   . Nonunion of fracture   . Pneumonia 09/05/2013  . Psoriasis   . Thrombocytopenia, unspecified (HCC) 09/05/2013    Past Surgical History:  Procedure Laterality Date  . HERNIA REPAIR    . JOINT REPLACEMENT    . ORIF FEMORAL SHAFT FRACTURE W/ PLATES AND SCREWS      Current Outpatient Medications  Medication Sig Dispense Refill  . dicyclomine (BENTYL) 20 MG tablet Take 1 tablet (20 mg total) by mouth 3 (three) times daily before meals. 30 tablet 0  . DULoxetine (CYMBALTA) 30 MG capsule Take 1 capsule (30 mg total) by mouth daily. For depression 30 capsule 0  . gabapentin (NEURONTIN) 100 MG capsule Take 2 capsules (200 mg total) by mouth 2 (two) times daily. For agitation 60 capsule 0  . pantoprazole (PROTONIX) 40 MG tablet Take 1 tablet (40 mg total) by mouth daily. 30 tablet 3  . sucralfate (CARAFATE) 1 g tablet Take 1 tablet (1 g total) by mouth 4 (four) times daily -  with meals and at bedtime. 40 tablet 0  . traZODone (DESYREL) 150 MG tablet Take 1 tablet (150 mg total) by mouth at bedtime as needed for sleep. 30 tablet 0   No current facility-administered medications for this visit.    Allergies as of 09/06/2020 - Review Complete 01/23/2015  Allergen Reaction Noted  . Penicillins Anaphylaxis 09/05/2013    No family history on file.  Social History   Socioeconomic History  . Marital status: Divorced    Spouse name: Not on file   . Number of children: Not on file  . Years of education: Not on file  . Highest education level: Not on file  Occupational History  . Not on file  Tobacco Use  . Smoking status: Current Every Day Smoker    Packs/day: 2.00    Years: 4.00    Pack years: 8.00    Types: Cigarettes  . Smokeless tobacco: Never Used  Substance and Sexual Activity  . Alcohol use: Yes    Alcohol/week: 168.0 standard drinks    Types: 168 Cans of beer per week    Comment: heavily  . Drug use: No  . Sexual activity: Not on file  Other Topics Concern  . Not on file  Social History Narrative  . Not on file   Social Determinants of Health   Financial Resource Strain: Not on file  Food Insecurity: Not on file  Transportation Needs: Not on file  Physical Activity: Not on file  Stress: Not on file  Social Connections: Not on file  Intimate Partner Violence: Not on file    Review of Systems: Gen: Denies any fever, chills, fatigue, weight loss, lack of appetite.  CV: Denies chest pain, heart palpitations, peripheral edema, syncope.  Resp: Denies shortness of breath at rest or with exertion. Denies wheezing or cough.  GI: Denies dysphagia or odynophagia. Denies jaundice, hematemesis, fecal incontinence.  GU : Denies urinary burning, urinary frequency, urinary hesitancy MS: Denies joint pain, muscle weakness, cramps, or limitation of movement.  Derm: Denies rash, itching, dry skin Psych: Denies depression, anxiety, memory loss, and confusion Heme: Denies bruising, bleeding, and enlarged lymph nodes.  Physical Exam: There were no vitals taken for this visit. General:   Alert and oriented. Pleasant and cooperative. Well-nourished and well-developed.  Head:  Normocephalic and atraumatic. Eyes:  Without icterus, sclera clear and conjunctiva pink.  Ears:  Normal auditory acuity. Nose:  No deformity, discharge,  or lesions. Mouth:  No deformity or lesions, oral mucosa pink.  Neck:  Supple, without mass or  thyromegaly. Lungs:  Clear to auscultation bilaterally. No wheezes, rales, or rhonchi. No distress.  Heart:  S1, S2 present without murmurs appreciated.  Abdomen:  +BS, soft, non-tender and non-distended. No HSM noted. No guarding or rebound. No masses appreciated.  Rectal:  Deferred  Msk:  Symmetrical without gross deformities. Normal posture. Pulses:  Normal pulses noted. Extremities:  Without clubbing or edema. Neurologic:  Alert and  oriented x4;  grossly normal neurologically. Skin:  Intact without significant lesions or rashes. Cervical Nodes:  No significant cervical adenopathy. Psych:  Alert and cooperative. Normal mood and affect.

## 2020-09-06 ENCOUNTER — Ambulatory Visit: Payer: Medicare Other | Admitting: Gastroenterology

## 2020-09-06 ENCOUNTER — Encounter: Payer: Self-pay | Admitting: Internal Medicine

## 2020-09-28 ENCOUNTER — Emergency Department (HOSPITAL_COMMUNITY): Payer: Medicare Other

## 2020-09-28 ENCOUNTER — Emergency Department (HOSPITAL_COMMUNITY)
Admission: EM | Admit: 2020-09-28 | Discharge: 2020-09-28 | Disposition: A | Discharge status: Home / Self Care | Payer: Medicare Other | Attending: Emergency Medicine | Admitting: Emergency Medicine

## 2020-09-28 ENCOUNTER — Other Ambulatory Visit: Payer: Self-pay

## 2020-09-28 DIAGNOSIS — M5412 Radiculopathy, cervical region: Secondary | ICD-10-CM | POA: Insufficient documentation

## 2020-09-28 DIAGNOSIS — M542 Cervicalgia: Secondary | ICD-10-CM | POA: Diagnosis not present

## 2020-09-28 DIAGNOSIS — M47812 Spondylosis without myelopathy or radiculopathy, cervical region: Secondary | ICD-10-CM | POA: Insufficient documentation

## 2020-09-28 DIAGNOSIS — F17210 Nicotine dependence, cigarettes, uncomplicated: Secondary | ICD-10-CM | POA: Insufficient documentation

## 2020-09-28 DIAGNOSIS — M4722 Other spondylosis with radiculopathy, cervical region: Secondary | ICD-10-CM | POA: Diagnosis not present

## 2020-09-28 IMAGING — CT CT CERVICAL SPINE W/O CM
3 series · 12 of 33 positions shown, 14 images · non-contrast
Comparison: None.

CLINICAL DATA: Severe neck pain, chronic

EXAM:
CT CERVICAL SPINE WITHOUT CONTRAST
TECHNIQUE: Multidetector CT imaging of the cervical spine was performed without
intravenous contrast. Multiplanar CT image reconstructions were also
generated.

[Series 4: c spine soft · axial · 0.45mm/px · z∈[-33,+95]mm · 4 of 94 slices shown, 5 images]
[im 15/94  soft-tissue]
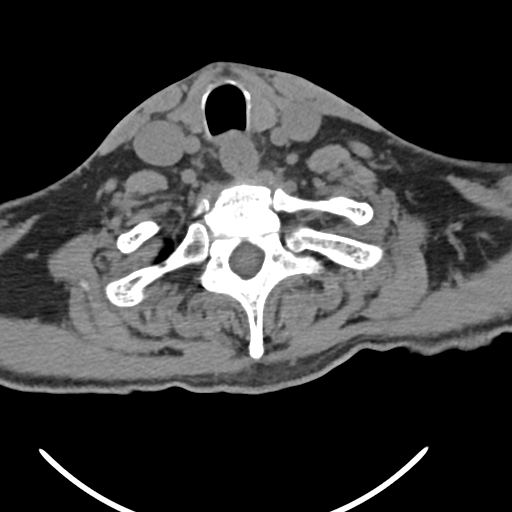
[im 15/94  bone]
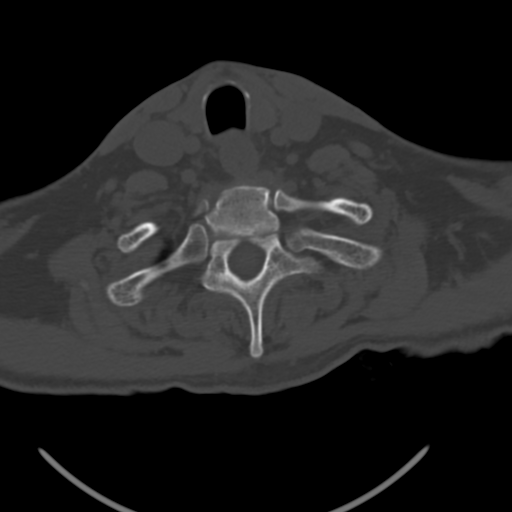
[im 36/94  bone]
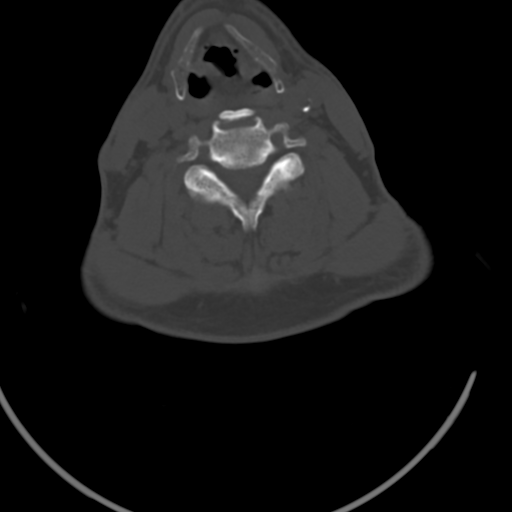
[im 58/94  bone]
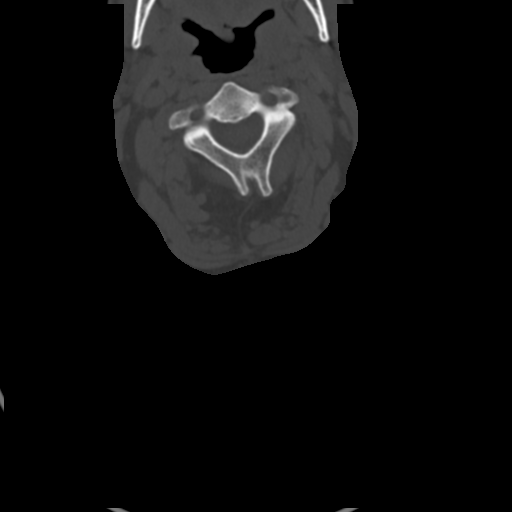
[im 79/94  bone]
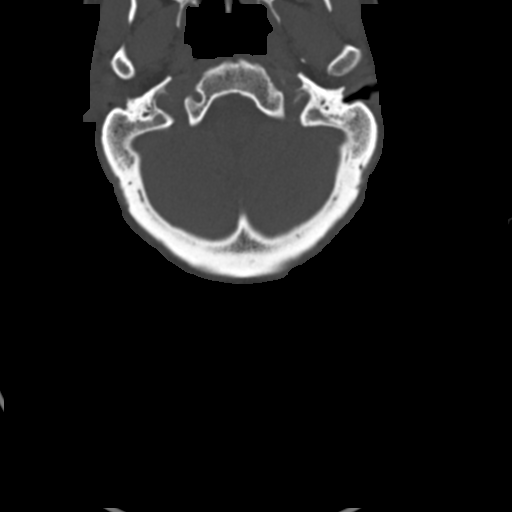

[Series 5: sag bone · sagittal · 0.27mm/px · 5 of 61 slices shown, 6 images]
[im 21/61  bone]
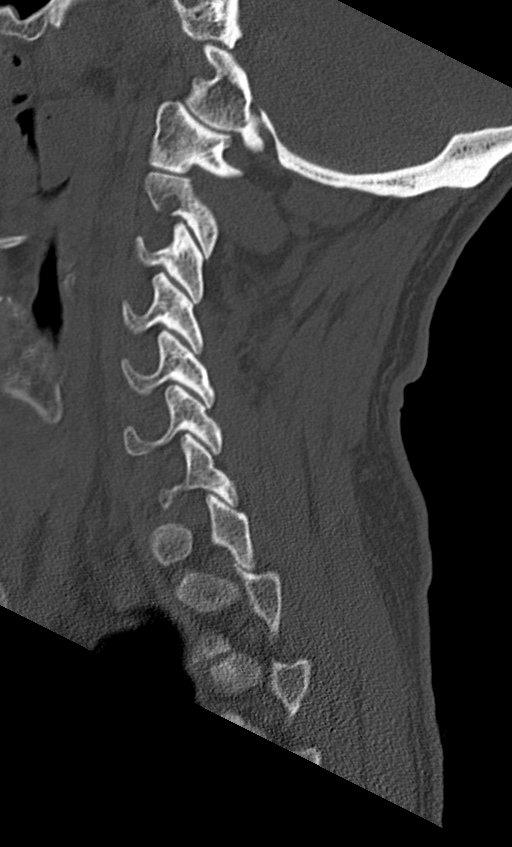
[im 26/61  bone]
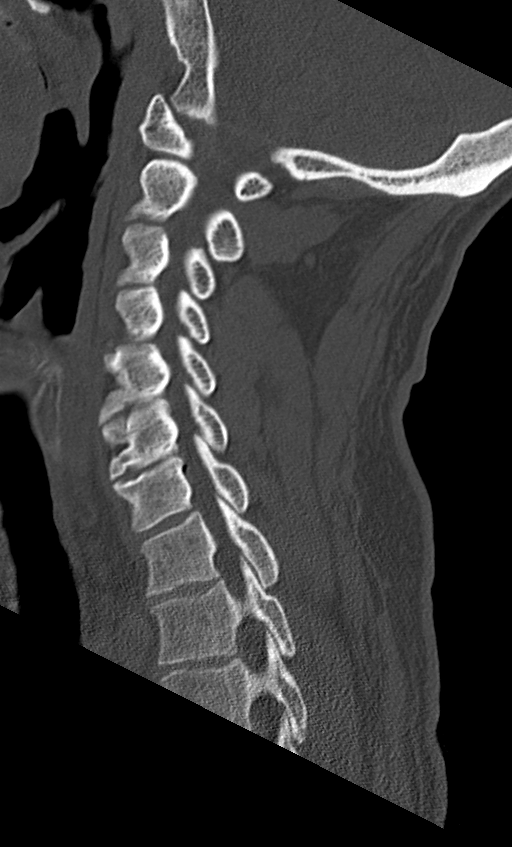
[im 31/61  soft-tissue]
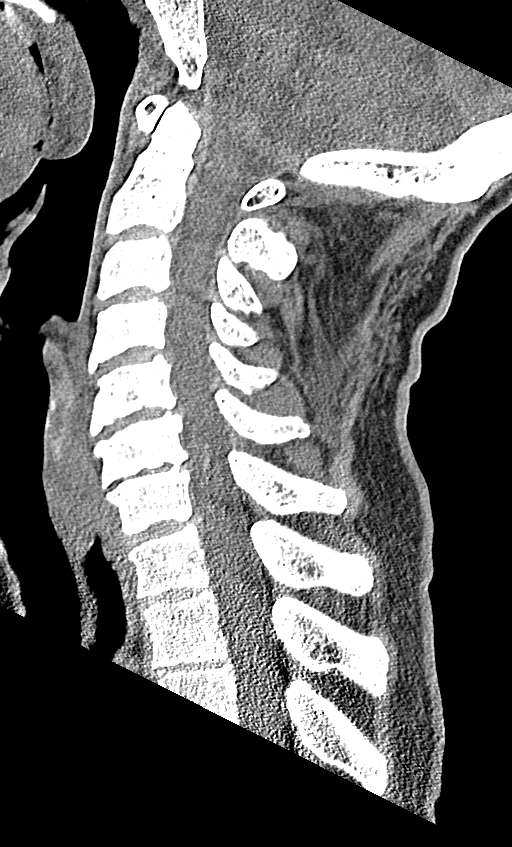
[im 31/61  bone]
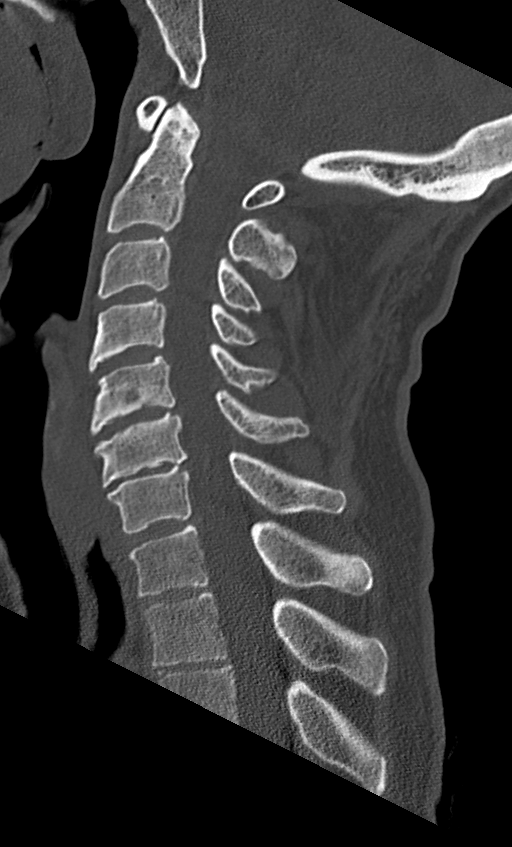
[im 36/61  bone]
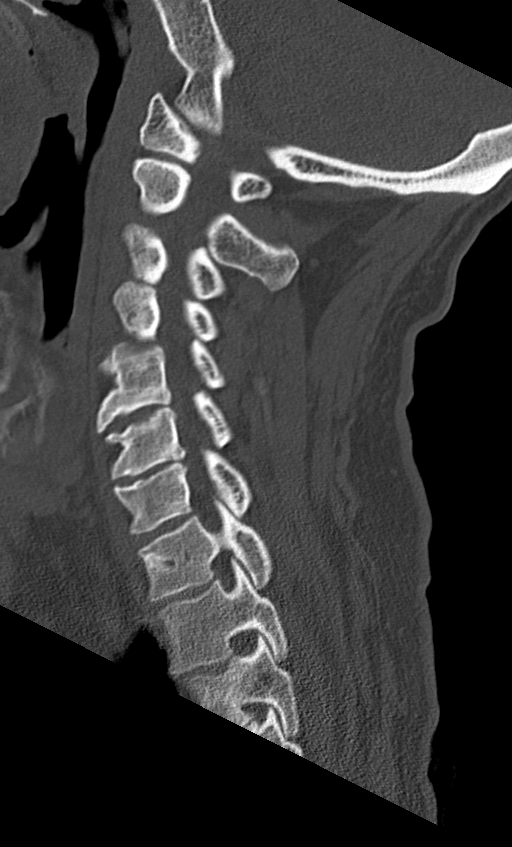
[im 41/61  bone]
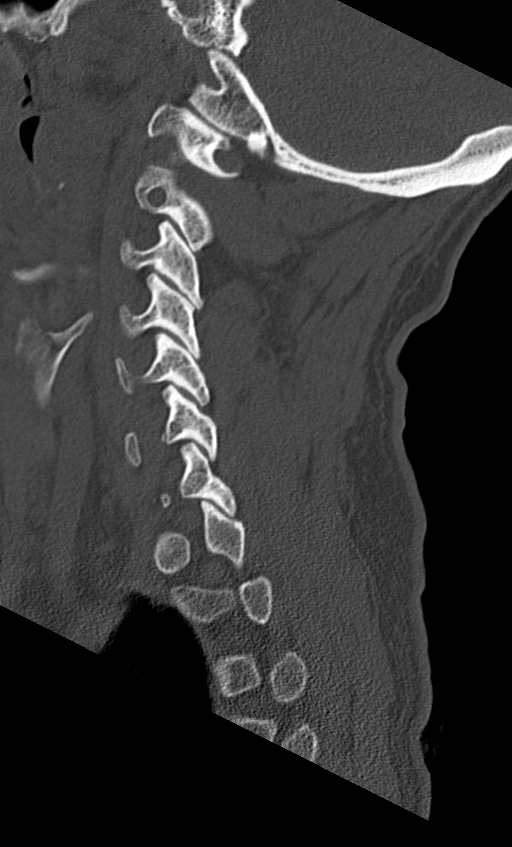

[Series 6: cor bone · coronal · 0.28mm/px · 3 of 61 slices shown]
[im 15/61  bone]
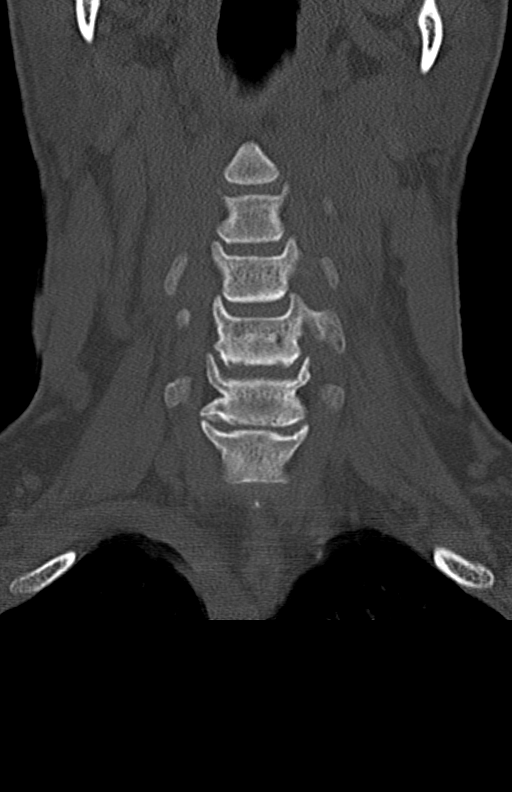
[im 25/61  bone]
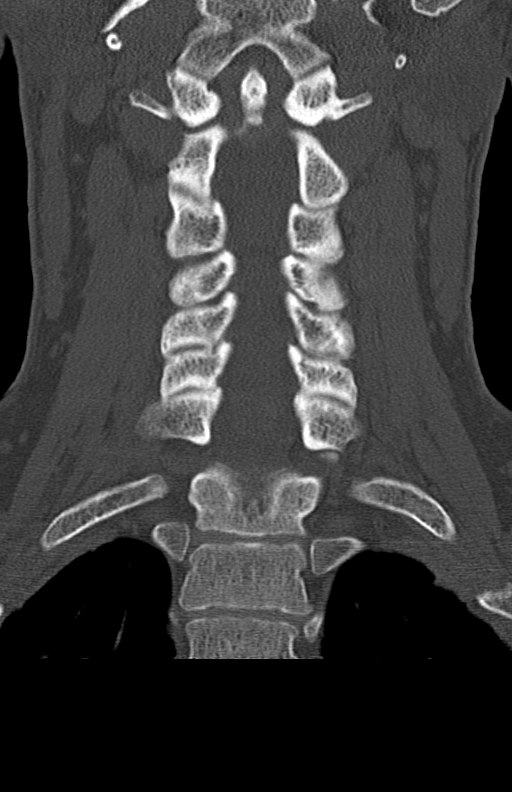
[im 36/61  bone]
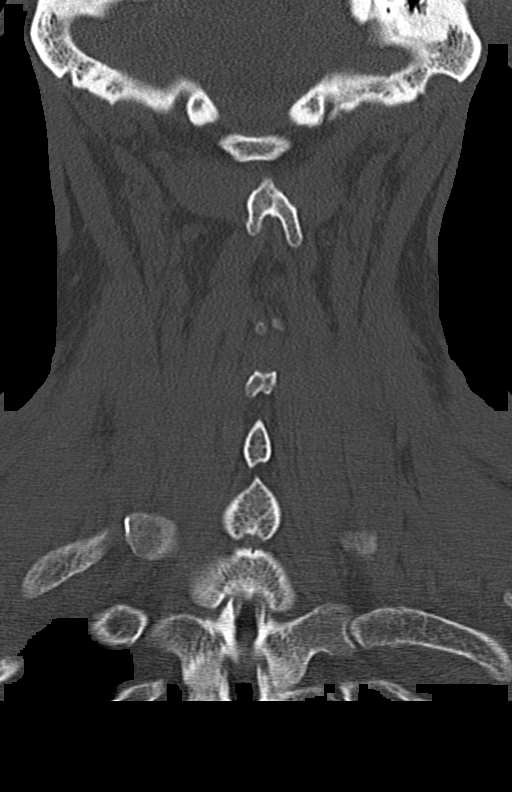

[12 of 33 positions shown; findings below may reference images not displayed]

FINDINGS: Alignment: Physiologic

Skull base and vertebrae: Visualized skull base is intact. No
atlanto-occipital dissociation. The vertebral body heights are well
maintained. No fracture or pathologic osseous lesion seen.

Soft tissues and spinal canal: The visualized paraspinal soft
tissues are unremarkable. No prevertebral soft tissue swelling is
seen. The spinal canal is grossly unremarkable, no large epidural
collection or significant canal narrowing.

Disc levels: Multilevel cervical spine spondylosis seen with disc
osteophyte complex and uncovertebral osteophytes most notable at
C5-C6 with moderate neural foraminal narrowing and mild effacement
of the central thecal sac. Also at C6-C7 there is moderate bilateral
neural foraminal narrowing and a possible right lateral recess disc
protrusion with mild to moderate central canal stenosis.

Upper chest: The lung apices are clear. Thoracic inlet is within
normal limits.

Other: None
IMPRESSION: Cervical spine spondylosis most notable at C6-C7 with moderate
bilateral neural foraminal narrowing and mild to moderate central
canal stenosis. If further evaluation is required would recommend
nonemergent MRI.

## 2020-09-28 MED ORDER — DEXAMETHASONE SODIUM PHOSPHATE 10 MG/ML IJ SOLN
10.0000 mg | Freq: Once | INTRAMUSCULAR | Status: AC
  Administered 2020-09-28: 10 mg via INTRAVENOUS
  Filled 2020-09-28: qty 1

## 2020-09-28 MED ORDER — MORPHINE SULFATE (PF) 4 MG/ML IV SOLN
4.0000 mg | Freq: Once | INTRAVENOUS | Status: AC
  Administered 2020-09-28: 4 mg via INTRAVENOUS
  Filled 2020-09-28: qty 1

## 2020-09-28 MED ORDER — NAPROXEN 500 MG PO TABS: 500.0000 mg | ORAL_TABLET | Freq: Two times a day (BID) | ORAL | 0 refills | Status: DC

## 2020-09-28 MED ORDER — OXYCODONE HCL 5 MG PO TABS
10.0000 mg | ORAL_TABLET | Freq: Once | ORAL | Status: AC
  Administered 2020-09-28: 10 mg via ORAL
  Filled 2020-09-28: qty 2

## 2020-09-28 MED ORDER — METHOCARBAMOL 500 MG PO TABS
500.0000 mg | ORAL_TABLET | Freq: Once | ORAL | Status: AC
  Administered 2020-09-28: 500 mg via ORAL
  Filled 2020-09-28: qty 1

## 2020-09-28 MED ORDER — ACETAMINOPHEN 500 MG PO TABS
1000.0000 mg | ORAL_TABLET | Freq: Once | ORAL | Status: AC
  Administered 2020-09-28: 1000 mg via ORAL
  Filled 2020-09-28: qty 2

## 2020-09-28 MED ORDER — METHOCARBAMOL 500 MG PO TABS: 500.0000 mg | ORAL_TABLET | Freq: Two times a day (BID) | ORAL | 0 refills | Status: DC | PRN

## 2020-09-28 MED ORDER — METHYLPREDNISOLONE 4 MG PO TBPK: ORAL_TABLET | ORAL | 0 refills | Status: DC
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
## 2020-09-28 NOTE — ED Notes (Signed)
Patient transported to CT 

## 2020-09-28 NOTE — Discharge Instructions (Signed)
I have spoken with the neurosurgeon, spinal surgeon, Dr. Danielle Dess who has agreed to see you in the office in follow-up.  It is vital that you call the office on Monday morning for an expedited visit, you will need to have an MRI which Dr. Danielle Dess will likely order for you to get as an outpatient.  In the meantime if you should develop severe or worsening pain, weakness or numbness of the arms or legs or have any other severe or worsening symptoms or concerns please return to the emergency department immediately.  Please take the Medrol Dosepak exactly as prescribed, start with 6 pills tomorrow, then 5 pills, 4 pills, 3 pills, 2 pills, 1 pill over 6 days. Naproxen twice a day Warm compresses to the neck

## 2020-09-28 NOTE — ED Provider Notes (Signed)
[facility] EMERGENCY DEPARTMENT Provider Note   CSN: [id number] Arrival date & time: 09/28/20  2007     History Chief Complaint  Patient presents with  . Neck Pain    Frank Page is a 50 y.o. male.  HPI   This patient is a 50 year old male, multiple medical problems including alcohol abuse, history of thrombocytopenia, history of multiple orthopedic injuries in the past and a history of chronic back pain.  He reports that in the past he was supposed to get cervical spine surgery because of herniated disks however he had multiple other orthopedic injuries that required a more rapid evaluation and stabilizing treatments.  After these had healed the patient has not yet been back to get his neck looked at.  He reports that from time to time he will move his neck from side to side and feel popping and cracking and that is not unusual.  2 days ago he did the same thing but felt too big pops and cracks in the neck following after which he has had tingling in the tips of his fingers and pain radiating from his neck down his arms and into his shoulder blades bilaterally.  This is rather persistent, it is worse with moving the head or the neck, it is not associated with any chest pain coughing or shortness of breath or fevers or chills.  He reports that he occasionally will have a sharp and shooting pains from time to time over the past but it has been constant for the last 2 days.  He has not followed up with a spinal surgeon in many years  Past Medical History:  Diagnosis Date  . Alcohol abuse 09/05/2013  . Alcohol withdrawal (HCC) 09/05/2013   History of alcohol withdrawal seizures  . Alcoholic hepatitis 09/06/2013  . Chronic pain syndrome 09/05/2013  . Herniated disc   . Nonunion of fracture   . Pneumonia 09/05/2013  . Psoriasis   . Thrombocytopenia, unspecified (HCC) 09/05/2013    Patient Active Problem List   Diagnosis Date Noted  . Alcohol dependence with withdrawal with complication (HCC)  09/02/2014  . Substance induced mood disorder (HCC) 09/02/2014  . Suicidal ideation   . Alcoholic hepatitis 09/06/2013  . Pneumonia 09/05/2013  . Alcohol abuse 09/05/2013  . Transaminitis 09/05/2013  . Thrombocytopenia, unspecified (HCC) 09/05/2013  . Chronic pain syndrome 09/05/2013    Past Surgical History:  Procedure Laterality Date  . HERNIA REPAIR    . JOINT REPLACEMENT    . ORIF FEMORAL SHAFT FRACTURE W/ PLATES AND SCREWS         No family history on file.  Social History   Tobacco Use  . Smoking status: Current Every Day Smoker    Packs/day: 2.00    Years: 4.00    Pack years: 8.00    Types: Cigarettes  . Smokeless tobacco: Never Used  Substance Use Topics  . Alcohol use: Yes    Alcohol/week: 168.0 standard drinks    Types: 168 Cans of beer per week    Comment: heavily  . Drug use: No    Home Medications Prior to Admission medications   Medication Sig Start Date End Date Taking? Authorizing Provider  methylPREDNISolone (MEDROL DOSEPAK) 4 MG TBPK tablet Taper over 6 days please 09/28/20  Yes Eber Hong, MD  naproxen (NAPROSYN) 500 MG tablet Take 1 tablet (500 mg total) by mouth 2 (two) times daily with a meal. 09/28/20  Yes Eber Hong, MD  pantoprazole (PROTONIX) 40 MG  tablet Take 1 tablet (40 mg total) by mouth daily. 07/12/20  Yes Pollina, Canary Brimhristopher J, MD  dicyclomine (BENTYL) 20 MG tablet Take 1 tablet (20 mg total) by mouth 3 (three) times daily before meals. 07/12/20 09/28/20  Gilda CreasePollina, Christopher J, MD  DULoxetine (CYMBALTA) 30 MG capsule Take 1 capsule (30 mg total) by mouth daily. For depression 09/05/14 09/28/20  Rankin, Shuvon B, NP  gabapentin (NEURONTIN) 100 MG capsule Take 2 capsules (200 mg total) by mouth 2 (two) times daily. For agitation 09/05/14 09/28/20  Rankin, Shuvon B, NP  sucralfate (CARAFATE) 1 g tablet Take 1 tablet (1 g total) by mouth 4 (four) times daily -  with meals and at bedtime. 07/12/20 09/28/20  Gilda CreasePollina, Christopher J, MD   traZODone (DESYREL) 150 MG tablet Take 1 tablet (150 mg total) by mouth at bedtime as needed for sleep. 09/05/14 09/28/20  Rankin, Shuvon B, NP    Allergies    Penicillins  Review of Systems   Review of Systems  All other systems reviewed and are negative.   Physical Exam Updated Vital Signs BP (!) 151/81   Pulse 88   Temp 98.3 F (36.8 C) (Oral)   Resp 18   Ht 1.575 m (5\' 2" )   Wt 57.2 kg   SpO2 94%   BMI 23.05 kg/m   Physical Exam Vitals and nursing note reviewed.  Constitutional:      General: He is not in acute distress.    Appearance: He is well-developed and well-nourished.  HENT:     Head: Normocephalic and atraumatic.     Mouth/Throat:     Mouth: Oropharynx is clear and moist.     Pharynx: No oropharyngeal exudate.  Eyes:     General: No scleral icterus.       Right eye: No discharge.        Left eye: No discharge.     Extraocular Movements: EOM normal.     Conjunctiva/sclera: Conjunctivae normal.     Pupils: Pupils are equal, round, and reactive to light.  Neck:     Thyroid: No thyromegaly.     Vascular: No JVD.  Cardiovascular:     Rate and Rhythm: Normal rate and regular rhythm.     Pulses: Intact distal pulses.     Heart sounds: Normal heart sounds. No murmur heard. No friction rub. No gallop.   Pulmonary:     Effort: Pulmonary effort is normal. No respiratory distress.     Breath sounds: Normal breath sounds. No wheezing or rales.  Abdominal:     General: Bowel sounds are normal. There is no distension.     Palpations: Abdomen is soft. There is no mass.     Tenderness: There is no abdominal tenderness.  Musculoskeletal:        General: Tenderness present. No edema. Normal range of motion.     Cervical back: Normal range of motion and neck supple.     Comments: Tenderness present over C5-6 7  Lymphadenopathy:     Cervical: No cervical adenopathy.  Skin:    General: Skin is warm and dry.     Findings: No erythema or rash.  Neurological:      Mental Status: He is alert.     Coordination: Coordination normal.     Comments: The patient has normal strength at the shoulders elbows wrists and grips though it appears that he has some give out weakness.  Normal biceps, triceps, forearms.  He has give out weakness.  He has a paresthesias over the tips of all of his fingers but has the ability to extend his thumbs, extend his hands, crosses fingers, no signs of radial median or ulnar nerve abnormalities.  He has bilateral brachiocephalic reflexes which are normal.  Lower extremities are normal on the left with regards to strength at the hips knees and ankles and diffuse sensation.  Right lower extremity has decreased range at the hip knee and ankle, he is able to do these things but appears again to have some give out weakness.  He also states that there is a difference in sensation like the leg.  Normal patellar reflex on the left, abnormal patellar reflex on the right, it is weaker  Psychiatric:        Mood and Affect: Mood and affect normal.        Behavior: Behavior normal.     ED Results / Procedures / Treatments   Labs (all labs ordered are listed, but only abnormal results are displayed) Labs Reviewed - No data to display  EKG None  Radiology CT Cervical Spine Wo Contrast  Result Date: 09/28/2020 CLINICAL DATA:  Severe neck pain, chronic EXAM: CT CERVICAL SPINE WITHOUT CONTRAST TECHNIQUE: Multidetector CT imaging of the cervical spine was performed without intravenous contrast. Multiplanar CT image reconstructions were also generated. COMPARISON:  None. FINDINGS: Alignment: Physiologic Skull base and vertebrae: Visualized skull base is intact. No atlanto-occipital dissociation. The vertebral body heights are well maintained. No fracture or pathologic osseous lesion seen. Soft tissues and spinal canal: The visualized paraspinal soft tissues are unremarkable. No prevertebral soft tissue swelling is seen. The spinal canal is grossly  unremarkable, no large epidural collection or significant canal narrowing. Disc levels: Multilevel cervical spine spondylosis seen with disc osteophyte complex and uncovertebral osteophytes most notable at C5-C6 with moderate neural foraminal narrowing and mild effacement of the central thecal sac. Also at C6-C7 there is moderate bilateral neural foraminal narrowing and a possible right lateral recess disc protrusion with mild to moderate central canal stenosis. Upper chest: The lung apices are clear. Thoracic inlet is within normal limits. Other: None IMPRESSION: Cervical spine spondylosis most notable at C6-C7 with moderate bilateral neural foraminal narrowing and mild to moderate central canal stenosis. If further evaluation is required would recommend nonemergent MRI. Electronically Signed   By: Jonna Clark M.D.   On: 09/28/2020 21:04    Procedures Procedures   Medications Ordered in ED Medications  morphine 4 MG/ML injection 4 mg (4 mg Intravenous Given 09/28/20 2035)  dexamethasone (DECADRON) injection 10 mg (10 mg Intravenous Given 09/28/20 2035)  acetaminophen (TYLENOL) tablet 1,000 mg (1,000 mg Oral Given 09/28/20 2148)    ED Course  I have reviewed the triage vital signs and the nursing notes.  Pertinent labs & imaging results that were available during my care of the patient were reviewed by me and considered in my medical decision making (see chart for details).    MDM Rules/Calculators/A&P                          Unfortunately this patient has increased amounts of neck pain, I suspect this is related to her herniated disc, given his significant history of cervical spine abnormalities and the lack of an MRI at this facility I will proceed with a CT scan, Decadron and some pain control.  At this point he has no focal numbness or weakness, he does have some subjective decrease sensation in  the tips of all of the fingers of both hands.  The patient is agreeable to the plan.  He will  also get some pain control, he is tachycardic likely related to the pain  CT scan of the cervical spine shows that there is spondylosis at C6 and C7 with moderate bilateral neural foraminal narrowing and mild to moderate central canal stenosis.  We will discuss with neurosurgery as the patient does appear to have some new neurologic deficits.  He has been given Decadron, he is not have any significant improvement at this time after morphine.  He is no longer tachycardic, states his last drink was 3 or 4 days ago, he is usually a daily drinker.  He is not tremulous, he is not diaphoretic, his mental status is normal.  I discussed the care with Dr. Danielle Dess, he has been kind enough to see the patient in follow-up.  He has recommended that the patient be on a steroid pack for the next week, will need an outpatient MRI, he does not feel that the patient needs an emergent MRI.  The patient was able to ambulate into the hospital, he is willing to follow-up in the outpatient setting  Medrol Dosepak will be prescribed  Final Clinical Impression(s) / ED Diagnoses Final diagnoses:  Cervical spondylosis  Radiculopathy of cervical spine    Rx / DC Orders ED Discharge Orders         Ordered    methylPREDNISolone (MEDROL DOSEPAK) 4 MG TBPK tablet        09/28/20 2149    naproxen (NAPROSYN) 500 MG tablet  2 times daily with meals        09/28/20 2149           Eber Hong, MD 09/28/20 2150

## 2020-10-01 DIAGNOSIS — M4722 Other spondylosis with radiculopathy, cervical region: Secondary | ICD-10-CM | POA: Diagnosis not present

## 2020-10-02 DIAGNOSIS — M4722 Other spondylosis with radiculopathy, cervical region: Secondary | ICD-10-CM | POA: Diagnosis not present

## 2020-10-02 DIAGNOSIS — G8929 Other chronic pain: Secondary | ICD-10-CM | POA: Diagnosis not present

## 2020-10-10 DIAGNOSIS — M4722 Other spondylosis with radiculopathy, cervical region: Secondary | ICD-10-CM | POA: Diagnosis not present

## 2020-10-10 DIAGNOSIS — M4802 Spinal stenosis, cervical region: Secondary | ICD-10-CM | POA: Diagnosis not present

## 2020-10-10 DIAGNOSIS — M5013 Cervical disc disorder with radiculopathy, cervicothoracic region: Secondary | ICD-10-CM | POA: Diagnosis not present

## 2020-10-10 DIAGNOSIS — M50123 Cervical disc disorder at C6-C7 level with radiculopathy: Secondary | ICD-10-CM | POA: Diagnosis not present

## 2020-10-10 DIAGNOSIS — M5011 Cervical disc disorder with radiculopathy,  high cervical region: Secondary | ICD-10-CM | POA: Diagnosis not present

## 2020-10-21 DIAGNOSIS — R030 Elevated blood-pressure reading, without diagnosis of hypertension: Secondary | ICD-10-CM | POA: Diagnosis not present

## 2020-10-21 DIAGNOSIS — G5601 Carpal tunnel syndrome, right upper limb: Secondary | ICD-10-CM | POA: Diagnosis not present

## 2020-10-21 DIAGNOSIS — G5621 Lesion of ulnar nerve, right upper limb: Secondary | ICD-10-CM | POA: Diagnosis not present

## 2020-10-23 DIAGNOSIS — R739 Hyperglycemia, unspecified: Secondary | ICD-10-CM | POA: Diagnosis not present

## 2020-10-23 DIAGNOSIS — M4722 Other spondylosis with radiculopathy, cervical region: Secondary | ICD-10-CM | POA: Diagnosis not present

## 2020-10-23 DIAGNOSIS — G8929 Other chronic pain: Secondary | ICD-10-CM | POA: Diagnosis not present

## 2020-10-31 ENCOUNTER — Other Ambulatory Visit: Payer: Self-pay

## 2020-10-31 ENCOUNTER — Emergency Department (HOSPITAL_COMMUNITY)
Admission: EM | Admit: 2020-10-31 | Discharge: 2020-11-02 | Disposition: A | Discharge status: Home / Self Care | Payer: Medicare Other | Attending: Emergency Medicine | Admitting: Emergency Medicine

## 2020-10-31 ENCOUNTER — Emergency Department (HOSPITAL_COMMUNITY): Payer: Medicare Other

## 2020-10-31 ENCOUNTER — Encounter (HOSPITAL_COMMUNITY): Payer: Self-pay | Admitting: Emergency Medicine

## 2020-10-31 DIAGNOSIS — F17210 Nicotine dependence, cigarettes, uncomplicated: Secondary | ICD-10-CM | POA: Diagnosis not present

## 2020-10-31 DIAGNOSIS — F1910 Other psychoactive substance abuse, uncomplicated: Secondary | ICD-10-CM

## 2020-10-31 DIAGNOSIS — R062 Wheezing: Secondary | ICD-10-CM | POA: Insufficient documentation

## 2020-10-31 DIAGNOSIS — Z79899 Other long term (current) drug therapy: Secondary | ICD-10-CM | POA: Diagnosis not present

## 2020-10-31 DIAGNOSIS — F1924 Other psychoactive substance dependence with psychoactive substance-induced mood disorder: Secondary | ICD-10-CM | POA: Insufficient documentation

## 2020-10-31 DIAGNOSIS — R000 Tachycardia, unspecified: Secondary | ICD-10-CM | POA: Diagnosis not present

## 2020-10-31 DIAGNOSIS — F1994 Other psychoactive substance use, unspecified with psychoactive substance-induced mood disorder: Secondary | ICD-10-CM | POA: Diagnosis present

## 2020-10-31 DIAGNOSIS — F10129 Alcohol abuse with intoxication, unspecified: Secondary | ICD-10-CM | POA: Diagnosis present

## 2020-10-31 DIAGNOSIS — R9431 Abnormal electrocardiogram [ECG] [EKG]: Secondary | ICD-10-CM | POA: Diagnosis not present

## 2020-10-31 LAB — COMPREHENSIVE METABOLIC PANEL
ALT: 179 U/L — ABNORMAL HIGH (ref 0–44)
AST: 273 U/L — ABNORMAL HIGH (ref 15–41)
Albumin: 4.3 g/dL (ref 3.5–5.0)
Alkaline Phosphatase: 86 U/L (ref 38–126)
Anion gap: 16 — ABNORMAL HIGH (ref 5–15)
BUN: 5 mg/dL — ABNORMAL LOW (ref 6–20)
CO2: 20 mmol/L — ABNORMAL LOW (ref 22–32)
Calcium: 8.8 mg/dL — ABNORMAL LOW (ref 8.9–10.3)
Chloride: 101 mmol/L (ref 98–111)
Creatinine, Ser: 0.69 mg/dL (ref 0.61–1.24)
GFR, Estimated: >60 mL/min (ref >60)
Glucose, Bld: 88 mg/dL (ref 70–99)
Potassium: 3.9 mmol/L (ref 3.5–5.1)
Sodium: 137 mmol/L (ref 135–145)
Total Bilirubin: 0.8 mg/dL (ref 0.3–1.2)
Total Protein: 8.2 g/dL — ABNORMAL HIGH (ref 6.5–8.1)

## 2020-10-31 LAB — URINALYSIS, ROUTINE W REFLEX MICROSCOPIC
Bacteria, UA: NONE SEEN
Bilirubin Urine: NEGATIVE
Glucose, UA: NEGATIVE mg/dL
Ketones, ur: NEGATIVE mg/dL
Leukocytes,Ua: NEGATIVE
Nitrite: NEGATIVE
Protein, ur: NEGATIVE mg/dL
Specific Gravity, Urine: 1.002 — ABNORMAL LOW (ref 1.005–1.030)
pH: 6 (ref 5.0–8.0)

## 2020-10-31 LAB — SALICYLATE LEVEL: Salicylate Lvl: <7 mg/dL — ABNORMAL LOW (ref 7.0–30.0)

## 2020-10-31 LAB — CBC WITH DIFFERENTIAL/PLATELET
Abs Immature Granulocytes: 0.01 10*3/uL (ref 0.00–0.07)
Basophils Absolute: 0.1 10*3/uL (ref 0.0–0.1)
Basophils Relative: 1 %
Eosinophils Absolute: 0.1 10*3/uL (ref 0.0–0.5)
Eosinophils Relative: 1 %
HCT: 51.2 % (ref 39.0–52.0)
Hemoglobin: 17.7 g/dL — ABNORMAL HIGH (ref 13.0–17.0)
Immature Granulocytes: 0 %
Lymphocytes Relative: 37 %
Lymphs Abs: 2.8 10*3/uL (ref 0.7–4.0)
MCH: 31.6 pg (ref 26.0–34.0)
MCHC: 34.6 g/dL (ref 30.0–36.0)
MCV: 91.4 fL (ref 80.0–100.0)
Monocytes Absolute: 0.4 10*3/uL (ref 0.1–1.0)
Monocytes Relative: 5 %
Neutro Abs: 4.1 10*3/uL (ref 1.7–7.7)
Neutrophils Relative %: 56 %
Platelets: 234 10*3/uL (ref 150–400)
RBC: 5.6 MIL/uL (ref 4.22–5.81)
RDW: 13.2 % (ref 11.5–15.5)
WBC: 7.5 10*3/uL (ref 4.0–10.5)
nRBC: 0 % (ref 0.0–0.2)

## 2020-10-31 LAB — RAPID URINE DRUG SCREEN, HOSP PERFORMED
Amphetamines: NOT DETECTED
Barbiturates: NOT DETECTED
Benzodiazepines: NOT DETECTED
Cocaine: NOT DETECTED
Opiates: NOT DETECTED
Tetrahydrocannabinol: POSITIVE — AB

## 2020-10-31 LAB — ETHANOL: Alcohol, Ethyl (B): 391 mg/dL (ref <10)

## 2020-10-31 LAB — ACETAMINOPHEN LEVEL: Acetaminophen (Tylenol), Serum: <10 ug/mL — ABNORMAL LOW (ref 10–30)

## 2020-10-31 IMAGING — DX DG CHEST 1V PORT
1 series · 1 of 1 positions shown · non-contrast
Comparison: [DATE]

CLINICAL DATA: Alcohol intoxication, diffuse pain

EXAM:
PORTABLE CHEST 1 VIEW

[chest ap]
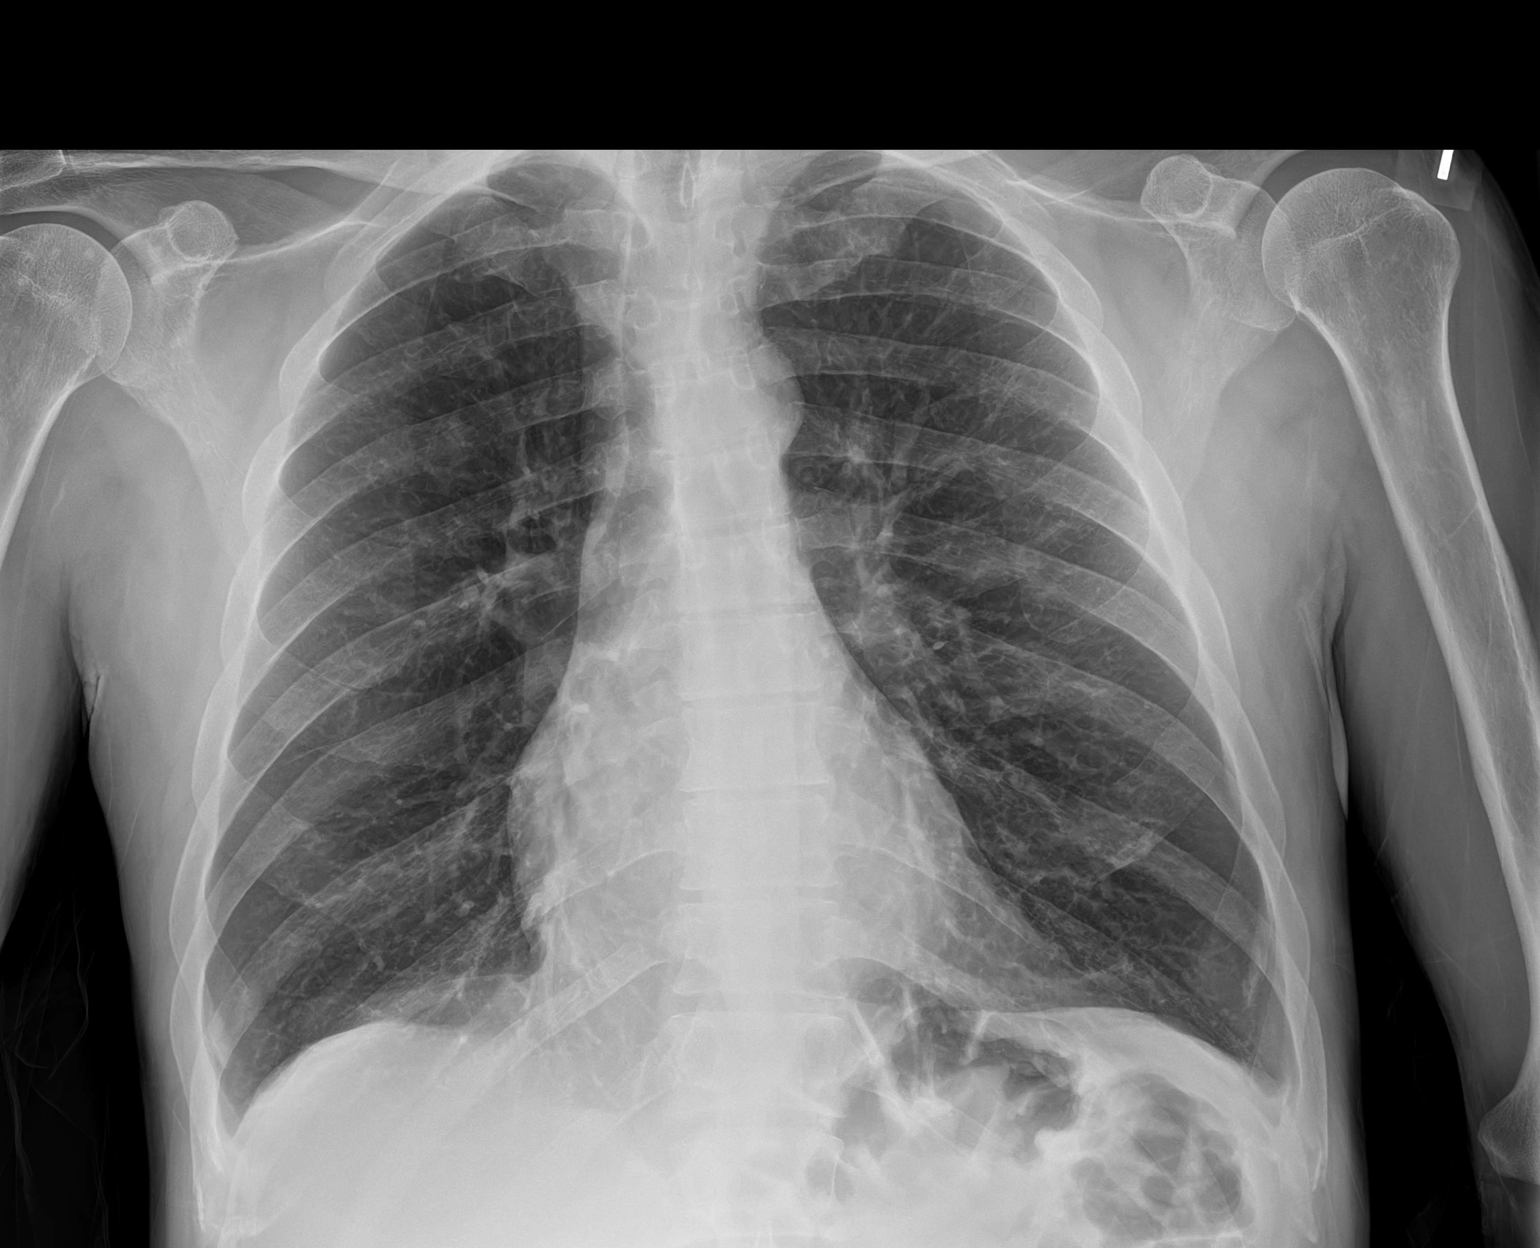

[1 of 1 positions shown; findings below may reference images not displayed]

FINDINGS: The heart size and mediastinal contours are within normal limits.
Both lungs are clear. The visualized skeletal structures are
unremarkable.
IMPRESSION: No active disease.

## 2020-10-31 MED ORDER — LORAZEPAM 1 MG PO TABS
0.0000 mg | ORAL_TABLET | Freq: Four times a day (QID) | ORAL | Status: DC
  Administered 2020-11-02: 1 mg via ORAL
  Filled 2020-10-31: qty 1

## 2020-10-31 MED ORDER — LORAZEPAM 1 MG PO TABS: 0.0000 mg | ORAL_TABLET | Freq: Two times a day (BID) | ORAL | Status: DC

## 2020-10-31 MED ORDER — FENTANYL CITRATE (PF) 100 MCG/2ML IJ SOLN
50.0000 ug | Freq: Once | INTRAMUSCULAR | Status: AC
  Administered 2020-10-31: 50 ug via INTRAVENOUS
  Filled 2020-10-31: qty 2

## 2020-10-31 MED ORDER — LORAZEPAM 2 MG/ML IJ SOLN: 0.0000 mg | Freq: Two times a day (BID) | INTRAMUSCULAR | Status: DC

## 2020-10-31 MED ORDER — THIAMINE HCL 100 MG/ML IJ SOLN
100.0000 mg | Freq: Every day | INTRAMUSCULAR | Status: DC
Start: 2020-10-31 — End: 2020-11-02
  Administered 2020-10-31: 100 mg via INTRAVENOUS
  Filled 2020-10-31: qty 2

## 2020-10-31 MED ORDER — LORAZEPAM 2 MG/ML IJ SOLN
0.0000 mg | Freq: Four times a day (QID) | INTRAMUSCULAR | Status: DC
  Administered 2020-10-31 – 2020-11-01 (×3): 2 mg via INTRAVENOUS
  Filled 2020-10-31 (×3): qty 1

## 2020-10-31 MED ORDER — NICOTINE 21 MG/24HR TD PT24
21.0000 mg | MEDICATED_PATCH | Freq: Every day | TRANSDERMAL | Status: DC
Start: 2020-10-31 — End: 2020-11-02
  Administered 2020-10-31 – 2020-11-02 (×3): 21 mg via TRANSDERMAL
  Filled 2020-10-31 (×3): qty 1

## 2020-10-31 MED ORDER — MORPHINE SULFATE (PF) 4 MG/ML IV SOLN
4.0000 mg | Freq: Once | INTRAVENOUS | Status: DC
Start: 2020-10-31 — End: 2020-10-31

## 2020-10-31 MED ORDER — THIAMINE HCL 100 MG PO TABS
100.0000 mg | ORAL_TABLET | Freq: Every day | ORAL | Status: DC
Start: 2020-10-31 — End: 2020-11-02
  Administered 2020-11-01 – 2020-11-02 (×2): 100 mg via ORAL
  Filled 2020-10-31 (×2): qty 1

## 2020-10-31 NOTE — ED Triage Notes (Signed)
Pt reports his daughter brought him in for eval due to alcohol intoxication; pt reports his entire body aches and hurts all over and has for years; pt reports he has had 11 beers today and normally drinks 8-9 beers per day

## 2020-10-31 NOTE — ED Notes (Signed)
Pt belongings removed and placed in bag and placed in locker. Public safety at bedside to wand pt, no contraband found. Safety sitter sitting outside room, pt is voluntary and not SI

## 2020-10-31 NOTE — ED Provider Notes (Signed)
[facility] EMERGENCY DEPARTMENT Provider Note   CSN: [id number] Arrival date & time: 10/31/20  1607     History Chief Complaint  Patient presents with  . Alcohol Intoxication     Frank Page is a 50 y.o. male presents emergency department for alcohol intoxication and history of alcohol abuse.  He is brought in by his daughter who feels he needs help.  Patient is alert and oriented at time of my initial exam, he states that he has had approximately 11 beers today, states that he drinks alcohol to manage the pain he has secondary to several herniated disks of the cervical spine.  He states he has had difficulty following up with his surgeon as directed from prior ED visit.  States he has a follow-up appointment scheduled for the first week of April, but states that he presented to the emergency department because he wants help with his alcohol abuse.  Additionally states he does not take any pain medication, denies illicit drug use, but then states that he uses marijuana in addition to alcohol.  Patient is mildly agitated, shifting around in his bed, will not sit still, occasionally crying out in pain secondary to pain with neck movement.  He denies any HI, but will not answer questions regarding suicidal ideation.  He states that "yeah sure I think about dying, but I did not come here to hurt myself or anybody else".  He will not answer questions about suicidality at home.  I personally reviewed this patient's medical records.  He has history of alcohol abuse with alcohol hepatitis and history of delirium tremens during alcohol withdrawal.  He also has history of suicidal ideation and history of multiple orthopedic injuries including herniated disks in his cervical spine.  Was recently seen in the emergency department with numbness and tingling secondary to herniated disks in his spine and was placed on Medrol Dosepak which he states he has completed.  HPI     Past Medical History:   Diagnosis Date  . Alcohol abuse 09/05/2013  . Alcohol withdrawal (HCC) 09/05/2013   History of alcohol withdrawal seizures  . Alcoholic hepatitis 09/06/2013  . Chronic pain syndrome 09/05/2013  . Herniated disc   . Nonunion of fracture   . Pneumonia 09/05/2013  . Psoriasis   . Thrombocytopenia, unspecified (HCC) 09/05/2013    Patient Active Problem List   Diagnosis Date Noted  . Alcohol dependence with withdrawal with complication (HCC) 09/02/2014  . Substance induced mood disorder (HCC) 09/02/2014  . Suicidal ideation   . Alcoholic hepatitis 09/06/2013  . Pneumonia 09/05/2013  . Alcohol abuse 09/05/2013  . Transaminitis 09/05/2013  . Thrombocytopenia, unspecified (HCC) 09/05/2013  . Chronic pain syndrome 09/05/2013    Past Surgical History:  Procedure Laterality Date  . HERNIA REPAIR    . JOINT REPLACEMENT    . ORIF FEMORAL SHAFT FRACTURE W/ PLATES AND SCREWS         History reviewed. No pertinent family history.  Social History   Tobacco Use  . Smoking status: Current Every Day Smoker    Packs/day: 2.00    Years: 4.00    Pack years: 8.00    Types: Cigarettes  . Smokeless tobacco: Never Used  Substance Use Topics  . Alcohol use: Yes    Alcohol/week: 168.0 standard drinks    Types: 168 Cans of beer per week    Comment: heavily  . Drug use: Yes    Types: Marijuana    Home Medications Prior to  Admission medications   Medication Sig Start Date End Date Taking? Authorizing Provider  methocarbamol (ROBAXIN) 500 MG tablet Take 1 tablet (500 mg total) by mouth 2 (two) times daily as needed for muscle spasms. 09/28/20   Eber Hong, MD  methylPREDNISolone (MEDROL DOSEPAK) 4 MG TBPK tablet Taper over 6 days please 09/28/20   Eber Hong, MD  naproxen (NAPROSYN) 500 MG tablet Take 1 tablet (500 mg total) by mouth 2 (two) times daily with a meal. 09/28/20   Eber Hong, MD  pantoprazole (PROTONIX) 40 MG tablet Take 1 tablet (40 mg total) by mouth daily. 07/12/20   Gilda Crease, MD  dicyclomine (BENTYL) 20 MG tablet Take 1 tablet (20 mg total) by mouth 3 (three) times daily before meals. 07/12/20 09/28/20  Gilda Crease, MD  DULoxetine (CYMBALTA) 30 MG capsule Take 1 capsule (30 mg total) by mouth daily. For depression 09/05/14 09/28/20  Rankin, Shuvon B, NP  gabapentin (NEURONTIN) 100 MG capsule Take 2 capsules (200 mg total) by mouth 2 (two) times daily. For agitation 09/05/14 09/28/20  Rankin, Shuvon B, NP  sucralfate (CARAFATE) 1 g tablet Take 1 tablet (1 g total) by mouth 4 (four) times daily -  with meals and at bedtime. 07/12/20 09/28/20  Gilda Crease, MD  traZODone (DESYREL) 150 MG tablet Take 1 tablet (150 mg total) by mouth at bedtime as needed for sleep. 09/05/14 09/28/20  Rankin, Shuvon B, NP    Allergies    Penicillins  Review of Systems   Review of Systems  Unable to perform ROS: Other (intoxication )    Physical Exam Updated Vital Signs BP 109/73   Pulse 83   Temp 98 F (36.7 C) (Oral)   Resp 19   SpO2 93%   Physical Exam Vitals and nursing note reviewed.  HENT:     Head: Normocephalic and atraumatic.     Nose: Nose normal.     Mouth/Throat:     Mouth: Mucous membranes are moist.     Pharynx: Oropharynx is clear. Uvula midline. No oropharyngeal exudate or posterior oropharyngeal erythema.     Tonsils: No tonsillar exudate.  Eyes:     General: Lids are normal. Vision grossly intact.        Right eye: No discharge.        Left eye: No discharge.     Extraocular Movements: Extraocular movements intact.     Conjunctiva/sclera: Conjunctivae normal.     Pupils: Pupils are equal, round, and reactive to light.  Neck:     Trachea: Trachea and phonation normal.  Cardiovascular:     Rate and Rhythm: Regular rhythm. Tachycardia present.     Pulses: Normal pulses.     Heart sounds: Normal heart sounds. No murmur heard.   Pulmonary:     Effort: Pulmonary effort is normal. No tachypnea, bradypnea or respiratory  distress.     Breath sounds: Normal breath sounds. No wheezing or rales.  Chest:     Chest wall: No mass, lacerations, deformity, swelling or tenderness.  Abdominal:     General: Bowel sounds are normal. There is no distension.     Palpations: Abdomen is soft.     Tenderness: There is no abdominal tenderness. There is no guarding or rebound.  Musculoskeletal:        General: Tenderness present. No deformity.     Cervical back: Normal range of motion and neck supple. Spasms, tenderness and bony tenderness present. No rigidity or crepitus. No pain  with movement, spinous process tenderness or muscular tenderness.     Thoracic back: Spasms and tenderness present. No bony tenderness.     Lumbar back: Spasms and tenderness present. No bony tenderness. Negative right straight leg raise test and negative left straight leg raise test.     Right lower leg: No edema.     Left lower leg: No edema.  Lymphadenopathy:     Cervical: No cervical adenopathy.  Skin:    General: Skin is warm and dry.     Capillary Refill: Capillary refill takes less than 2 seconds.  Neurological:     Mental Status: He is alert and oriented to person, place, and time.     Cranial Nerves: Cranial nerves are intact.     Sensory: Sensation is intact.     Motor: Motor function is intact.     Gait: Gait is intact.     Comments: Patient is inebriated,  however is moving all 4 extremities spontaneously without difficulty.   Psychiatric:        Attention and Perception: He does not perceive auditory or visual hallucinations.        Mood and Affect: Affect is labile.        Thought Content: Thought content includes suicidal ideation. Thought content does not include homicidal ideation.     Comments: He does not appear to be responding to internal stimuli, however he refuses to answer questions directly regarding suicidality or plan of suicide.  Per family patient vocalized suicidality this morning at home.     ED Results /  Procedures / Treatments   Labs (all labs ordered are listed, but only abnormal results are displayed) Labs Reviewed  COMPREHENSIVE METABOLIC PANEL - Abnormal; Notable for the following components:      Result Value   CO2 20 (*)    BUN 5 (*)    Calcium 8.8 (*)    Total Protein 8.2 (*)    AST 273 (*)    ALT 179 (*)    Anion gap 16 (*)    All other components within normal limits  ETHANOL - Abnormal; Notable for the following components:   Alcohol, Ethyl (B) 391 (*)    All other components within normal limits  RAPID URINE DRUG SCREEN, HOSP PERFORMED - Abnormal; Notable for the following components:   Tetrahydrocannabinol POSITIVE (*)    All other components within normal limits  CBC WITH DIFFERENTIAL/PLATELET - Abnormal; Notable for the following components:   Hemoglobin 17.7 (*)    All other components within normal limits  ACETAMINOPHEN LEVEL - Abnormal; Notable for the following components:   Acetaminophen (Tylenol), Serum <10 (*)    All other components within normal limits  SALICYLATE LEVEL - Abnormal; Notable for the following components:   Salicylate Lvl <7.0 (*)    All other components within normal limits  URINALYSIS, ROUTINE W REFLEX MICROSCOPIC - Abnormal; Notable for the following components:   Color, Urine STRAW (*)    Specific Gravity, Urine 1.002 (*)    Hgb urine dipstick SMALL (*)    All other components within normal limits  RESP PANEL BY RT-PCR (FLU A&B, COVID) [id number]    EKG None  Radiology DG Chest Port 1 View  Result Date: 10/31/2020 CLINICAL DATA:  Alcohol intoxication, diffuse pain EXAM: PORTABLE CHEST 1 VIEW COMPARISON:  09/06/2013 FINDINGS: The heart size and mediastinal contours are within normal limits. Both lungs are clear. The visualized skeletal structures are unremarkable. IMPRESSION: No active disease. Electronically Signed  By: Sharlet Salina M.D.   On: 10/31/2020 17:50    Procedures Procedures   Medications Ordered in ED Medications   LORazepam (ATIVAN) injection 0-4 mg (2 mg Intravenous Given 10/31/20 1745)    Or  LORazepam (ATIVAN) tablet 0-4 mg ( Oral See Alternative 10/31/20 1745)  LORazepam (ATIVAN) injection 0-4 mg (has no administration in time range)    Or  LORazepam (ATIVAN) tablet 0-4 mg (has no administration in time range)  thiamine tablet 100 mg ( Oral See Alternative 10/31/20 1744)    Or  thiamine (B-1) injection 100 mg (100 mg Intravenous Given 10/31/20 1744)  nicotine (NICODERM CQ - dosed in mg/24 hours) patch 21 mg (21 mg Transdermal Patch Applied 10/31/20 1848)  fentaNYL (SUBLIMAZE) injection 50 mcg (50 mcg Intravenous Given 10/31/20 1832)  fentaNYL (SUBLIMAZE) injection 50 mcg (50 mcg Intravenous Given 10/31/20 2135)    ED Course  I have reviewed the triage vital signs and the nursing notes.  Pertinent labs & imaging results that were available during my care of the patient were reviewed by me and considered in my medical decision making (see chart for details).  Clinical Course as of 10/31/20 2305  Thu Oct 31, 2020  1935 Collateral history obtained from the patient's daughter, Maci here in the emergency department.  She states that he is at baseline an alcoholic but it has escalated more recently.  She states that she gave him the option to be IVC in order to present to the emergency department voluntarily as this afternoon he began to vocalize suicidal ideations and encouraged her to "get well because I am not doing this anymore".  She was concerned for his state of mind and vocalization of suicidal thoughts, and brought him into the emergency department for evaluation and treatment of alcohol abuse.  She states that he has had alcohol withdrawal with seizures in the past and she is concerned for his safety should he be left alone in his home.  Additionally she states he is scheduled for cervical spinal surgery approximately 2 weeks to treat multiple herniated disks in his cervical spine; she is concerned that  his current alcohol dependence will prevent him from being eligible for that surgery.  I appreciate her insight into this patient's condition. [RS]  2206 Patient offered second dose of fentanyl for severe neck pain; unfortunately his O2 sat dropped briefly to 86% on RA. He was placed on 2L supplemental O2 by  with improvements in his sat.  [RS]    Clinical Course User Index [RS] Amore Ackman, Idelia Salm   MDM Rules/Calculators/A&P                         50 year old male presents the emergency department for concern of alcohol intoxication and suicidal ideation/vocalization of suicidal ideas to his family.  Patient is tachycardic and hypertensive on intake.  Cardiopulmonary exam revealed tachycardia.  Abdominal exam is benign.  Patient is neurovascular intact in all 4 extremities.  Musculoskeletal exam revealed tenderness palpation of the cervical spine as expected with history of cervical spinal disc herniation.  We will proceed with medical clearance and place patient on CIWA protocol for alcohol withdrawal.  CBC unremarkable, CMP with transaminitis, AST/ALT 273/179, consistent with alcohol abuse presentation.  Ethanol level 391, UDS with tetrahydrocannabinol needs.  UA unremarkable.  Improvement in patient's vital signs at this time.   Patient has been made provider default at this time, awaiting TTS evaluation once patient is  no longer inebriated.  At this time patient is voluntary in the emergency department, however given family concern for suicidality, should patient attempt to leave the department he should be IVC.  This information was communicated to oncoming ED provider at time of shift change.  This chart was dictated using voice recognition software, Dragon. Despite the best efforts of this provider to proofread and correct errors, errors may still occur which can change documentation meaning.  Final Clinical Impression(s) / ED Diagnoses Final diagnoses:  Wheezing    Rx /  DC Orders ED Discharge Orders    None       Sherrilee Gilles 10/31/20 2306    Eber Hong, MD 11/02/20 [phone]

## 2020-10-31 NOTE — ED Notes (Signed)
Ambulatory to restroom

## 2020-11-01 MED ORDER — ONDANSETRON HCL 4 MG/2ML IJ SOLN
4.0000 mg | Freq: Once | INTRAMUSCULAR | Status: AC
  Administered 2020-11-01: 4 mg via INTRAVENOUS
  Filled 2020-11-01: qty 2

## 2020-11-01 NOTE — ED Notes (Signed)
Pt in bed, pt denies nausea, no tremors noted, denies anxiety, feelings of skin crawling or hallucinations, pt answering questions appropriately.

## 2020-11-01 NOTE — ED Notes (Signed)
Pt satting 91% on room air, pt placed on 2L O2 via [place], pt satting 93 on 2L, pt denies nausea, feelings of skin crawling or hallucinations, pt only has slight tremor in finger tips when arm extended.

## 2020-11-01 NOTE — ED Notes (Signed)
Pt resting comfortably at this time. No signs of paroxymal sweating, tremors, hallucinations, or itching.

## 2020-11-01 NOTE — BH Assessment (Addendum)
Disposition: Nira Conn, PMHNP recommends inpatient treatment on 10/31/20. Per Hassie Bruce, RN on that day no appropriate beds at [facility].  Today, [facility] Denton Lank, RN), confirms not bed availability.   Received instruction from provider Berneice Heinrich, NP) that Medical Director (Dr. Nelly Rout) asked for patient to be faxed out.   Patient faxed out to multiple facilities for consideration of bed placement. See list below.  [facility]      [facility] Details     [name] Details     [facility] Details     [name] Details     [facility] Details     [facility] Details     [facility] Details     [facility] Details     [facility] Details     [name] Details     [facility] Details     [facility] Details     [name] Details     [facility] Details     [facility] Details     [facility] Details     [facility] Details     [facility]

## 2020-11-01 NOTE — BH Assessment (Signed)
Comprehensive Clinical Assessment (CCA) Note  11/01/2020 Frank Page [id number]   Disposition: Nira Conn, PMHNP recommends inpatient treatment. Per Frank Bruce, RN no appropriate beds at [facility]. Disposition CSW to seek placement. Disposition discussed with Frank Boatman, RN.   Flowsheet Row ED from 10/31/2020 in [facility] EMERGENCY DEPARTMENT ED from 09/28/2020 in [facility] EMERGENCY DEPARTMENT  C-SSRS RISK CATEGORY Low Risk No Risk      The patient demonstrates the following risk factors for suicide: Chronic risk factors for suicide include: substance use disorder and chronic pain. Acute risk factors for suicide include: passive suicidal ideations, alcohol abuse. Protective factors for this patient include: pt denies active suicidal ideations. Considering these factors, the overall suicide risk at this point appears to be low. Patient is appropriate for outpatient follow up.  Frank Page is 50 year old male who presents voluntary and unaccompanied to [facility]. Clinician asked the pt, "what brought you to the hospital?" Pt reported, he's been drinking bad, he has a follow up appointment on 11/04/2020 for a nerve/carpel tunnel test. Pt reported, he has damage to C5, C6, C7 and a herniated disk. Pt reported, he has a bad hip and leg. Pt reported, he does not do well with pills so he drinks to manage his pain. Pt reported, two weeks ago he popped his neck bad. Clinician observed and heard the pt's neck pop. Pt reported, if he doesn't get his body fixed he rather be dead. Pt clarified that he does not want to die he enjoys life very much.  Pt reported, drinking 12-15 [place] [employer] beer. Pt's BAL was 391 at 1711. Pt reports, not smoking much marijuana. Pt's UDS is positive for marijuana. Pt denies, being linked to OPT resources (medication management and/or counseling.) Pt has a previous inpatient admission in [place].   Pt presents quiet, awake sitting in hospital bed. Pt's eye contact was normal. Pt's  mood, affect was depressed. Pt's thought content was appropriate to mood and circumstances. Pt's  insight was fair. Pt's judgement was poor. Pt reported, if discharged from [facility] he can contract for safety.   Diagnosis: Substance induced mood disorder (HCC).  *Pt consented for clinician to talk to his daughter (Maci Brame, RN) to gather additional information. Per daughter, her main concern is th pt's drinking. Per daughter, the pt said he needs to get his thinks in order (will, life insurance), didn't want to live anymore, just didn't want to do this no more. Pt's daughter reported, the pt is supposed to have surgery in two weeks. Pt's daughter does not feel the pt can contract for safety if discharged.*  Chief Complaint:  Chief Complaint  Patient presents with  . Alcohol Intoxication   Visit Diagnosis:     CCA Screening, Triage and Referral (STR)  Patient Reported Information How did you hear about [place]? No data recorded Referral name: No data recorded Referral phone number: No data recorded  Whom do you see for routine medical problems? No data recorded Practice/Facility Name: No data recorded Practice/Facility Phone Number: No data recorded Name of Contact: No data recorded Contact Number: No data recorded Contact Fax Number: No data recorded Prescriber Name: No data recorded Prescriber Address (if known): No data recorded  What Is the Reason for Your Visit/Call Today? No data recorded How Long Has This Been Causing You Problems? No data recorded What Do You Feel Would Help You the Most Today? No data recorded  Have You Recently Been in Any Inpatient Treatment (Hospital/Detox/Crisis Center/28-Day  Program)? No data recorded Name/Location of Program/Hospital:No data recorded How Long Were You There? No data recorded When Were You Discharged? No data recorded  Have You Ever Received Services From [facility] Before? No data recorded Who Do You See at [facility]? No data  recorded  Have You Recently Had Any Thoughts About Hurting Yourself? No data recorded Are You Planning to Commit Suicide/Harm Yourself At This time? No data recorded  Have you Recently Had Thoughts About Hurting Someone Karolee Ohs? No data recorded Explanation: No data recorded  Have You Used Any Alcohol or Drugs in the Past 24 Hours? No data recorded How Long Ago Did You Use Drugs or Alcohol? No data recorded What Did You Use and How Much? No data recorded  Do You Currently Have a Therapist/Psychiatrist? No data recorded Name of Therapist/Psychiatrist: No data recorded  Have You Been Recently Discharged From Any Office Practice or Programs? No data recorded Explanation of Discharge From Practice/Program: No data recorded    CCA Screening Triage Referral Assessment Type of Contact: No data recorded Is this Initial or Reassessment? No data recorded Date Telepsych consult ordered in [facility]:  No data recorded Time Telepsych consult ordered in [facility]:  No data recorded  Patient Reported Information Reviewed? No data recorded Patient Left Without Being Seen? No data recorded Reason for Not Completing Assessment: No data recorded  Collateral Involvement: No data recorded  Does Patient Have a Court Appointed Legal Guardian? No data recorded Name and Contact of Legal Guardian: No data recorded If Minor and Not Living with Parent(s), Who has Custody? No data recorded Is CPS involved or ever been involved? No data recorded Is APS involved or ever been involved? No data recorded  Patient Determined To Be At Risk for Harm To Self or Others Based on Review of Patient Reported Information or Presenting Complaint? No data recorded Method: No data recorded Availability of Means: No data recorded Intent: No data recorded Notification Required: No data recorded Additional Information for Danger to Others Potential: No data recorded Additional Comments for Danger to Others Potential: No data  recorded Are There Guns or Other Weapons in Your Home? No data recorded Types of Guns/Weapons: No data recorded Are These Weapons Safely Secured?                            No data recorded Who Could Verify You Are Able To Have These Secured: No data recorded Do You Have any Outstanding Charges, Pending Court Dates, Parole/Probation? No data recorded Contacted To Inform of Risk of Harm To Self or Others: No data recorded  Location of Assessment: No data recorded  Does Patient Present under Involuntary Commitment? No data recorded IVC Papers Initial File Date: No data recorded  [place] of Residence: No data recorded  Patient Currently Receiving the Following Services: No data recorded  Determination of Need: No data recorded  Options For Referral: No data recorded    CCA Biopsychosocial Intake/Chief Complaint:  Per EDP/PA note: "is a 50 y.o. male presents emergency department for alcohol intoxication and history of alcohol abuse. He is brought in by his daughter who feels he needs help. Patient is alert and oriented at time of my initial exam, he states that he has had approximately 11 beers today, states that he drinks alcohol to manage the pain he has secondary to several herniated disks of the cervical spine. He states he has had difficulty following up with his surgeon  as directed from prior ED visit. States he has a follow-up appointment scheduled for the first week of April, but states that he presented to the emergency department because he wants help with his alcohol abuse. Additionally states he does not take any pain medication, denies illicit drug use, but then states that he uses marijuana in addition to alcohol. Patient is mildly agitated, shifting around in his bed, will not sit still, occasionally crying out in pain secondary to pain with neck movement. He denies any HI, but will not answer questions regarding suicidal ideation. He states that "yeah sure I think about dying, but  I did not come here to hurt myself or anybody else". He will not answer questions about suicidality at home. I personally reviewed this patient's medical records. He has history of alcohol abuse with alcohol hepatitis and history of delirium tremens during alcohol withdrawal. He also has history of suicidal ideation and history of multiple orthopedic injuries including herniated disks in his cervical spine. Was recently seen in the emergency department with numbness and tingling secondary to herniated disks in his spine and was placed on Medrol Dosepak which he states he has completed."  Current Symptoms/Problems: Depression, anxiety symptoms, alcohol use, passive suicidal ideations.   Patient Reported Schizophrenia/Schizoaffective Diagnosis in Past: No data recorded  Strengths: Not assessed.  Preferences: Not assessed.  Abilities: Not assessed.   Type of Services Patient Feels are Needed: Pt reported, if discharged from [facility] he can contract for safety. Per daughter (Maci Brame, RN) she the pt will drink himself to death.   Initial Clinical Notes/Concerns: No data recorded  Mental Health Symptoms Depression:  Sleep (too much or little); Weight gain/loss; Worthlessness; Increase/decrease in appetite; Hopelessness; Fatigue   Duration of Depressive symptoms: No data recorded  Mania:  None   Anxiety:   Worrying   Psychosis:  No data recorded  Duration of Psychotic symptoms: No data recorded  Trauma:  None   Obsessions:  None   Compulsions:  None   Inattention:  None   Hyperactivity/Impulsivity:  N/A   Oppositional/Defiant Behaviors:  None   Emotional Irregularity:  No data recorded  Other Mood/Personality Symptoms:  No data recorded   Mental Status Exam Appearance and self-care  Stature:  Average   Weight:  Average weight   Clothing:  -- (Pt sitting in hospital bed.)   Grooming:  -- (In scrubs.)   Cosmetic use:  None   Posture/gait:  Normal   Motor activity:  Not  Remarkable   Sensorium  Attention:  Normal   Concentration:  Normal   Orientation:  X5   Recall/memory:  Normal   Affect and Mood  Affect:  Depressed   Mood:  Depressed   Relating  Eye contact:  Normal   Facial expression:  No data recorded  Attitude toward examiner:  Cooperative   Thought and Language  Speech flow: Normal   Thought content:  Appropriate to Mood and Circumstances   Preoccupation:  None   Hallucinations:  None   Organization:  No data recorded  [employer] of Knowledge:  Fair   Intelligence:  No data recorded  Abstraction:  No data recorded  Judgement:  Poor   Reality Testing:  No data recorded  Insight:  Fair   Decision Making:  Impulsive   Social Functioning  Social Maturity:  No data recorded  Social Judgement:  No data recorded  Stress  Stressors:  Other (Comment) (bills paid on time, his check engine light  on (in car), pain.)   Coping Ability:  Overwhelmed   Skill Deficits:  Self-control   Supports:  Family     Religion: Religion/Spirituality Are You A Religious Person?: Yes What is Your Religious Affiliation?:  (Per pt, "I believe in God.")  Leisure/Recreation: Leisure / Recreation Do You Have Hobbies?: Yes Leisure and Hobbies: Kayaking.  Exercise/Diet: Exercise/Diet Do You Follow a Special Diet?: No Do You Have Any Trouble Sleeping?: Yes Explanation of Sleeping Difficulties: Pt reported, not sleeping.   CCA Employment/Education Employment/Work Situation: Employment / Work Situation Employment situation: On disability Why is patient on disability: Not assessed. How long has patient been on disability: Not assessed. What is the longest time patient has a held a job?: Not assessed. Where was the patient employed at that time?: Not assessed. Has patient ever been in the [employer] Lilly and Companymilitary?: No  Education: Education Is Patient Currently Attending School?: No Last Grade Completed: 12 Did You Graduate From  [employer]?: Yes Did You Attend College?: Yes What Type of College Degree Do you Have?: [employer], [employer], [employer], [employer].   CCA Family/Childhood History Family and Relationship History: Family history Marital status: Divorced Divorced, when?: 2009 What types of issues is patient dealing with in the relationship?: Not assessed. Additional relationship information: Not assessed. Are you sexually active?:  (Not assessed.) What is your sexual orientation?: Not assessed. Has your sexual activity been affected by drugs, alcohol, medication, or emotional stress?: Not assessed. Does patient have children?: Yes How many children?: 3 How is patient's relationship with their children?: Not assessed.  Childhood History:  Childhood History By whom was/is the patient raised?: Other (Comment) (Not assessed.) Additional childhood history information: Not assessed. Description of patient's relationship with caregiver when they were a child: Not assessed. Patient's description of current relationship with people who raised him/her: Not assessed. How were you disciplined when you got in trouble as a child/adolescent?: Not assessed. Does patient have siblings?: Yes Number of Siblings: 3 Description of patient's current relationship with siblings: Not assessed. Did patient suffer any verbal/emotional/physical/sexual abuse as a child?: No Did patient suffer from severe childhood neglect?: No Has patient ever been sexually abused/assaulted/raped as an adolescent or adult?: No Was the patient ever a victim of a crime or a disaster?: No Witnessed domestic violence?: No Has patient been affected by domestic violence as an adult?:  (NA)  Child/Adolescent Assessment:     CCA Substance Use Alcohol/Drug Use: Alcohol / Drug Use Pain Medications: See MAR Prescriptions: See MAR Over the Counter: See MAR History of alcohol / drug  use?: Yes Substance #1 Name of Substance 1: Alcohol. 1 - Age of First Use: UTA 1 - Amount (size/oz): Pt reported, drinking 12-15 [place] [employer] beer. Pt's BAL was 391 at 1711. 1 - Frequency: Everyday. 1 - Duration: Ongoing. 1 - Last Use / Amount: Per pt, "a few days ago." 1 - Method of Aquiring: Purchase. 1- Route of Use: Orally. Substance #2 Name of Substance 2: Marijuana. 2 - Age of First Use: UTA 2 - Amount (size/oz): Pt reported, "not much." 2 - Frequency: Ongoing. 2 - Duration: UTA 2 - Last Use / Amount: Yesterday. 2 - Method of Aquiring: Illegal purchase. 2 - Route of Substance Use: Smoke.    ASAM's:  Six Dimensions of Multidimensional Assessment  Dimension 1:  Acute Intoxication and/or Withdrawal Potential:   Dimension 1:  Description of individual's past and current experiences of substance use and withdrawal: 2  Dimension 2:  Biomedical  Conditions and Complications:   Dimension 2:  Description of patient's biomedical conditions and  complications: 3  Dimension 3:  Emotional, Behavioral, or Cognitive Conditions and Complications:  Dimension 3:  Description of emotional, behavioral, or cognitive conditions and complications: 2  Dimension 4:  Readiness to Change:  Dimension 4:  Description of Readiness to Change criteria: 1  Dimension 5:  Relapse, Continued use, or Continued Problem Potential:  Dimension 5:  Relapse, continued use, or continued problem potential critiera description: 2  Dimension 6:  Recovery/Living Environment:  Dimension 6:  Recovery/Iiving environment criteria description: 2  ASAM Severity Score: ASAM's Severity Rating Score: 12  ASAM Recommended Level of Treatment: ASAM Recommended Level of Treatment: Level II Partial Hospitalization Treatment   Substance use Disorder (SUD) Substance Use Disorder (SUD)  Checklist Symptoms of Substance Use: Evidence of tolerance,Large amounts of time spent to obtain, use or recover from the substance(s)  Recommendations  for Services/Supports/Treatments: Recommendations for Services/Supports/Treatments Recommendations For Services/Supports/Treatments: Inpatient Hospitalization  DSM5 Diagnoses: Patient Active Problem List   Diagnosis Date Noted  . Alcohol dependence with withdrawal with complication (HCC) 09/02/2014  . Substance induced mood disorder (HCC) 09/02/2014  . Suicidal ideation   . Alcoholic hepatitis 09/06/2013  . Pneumonia 09/05/2013  . Alcohol abuse 09/05/2013  . Transaminitis 09/05/2013  . Thrombocytopenia, unspecified (HCC) 09/05/2013  . Chronic pain syndrome 09/05/2013    Referrals to Alternative Service(s): Referred to Alternative Service(s):   Place:   Date:   Time:    Referred to Alternative Service(s):   Place:   Date:   Time:    Referred to Alternative Service(s):   Place:   Date:   Time:    Referred to Alternative Service(s):   Place:   Date:   Time:     Redmond Pulling, [facility]  Comprehensive Clinical Assessment (CCA) Screening, Triage and Referral Note  11/01/2020 RAMIL EDGINGTON [id number]  Chief Complaint:  Chief Complaint  Patient presents with  . Alcohol Intoxication   Visit Diagnosis:   Patient Reported Information How did you hear about [place]? No data recorded  Referral name: No data recorded  Referral phone number: No data recorded Whom do you see for routine medical problems? No data recorded  Practice/Facility Name: No data recorded  Practice/Facility Phone Number: No data recorded  Name of Contact: No data recorded  Contact Number: No data recorded  Contact Fax Number: No data recorded  Prescriber Name: No data recorded  Prescriber Address (if known): No data recorded What Is the Reason for Your Visit/Call Today? No data recorded How Long Has This Been Causing You Problems? No data recorded Have You Recently Been in Any Inpatient Treatment (Hospital/Detox/Crisis Center/28-Day Program)? No data recorded  Name/Location of Program/Hospital:No data  recorded  How Long Were You There? No data recorded  When Were You Discharged? No data recorded Have You Ever Received Services From [facility] Before? No data recorded  Who Do You See at [facility]? No data recorded Have You Recently Had Any Thoughts About Hurting Yourself? No data recorded  Are You Planning to Commit Suicide/Harm Yourself At This time?  No data recorded Have you Recently Had Thoughts About Hurting Someone Karolee Ohs? No data recorded  Explanation: No data recorded Have You Used Any Alcohol or Drugs in the Past 24 Hours? No data recorded  How Long Ago Did You Use Drugs or Alcohol?  No data recorded  What Did You Use and How Much? No data recorded What Do You Feel  Would Help You the Most Today? No data recorded Do You Currently Have a Therapist/Psychiatrist? No data recorded  Name of Therapist/Psychiatrist: No data recorded  Have You Been Recently Discharged From Any Office Practice or Programs? No data recorded  Explanation of Discharge From Practice/Program:  No data recorded    CCA Screening Triage Referral Assessment Type of Contact: No data recorded  Is this Initial or Reassessment? No data recorded  Date Telepsych consult ordered in [facility]:  No data recorded  Time Telepsych consult ordered in [facility]:  No data recorded Patient Reported Information Reviewed? No data recorded  Patient Left Without Being Seen? No data recorded  Reason for Not Completing Assessment: No data recorded Collateral Involvement: No data recorded Does Patient Have a Court Appointed Legal Guardian? No data recorded  Name and Contact of Legal Guardian:  No data recorded If Minor and Not Living with Parent(s), Who has Custody? No data recorded Is CPS involved or ever been involved? No data recorded Is APS involved or ever been involved? No data recorded Patient Determined To Be At Risk for Harm To Self or Others Based on Review of Patient Reported Information or Presenting Complaint? No data  recorded  Method: No data recorded  Availability of Means: No data recorded  Intent: No data recorded  Notification Required: No data recorded  Additional Information for Danger to Others Potential:  No data recorded  Additional Comments for Danger to Others Potential:  No data recorded  Are There Guns or Other Weapons in Your Home?  No data recorded   Types of Guns/Weapons: No data recorded   Are These Weapons Safely Secured?                              No data recorded   Who Could Verify You Are Able To Have These Secured:    No data recorded Do You Have any Outstanding Charges, Pending Court Dates, Parole/Probation? No data recorded Contacted To Inform of Risk of Harm To Self or Others: No data recorded Location of Assessment: No data recorded Does Patient Present under Involuntary Commitment? No data recorded  IVC Papers Initial File Date: No data recorded  [place] of Residence: No data recorded Patient Currently Receiving the Following Services: No data recorded  Determination of Need: No data recorded  Options For Referral: No data recorded  Redmond Pulling, [place]     Redmond Pulling, MS, [facility], [facility] Triage Specialist [phone]

## 2020-11-02 DIAGNOSIS — F1994 Other psychoactive substance use, unspecified with psychoactive substance-induced mood disorder: Secondary | ICD-10-CM

## 2020-11-02 NOTE — ED Notes (Signed)
Pt denies SI or HI, pt wanting to go home.

## 2020-11-02 NOTE — ED Notes (Signed)
Pt resting comfortably at this time watching TV. Pt has no other complaints at this time. Pt offered snack and beverage at this time. Pt politely declined.

## 2020-11-02 NOTE — [facility] Suicide Risk Assessment (Cosign Needed Addendum)
Suicide Risk Assessment  Discharge Assessment   [facility] Discharge Suicide Risk Assessment   Principal Problem: Substance induced mood disorder (HCC) Discharge Diagnoses: Principal Problem:   Substance induced mood disorder (HCC)   Frank Page, 50 y.o., male patient presented to [facility] ED with alcohol intoxication and suicidal ideations.  Patient seen via telepsych by this provider; chart reviewed and consulted with Dr. Lucianne Muss on 11/02/20.  On evaluation Frank Page reports, "I'm not suicidal and I darn sure don't want to hurt anyone else."  States he has chronic pain concerns, does not like to take pain medications so self medicates with alcohol.  Endorses many psychosocial stressors, gets disability, overdue on home taxes, but "nothing is that bad to make me want to hurt myself."  On arrival, BAL was 391, UDS was + for THC.  He has a hx for complicated alcohol withdrawal sx so was started on CIWA protocol and given Ativan accordingly. Since admission, he has had some agitation but with the past 24 hours has been cooperative with hospital team.  Sleep and appetite are good, no medication side effects or withdrawal concern. No hand tremors noted, denies nausea or vomiting.  Frank Page is known to [facility] for similar presentation, arrives acutely intoxicated, suicidal ideations but usually clears up with sleep and medical interventions.  He is offered referral for substance abuse, declines inpatient but accepts outpatient resources.  He is also declines starting antidepressant meds or  Librium or gabapentin for home use.    HPI Per EDP Admission Assessment 10/31/2020:  Chief Complaint  Patient presents with  . Alcohol Intoxication     Frank Page is a 50 y.o. male presents emergency department for alcohol intoxication and history of alcohol abuse.  He is brought in by his daughter who feels he needs help.  Patient is alert and oriented at time of my initial exam, he states that he has  had approximately 11 beers today, states that he drinks alcohol to manage the pain he has secondary to several herniated disks of the cervical spine.  He states he has had difficulty following up with his surgeon as directed from prior ED visit.  States he has a follow-up appointment scheduled for the first week of April, but states that he presented to the emergency department because he wants help with his alcohol abuse.  Additionally states he does not take any pain medication, denies illicit drug use, but then states that he uses marijuana in addition to alcohol.  Patient is mildly agitated, shifting around in his bed, will not sit still, occasionally crying out in pain secondary to pain with neck movement.  He denies any HI, but will not answer questions regarding suicidal ideation.  He states that "yeah sure I think about dying, but I did not come here to hurt myself or anybody else".  He will not answer questions about suicidality at home.  I personally reviewed this patient's medical records.  He has history of alcohol abuse with alcohol hepatitis and history of delirium tremens during alcohol withdrawal.  He also has history of suicidal ideation and history of multiple orthopedic injuries including herniated disks in his cervical spine.  Was recently seen in the emergency department with numbness and tingling secondary to herniated disks in his spine and was placed on Medrol Dosepak which he states he has completed.    Total Time spent with patient: 30 minutes  Musculoskeletal: Strength & Muscle Tone: within normal limits Gait & Station:  normal Patient leans: N/A  Psychiatric Specialty Exam: @ROSBYAGE @  Blood pressure (!) 143/98, pulse 97, temperature 98.9 F (37.2 C), temperature source Oral, resp. rate 17, SpO2 96 %.There is no height or weight on file to calculate BMI.  General Appearance: Casual  Eye Contact::  Good  Speech:  Clear and Coherent and Normal Rate409  Volume:  Normal   Mood:  Anxious and mildly anxious, otherwise cooperative  Affect:  Appropriate and Congruent  Thought Process:  Coherent, Goal Directed and Descriptions of Associations: Intact  Orientation:  Full (Time, Place, and Person)  Thought Content:  Logical and in that he no longers has suicidal ideations; admits he need treatment for alcoholism  Suicidal Thoughts:  No  Homicidal Thoughts:  No  Memory:  Immediate;   Good Recent;   Good Remote;   Good  Judgement:  Fair, appears baseline in the setting of alcoholism  Insight:  Fair, appears baseline in the setting of alcoholism   Psychomotor Activity:  Normal  Concentration:  Good  Recall:  Good  Fund of Knowledge:Good  Language: Good  Akathisia:  Negative  Handed:  Right  AIMS (if indicated):     Assets:  [id number] Housing Social Support  Sleep:     Cognition: WNL  ADL's:  Intact   Mental Status Per Nursing Assessment::   On Admission:   Per Nursing Admission Notes 10/31/2020: Pt reports his daughter brought him in for eval due to alcohol intoxication; pt reports his entire body aches and hurts all over and has for years; pt reports he has had 11 beers today and normally drinks 8-9 beers per day   Demographic Factors:  Male  Loss Factors: NA  Historical Factors: Impulsivity  Risk Reduction Factors:   Sense of responsibility to family and Positive social support  Continued Clinical Symptoms:  Alcohol/Substance Abuse/Dependencies  Cognitive Features That Contribute To Risk:  None    Suicide Risk:  Minimal: No identifiable suicidal ideation.  Patients presenting with no risk factors but with morbid ruminations; may be classified as minimal risk based on the severity of the depressive symptoms   Plan Of Care/Follow-up recommendations:  Plan- As per above assessment, there are no current grounds for involuntary commitment at this time.?  Patient is not currently interested in  inpatient services, but expresses agreement to continue outpatient treatment - we have reviewed importance of substance abuse abstinence, potential negative impact substance abuse can have on his relationships and level of functioning, and importance of medication compliance. ?  Disposition: No evidence of imminent risk to self or others at present.   Patient does not meet criteria for psychiatric inpatient admission. Supportive therapy provided about ongoing stressors. Discussed crisis plan, support from social network, calling 911, coming to the Emergency Department, and calling Suicide Hotline.  Plan of care discussed with Dr. 11/02/2020, EDP; Hyacinth Meeker, Dispositions SW; Crissie Reese, RN.    Thomos Lemons, NP 11/02/2020, 1:04 PM

## 2020-11-02 NOTE — Progress Notes (Signed)
CSW added substance abuse treatment resources to the patient discharge instruction. Resources are as listed below:  [facility] Address:  [address] [place], [place], [place] [address] Areas served: [place] and nearby areas Hours:  Saturday Closed  Sunday Closed  Monday Closed  Tuesday 9AM-1PM  Wednesday Closed  Thursday 4-8PM  Friday Closed  Phone: [phone]  [facility] treatment center in [place], [place] Address: [address] [place], [place] [address] Hours:  Saturday 5AM-1:30PM  Sunday 5AM-1:30PM  Monday 5AM-1:30PM  Tuesday 5AM-1:30PM  Wednesday 5AM-1:30PM  Thursday 5AM-1:30PM  Friday 5AM-1:30PM  Phone: [phone]   Crissie Reese, MSW, LCSW-A, LCAS-A Phone: [phone] Disposition/TOC

## 2020-11-02 NOTE — ED Notes (Signed)
Pt is calm and resting. Turned off lights and water provided. Pt has no physical signs of withdrawal. Will continue to monitor pt.

## 2020-11-02 NOTE — Discharge Instructions (Signed)
You have been cleared by our psychiatrist to go home.  I would strongly encourage you to stop drinking alcohol, I have given you a resource list above to help with this process.  Please call these numbers and find a support group and a treatment plan that will be good for you.  You may always return to the emergency department for any severe worsening symptoms, including increasing depression or suicidal thoughtsSubstance Abuse Treatment Programs  Intensive Outpatient Programs [facility]     [address]      [place], [place]                   [phone]       [facility] [address] [place] [address] [place], [place] [phone]  [facility]     (Inpatient and outpatient)     [address]           [phone]    [facility] [phone] (Suboxone and Methadone)  [address]      [place], [place] [address]      [phone]       [address] [place], [place] [phone]  [facility] (Outpatient/Inpatient, Chemical)    (insurance only) [phone]             [facility] (Groups & Residential) [place], [place] [phone]     [facility]     [address]     [place], [place]      [phone]       [facility] (for caregivers and family) [phone] [address] [place], [place] [phone]      Residential Treatment Programs [facility]      [address], [name], [place] [address]  [phone]       [facility] [address], [place], [place] [address] [phone]  [facility]        [phone]       [facility] [phone]  [facility] ([facility]             [address]                                         [place], [place]                                                [phone] or [phone]                               [facility] [address] [place],  [address] [phone]  [facility]    [address]      [place], [place]     [phone]       [facility] [address] [place], [place] [phone]  [facility]   [address] [place], [place] [address]     [phone]      Admissions: 8am-3pm M-F  [facility] ([facility]) [address] [place], [place] [phone]  [facility]: Residential Program [phone] Days)   [place], [place]  [phone] or [phone]     [facility]: [facility] [place], [place] (Walk in Hours over the weekend or by referral)  [facility] [address] [place], [place], [place] [address] [phone]  Crisis Mobile: [facility]:  [phone] (for crisis response 24 hours a day) [facility] Hotline:      [phone] Outpatient Psychiatry and Counseling  [facility]: Mobile Crisis Management 24 hours:  [phone]  [facility] of the [employer] sliding scale fee and walk in schedule: M-F 8am-12pm/1pm-3pm [address]  [place], [place] [address] [phone]  [facility] [address] [place], [place] [address] [phone]  [facility] ([facility] [employer])- new patient walk-in appointments available Monday - Friday 8am -3pm.          [address] [place], [place] [address] [phone] or crisis line- [phone]  [facility] Services/ Intensive Outpatient Therapy Program [address] [place], [place] [address] [phone]  [facility]                  Crisis Services      [phone] [address]     [place], [place] [address]                 [facility]   [facility] [phone]. [address] [place], [place] [address]   [facility]          [address] [place]   [place], [place] [address]       [phone]  [facility] [address] [place], [place] [address] [phone]  [facility]    [address]    [place], [place] [address]     [phone]       Andee Poles, MD     [address]     [place] [address]     [phone]       [facility] [address] [place] [address]  [facility]     [address] [place], [place]      [phone]       [facility] Eulogio Ditch, MD [address] [place], [place] [address] [phone]  [facility]     [address]     [place], [place] [address]     [phone]       [facility] [address] [place], [place] [address] [phone] Resources for Temporary Residential Assistance/Crisis Centers  [facility]) M-F 8am-3pm   [address] [place] [place], [place] [address]   [phone] Services include: laundry, barbering, support groups, case management, phone  & computer access, showers, AA/NA mtgs, mental health/substance abuse nurse, job skills class, disability information, VA assistance, spiritual classes, etc.  [facility] Address:  [address] [place], [place], [place] [address] Areas served: [place] Al[place] and nearby areas Hours:  Saturday Closed  Sunday Closed  Monday Closed  Tuesday 9AM-1PM  Wednesday Closed  Thursday 4-8PM  Friday Closed  Phone: [phone]  [facility] Addiction treatment center in [place], [place] Address: [address] [place], [place] [address] Hours:  Saturday 5AM-1:30PM  Sunday 5AM-1:30PM  Monday 5AM-1:30PM  Tuesday 5AM-1:30PM  Wednesday 5AM-1:30PM  Thursday 5AM-1:30PM  Friday 5AM-1:30PM  Phone: [phone]    HOMELESS SHELTERS  [facility]     [facility]   [address] [place]     [phone]              [employer] (women and children)       [address] [place], [place] [address] [phone] [email or web address] for application and process Application Required  [facility]   [address]    [place] [address]     [phone]                    [facility] [address] [place], [place] [address] [phone] application appt.) Application Required  [facility] (women only)    [address]     [place], [place] [address]     [phone]      Intake starts 6pm daily Need valid ID, SSC, & Police report [employer] [address] [place], [place] [phone] Application Required  [employer] (men only)     [address]      [place], [place]     [phone]       [facility] of the [place] (Pregnant women only) [address] [place], [place] [phone]  [facility]      [address]      [place], [place] [address]     [phone]             [facility] [address] [place], [place] [phone] 90 day commitment/SA/Application process  [facility](men only)     [address]     [place], [place]     [phone]       Check-in at [facility] [address] [place], [place] [address] [phone] Men/Women/Women and Children must be there by 7 pm  [facility] [place], [place] [phone]

## 2020-11-04 DIAGNOSIS — R200 Anesthesia of skin: Secondary | ICD-10-CM | POA: Diagnosis not present

## 2020-11-04 DIAGNOSIS — M4802 Spinal stenosis, cervical region: Secondary | ICD-10-CM | POA: Diagnosis not present

## 2020-11-04 DIAGNOSIS — R202 Paresthesia of skin: Secondary | ICD-10-CM | POA: Diagnosis not present

## 2020-11-07 DIAGNOSIS — R030 Elevated blood-pressure reading, without diagnosis of hypertension: Secondary | ICD-10-CM | POA: Diagnosis not present

## 2020-11-07 DIAGNOSIS — M4802 Spinal stenosis, cervical region: Secondary | ICD-10-CM | POA: Diagnosis not present

## 2020-11-07 DIAGNOSIS — G5601 Carpal tunnel syndrome, right upper limb: Secondary | ICD-10-CM | POA: Diagnosis not present

## 2020-11-07 DIAGNOSIS — G5621 Lesion of ulnar nerve, right upper limb: Secondary | ICD-10-CM | POA: Diagnosis not present

## 2020-11-12 ENCOUNTER — Other Ambulatory Visit: Payer: Self-pay | Admitting: Neurosurgery

## 2020-11-18 NOTE — Pre-Procedure Instructions (Signed)
Surgical Instructions    Your procedure is scheduled on Friday, April 22nd.  Report to [facility] Main Entrance "A" at 7:00 A.M., then check in with the Admitting office.  Call this number if you have problems the morning of surgery:  [phone]   If you have any questions prior to your surgery date call [phone]: Open Monday-Friday 8am-4pm    Remember:  Do not eat or drink after midnight the night before your surgery    Take these medicines the morning of surgery with A SIP OF WATER  baclofen (LIORESAL)   As of today, STOP taking any Aspirin (unless otherwise instructed by your surgeon) Aleve, Naproxen, Ibuprofen, Motrin, Advil, Goody's, BC's, all herbal medications, fish oil, and all vitamins. This includes: sulindac (CLINORIL).                      Do NOT Smoke (Tobacco/Vaping) or drink Alcohol 24 hours prior to your procedure.  If you use a CPAP at night, you may bring all equipment for your overnight stay.   Contacts, glasses, piercing's, hearing aid's, dentures or partials may not be worn into surgery, please bring cases for these belongings.    For patients admitted to the hospital, discharge time will be determined by your treatment team.   Patients discharged the day of surgery will not be allowed to drive home, and someone needs to stay with them for 24 hours.    Special instructions:   [facility]- Preparing For Surgery  Before surgery, you can play an important role. Because skin is not sterile, your skin needs to be as free of germs as possible. You can reduce the number of germs on your skin by washing with CHG (chlorahexidine gluconate) Soap before surgery.  CHG is an antiseptic cleaner which kills germs and bonds with the skin to continue killing germs even after washing.    Oral Hygiene is also important to reduce your risk of infection.  Remember - BRUSH YOUR TEETH THE MORNING OF SURGERY WITH YOUR REGULAR TOOTHPASTE  Please do not use if you have an  allergy to CHG or antibacterial soaps. If your skin becomes reddened/irritated stop using the CHG.  Do not shave (including legs and underarms) for at least 48 hours prior to first CHG shower. It is OK to shave your face.  Please follow these instructions carefully.   1. Shower the NIGHT BEFORE SURGERY and the MORNING OF SURGERY  2. If you chose to wash your hair, wash your hair first as usual with your normal shampoo.  3. After you shampoo, rinse your hair and body thoroughly to remove the shampoo.  4. Use CHG Soap as you would any other liquid soap. You can apply CHG directly to the skin and wash gently with a scrungie or a clean washcloth.   5. Apply the CHG Soap to your body ONLY FROM THE NECK DOWN.  Do not use on open wounds or open sores. Avoid contact with your eyes, ears, mouth and genitals (private parts). Wash Face and genitals (private parts)  with your normal soap.   6. Wash thoroughly, paying special attention to the area where your surgery will be performed.  7. Thoroughly rinse your body with warm water from the neck down.  8. DO NOT shower/wash with your normal soap after using and rinsing off the CHG Soap.  9. Pat yourself dry with a CLEAN TOWEL.  10. Wear CLEAN PAJAMAS to bed the night before surgery  11. Place CLEAN SHEETS on your bed the night before your surgery  12. DO NOT SLEEP WITH PETS.   Day of Surgery: Shower with CHG soap. Do not wear jewelry. Do not wear lotions, powders, colognes, or deodorant. Men may shave face and neck. Do not bring valuables to the hospital. [facility] is not responsible for any belongings or valuables. Wear Clean/Comfortable clothing the morning of surgery Remember to brush your teeth WITH YOUR REGULAR TOOTHPASTE.   Please read over the following fact sheets that you were given.

## 2020-11-19 ENCOUNTER — Other Ambulatory Visit: Payer: Self-pay

## 2020-11-19 ENCOUNTER — Encounter (HOSPITAL_COMMUNITY): Payer: Self-pay

## 2020-11-19 ENCOUNTER — Encounter (HOSPITAL_COMMUNITY)
Admission: RE | Admit: 2020-11-19 | Discharge: 2020-11-19 | Disposition: A | Discharge status: Home / Self Care | Payer: Medicare Other | Source: Ambulatory Visit | Attending: Neurosurgery | Admitting: Neurosurgery

## 2020-11-19 DIAGNOSIS — Z01812 Encounter for preprocedural laboratory examination: Secondary | ICD-10-CM | POA: Insufficient documentation

## 2020-11-19 DIAGNOSIS — Z20822 Contact with and (suspected) exposure to covid-19: Secondary | ICD-10-CM | POA: Diagnosis not present

## 2020-11-19 HISTORY — DX: Dyspnea, unspecified: R06.00

## 2020-11-19 HISTORY — DX: Anxiety disorder, unspecified: F41.9

## 2020-11-19 LAB — CBC
HCT: 47.4 % (ref 39.0–52.0)
Hemoglobin: 16.2 g/dL (ref 13.0–17.0)
MCH: 31.6 pg (ref 26.0–34.0)
MCHC: 34.2 g/dL (ref 30.0–36.0)
MCV: 92.6 fL (ref 80.0–100.0)
Platelets: 320 10*3/uL (ref 150–400)
RBC: 5.12 MIL/uL (ref 4.22–5.81)
RDW: 12.3 % (ref 11.5–15.5)
WBC: 7.9 10*3/uL (ref 4.0–10.5)
nRBC: 0 % (ref 0.0–0.2)

## 2020-11-19 LAB — COMPREHENSIVE METABOLIC PANEL
ALT: 32 U/L (ref 0–44)
AST: 28 U/L (ref 15–41)
Albumin: 3.9 g/dL (ref 3.5–5.0)
Alkaline Phosphatase: 59 U/L (ref 38–126)
Anion gap: 7 (ref 5–15)
BUN: 7 mg/dL (ref 6–20)
CO2: 30 mmol/L (ref 22–32)
Calcium: 9.5 mg/dL (ref 8.9–10.3)
Chloride: 104 mmol/L (ref 98–111)
Creatinine, Ser: 0.78 mg/dL (ref 0.61–1.24)
GFR, Estimated: >60 mL/min (ref >60)
Glucose, Bld: 79 mg/dL (ref 70–99)
Potassium: 4.2 mmol/L (ref 3.5–5.1)
Sodium: 141 mmol/L (ref 135–145)
Total Bilirubin: 0.4 mg/dL (ref 0.3–1.2)
Total Protein: 7 g/dL (ref 6.5–8.1)

## 2020-11-19 LAB — SARS CORONAVIRUS 2 (TAT 6-24 HRS): SARS Coronavirus 2: NEGATIVE

## 2020-11-19 LAB — SURGICAL PCR SCREEN
MRSA, PCR: POSITIVE — AB
Staphylococcus aureus: POSITIVE — AB

## 2020-11-19 NOTE — Progress Notes (Signed)
LVM with Jessica Hanks regarding pt's positive MRSA and MSSA results. Left callback number if needed.  Viviano Simas, RN

## 2020-11-19 NOTE — Progress Notes (Signed)
PCP - Coral Ceo, FNP Cardiologist - denies  PPM/ICD - n/a Device Orders - n/a Rep Notified - n/a  Chest x-ray - 10/31/2020 EKG - 11/01/2020 Stress Test - denies ECHO - denies Cardiac Cath - denies  Sleep Study - denies CPAP - n/a  Blood Thinner Instructions: n/a Aspirin Instructions: n/a  COVID TEST- 11/19/20   Anesthesia review: Yes. EKG Review.  Patient denies shortness of breath, fever, cough and chest pain at PAT appointment   All instructions explained to the patient, with a verbal understanding of the material. Patient agrees to go over the instructions while at home for a better understanding. Patient also instructed to self quarantine after being tested for COVID-19. The opportunity to ask questions was provided.

## 2020-11-21 NOTE — Anesthesia Preprocedure Evaluation (Addendum)
Anesthesia Evaluation  Patient identified by MRN, date of birth, ID band Patient awake    Reviewed: Allergy & Precautions, NPO status , Patient's Chart, lab work & pertinent test results  Airway Mallampati: II  TM Distance: >3 FB Neck ROM: Full    Dental  (+) Dental Advisory Given   Pulmonary Current Smoker,    breath sounds clear to auscultation       Cardiovascular negative cardio ROS   Rhythm:Regular Rate:Normal     Neuro/Psych  Neuromuscular disease    GI/Hepatic GERD  ,(+)     substance abuse  alcohol use,   Endo/Other  negative endocrine ROS  Renal/GU negative Renal ROS     Musculoskeletal   Abdominal   Peds  Hematology negative hematology ROS (+)   Anesthesia Other Findings   Reproductive/Obstetrics                            Lab Results  Component Value Date   WBC 7.9 11/19/2020   HGB 16.2 11/19/2020   HCT 47.4 11/19/2020   MCV 92.6 11/19/2020   PLT 320 11/19/2020   Lab Results  Component Value Date   CREATININE 0.78 11/19/2020   BUN 7 11/19/2020   NA 141 11/19/2020   K 4.2 11/19/2020   CL 104 11/19/2020   CO2 30 11/19/2020    Anesthesia Physical Anesthesia Plan  ASA: II  Anesthesia Plan: General   Post-op Pain Management:    Induction: Intravenous  PONV Risk Score and Plan: 1 and Ondansetron, Dexamethasone, Midazolam and Treatment may vary due to age or medical condition  Airway Management Planned: Oral ETT  Additional Equipment:   Intra-op Plan:   Post-operative Plan: Extubation in OR  Informed Consent: I have reviewed the patients History and Physical, chart, labs and discussed the procedure including the risks, benefits and alternatives for the proposed anesthesia with the patient or authorized representative who has indicated his/her understanding and acceptance.     Dental advisory given  Plan Discussed with: CRNA  Anesthesia Plan  Comments:        Anesthesia Quick Evaluation

## 2020-11-22 ENCOUNTER — Ambulatory Visit (HOSPITAL_COMMUNITY): Payer: Medicare Other | Admitting: Anesthesiology

## 2020-11-22 ENCOUNTER — Ambulatory Visit (HOSPITAL_COMMUNITY): Payer: Medicare Other

## 2020-11-22 ENCOUNTER — Ambulatory Visit (HOSPITAL_COMMUNITY): Payer: Medicare Other | Admitting: Physician Assistant

## 2020-11-22 ENCOUNTER — Encounter (HOSPITAL_COMMUNITY)
Admission: RE | Disposition: A | Discharge status: Home / Self Care | Payer: Self-pay | Source: Home / Self Care | Attending: Neurosurgery

## 2020-11-22 ENCOUNTER — Other Ambulatory Visit: Payer: Self-pay

## 2020-11-22 ENCOUNTER — Ambulatory Visit (HOSPITAL_COMMUNITY)
Admission: RE | Admit: 2020-11-22 | Discharge: 2020-11-22 | Disposition: A | Discharge status: Home / Self Care | Payer: Medicare Other | Attending: Neurosurgery | Admitting: Neurosurgery

## 2020-11-22 ENCOUNTER — Encounter (HOSPITAL_COMMUNITY): Payer: Self-pay | Admitting: Neurosurgery

## 2020-11-22 DIAGNOSIS — Z7952 Long term (current) use of systemic steroids: Secondary | ICD-10-CM | POA: Insufficient documentation

## 2020-11-22 DIAGNOSIS — J189 Pneumonia, unspecified organism: Secondary | ICD-10-CM | POA: Diagnosis not present

## 2020-11-22 DIAGNOSIS — Z880 Allergy status to penicillin: Secondary | ICD-10-CM | POA: Diagnosis not present

## 2020-11-22 DIAGNOSIS — Z79899 Other long term (current) drug therapy: Secondary | ICD-10-CM | POA: Diagnosis not present

## 2020-11-22 DIAGNOSIS — M5020 Other cervical disc displacement, unspecified cervical region: Secondary | ICD-10-CM | POA: Diagnosis present

## 2020-11-22 DIAGNOSIS — M9981 Other biomechanical lesions of cervical region: Secondary | ICD-10-CM | POA: Diagnosis not present

## 2020-11-22 DIAGNOSIS — M50122 Cervical disc disorder at C5-C6 level with radiculopathy: Secondary | ICD-10-CM | POA: Diagnosis not present

## 2020-11-22 DIAGNOSIS — M4802 Spinal stenosis, cervical region: Secondary | ICD-10-CM | POA: Insufficient documentation

## 2020-11-22 DIAGNOSIS — D696 Thrombocytopenia, unspecified: Secondary | ICD-10-CM | POA: Diagnosis not present

## 2020-11-22 DIAGNOSIS — Z791 Long term (current) use of non-steroidal anti-inflammatories (NSAID): Secondary | ICD-10-CM | POA: Diagnosis not present

## 2020-11-22 DIAGNOSIS — M4722 Other spondylosis with radiculopathy, cervical region: Secondary | ICD-10-CM | POA: Diagnosis not present

## 2020-11-22 DIAGNOSIS — K219 Gastro-esophageal reflux disease without esophagitis: Secondary | ICD-10-CM | POA: Diagnosis not present

## 2020-11-22 DIAGNOSIS — Z419 Encounter for procedure for purposes other than remedying health state, unspecified: Secondary | ICD-10-CM

## 2020-11-22 DIAGNOSIS — M4322 Fusion of spine, cervical region: Secondary | ICD-10-CM | POA: Diagnosis not present

## 2020-11-22 DIAGNOSIS — Z981 Arthrodesis status: Secondary | ICD-10-CM | POA: Diagnosis not present

## 2020-11-22 HISTORY — PX: ANTERIOR CERVICAL DECOMP/DISCECTOMY FUSION: SHX1161

## 2020-11-22 LAB — ABO/RH: ABO/RH(D): AB POS

## 2020-11-22 LAB — TYPE AND SCREEN
ABO/RH(D): AB POS
Antibody Screen: NEGATIVE

## 2020-11-22 IMAGING — CR DG CERVICAL SPINE 2 OR 3 VIEWS
3 series · 3 of 3 positions shown · non-contrast
Comparison: None.

CLINICAL DATA: C5-C7 ACDF.

EXAM:
CERVICAL SPINE - 2-3 VIEW

[AP (1 of 3)]
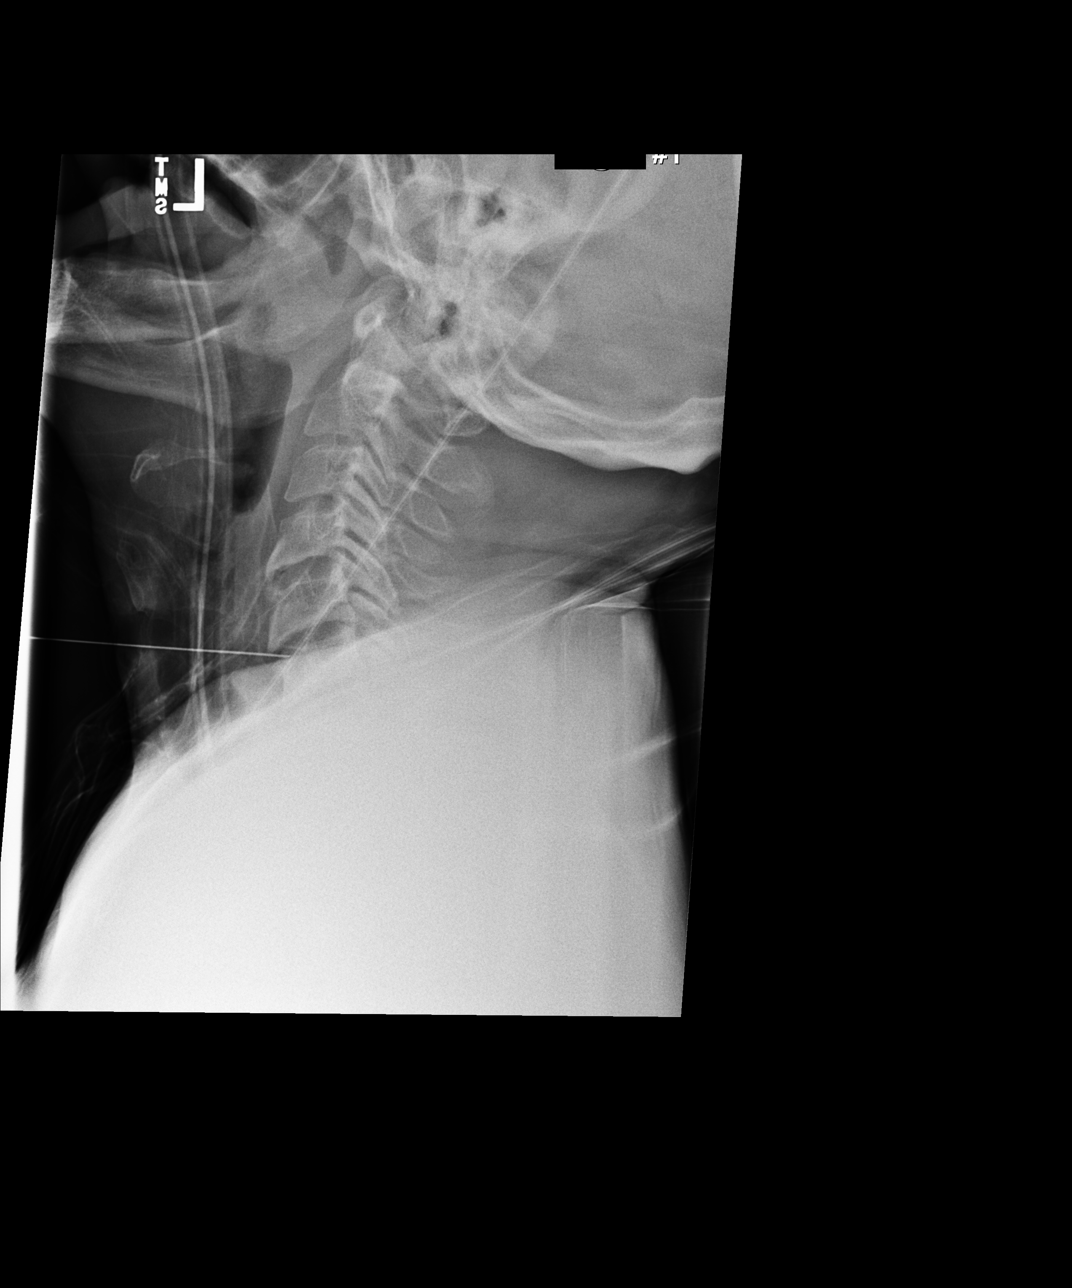

[AP (2 of 3)]
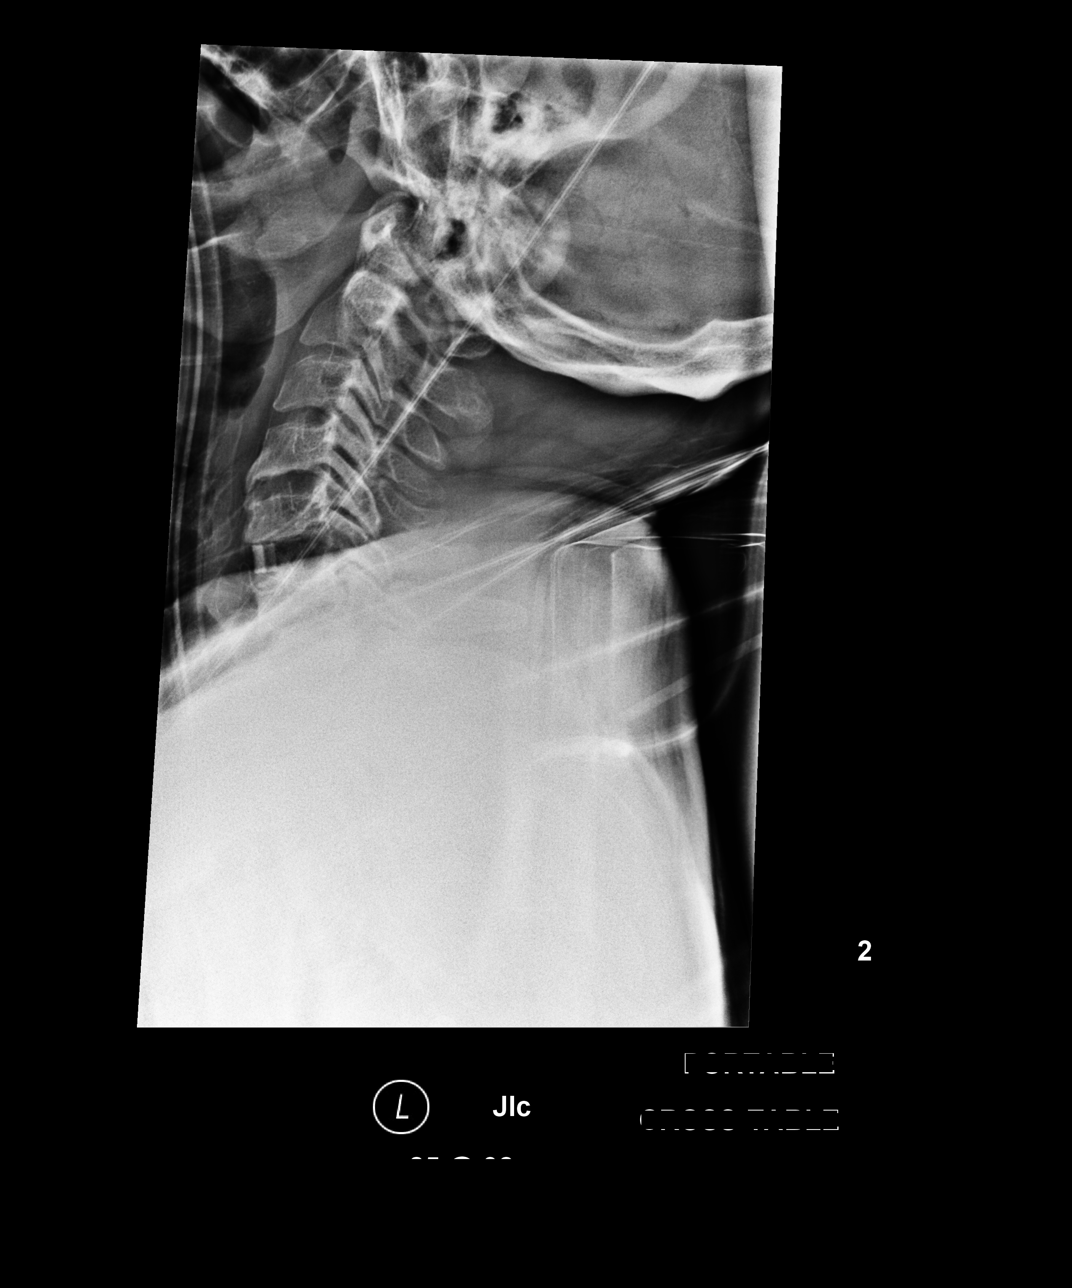

[AP (3 of 3)]
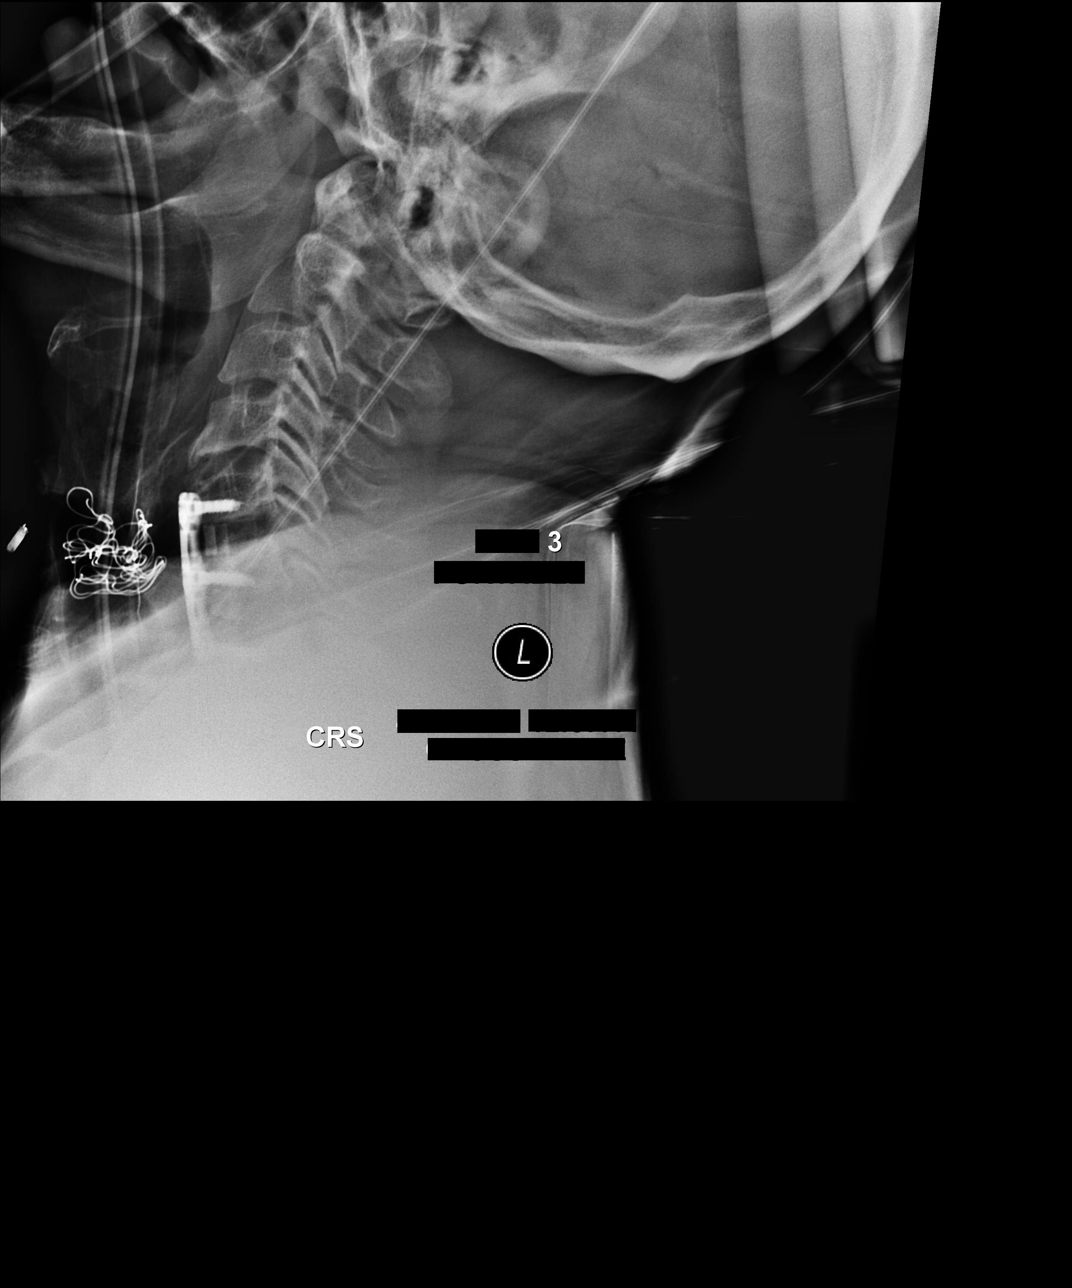

[3 of 3 positions shown; findings below may reference images not displayed]

FINDINGS: Three intraoperative radiographs. The first image demonstrates
medial with tip projecting at the C5-C6 disc space anteriorly.
Second image demonstrates C5-C6 spacer with limited visualization
more inferiorly due to overlapping shoulders. Final image
demonstrates C5-C7 ACDF with plate and screws and gauze ventrally.
Please see procedure note for additional details.
IMPRESSION: C5-C7 intraoperative radiographs, as detailed above.

## 2020-11-22 SURGERY — ANTERIOR CERVICAL DECOMPRESSION/DISCECTOMY FUSION 2 LEVELS
Anesthesia: General | Site: Spine Cervical

## 2020-11-22 MED ORDER — THROMBIN 5000 UNITS EX SOLR
CUTANEOUS | Status: AC
  Filled 2020-11-22: qty 10000

## 2020-11-22 MED ORDER — ACETAMINOPHEN 650 MG RE SUPP
650.0000 mg | RECTAL | Status: DC | PRN
Start: 2020-11-22 — End: 2020-11-22

## 2020-11-22 MED ORDER — MIDAZOLAM HCL 5 MG/5ML IJ SOLN
INTRAMUSCULAR | Status: DC | PRN
  Administered 2020-11-22: 2 mg via INTRAVENOUS

## 2020-11-22 MED ORDER — ONDANSETRON HCL 4 MG/2ML IJ SOLN
INTRAMUSCULAR | Status: DC | PRN
  Administered 2020-11-22: 4 mg via INTRAVENOUS

## 2020-11-22 MED ORDER — VANCOMYCIN HCL 1000 MG IV SOLR
INTRAVENOUS | Status: DC | PRN
  Administered 2020-11-22: 1000 mg via INTRAVENOUS

## 2020-11-22 MED ORDER — FENTANYL CITRATE (PF) 100 MCG/2ML IJ SOLN
INTRAMUSCULAR | Status: AC
  Filled 2020-11-22: qty 2

## 2020-11-22 MED ORDER — ROCURONIUM BROMIDE 10 MG/ML (PF) SYRINGE
PREFILLED_SYRINGE | INTRAVENOUS | Status: DC | PRN
  Administered 2020-11-22: 20 mg via INTRAVENOUS
  Administered 2020-11-22: 50 mg via INTRAVENOUS
  Administered 2020-11-22: 10 mg via INTRAVENOUS
  Administered 2020-11-22: 20 mg via INTRAVENOUS

## 2020-11-22 MED ORDER — PANTOPRAZOLE SODIUM 40 MG PO TBEC
40.0000 mg | DELAYED_RELEASE_TABLET | Freq: Every day | ORAL | Status: DC
  Administered 2020-11-22: 40 mg via ORAL
  Filled 2020-11-22: qty 1

## 2020-11-22 MED ORDER — OXYCODONE HCL 5 MG PO TABS: 5.0000 mg | ORAL_TABLET | Freq: Four times a day (QID) | ORAL | 0 refills | Status: AC | PRN

## 2020-11-22 MED ORDER — ZOLPIDEM TARTRATE 5 MG PO TABS: 5.0000 mg | ORAL_TABLET | Freq: Every evening | ORAL | Status: DC | PRN

## 2020-11-22 MED ORDER — MIDAZOLAM HCL 2 MG/2ML IJ SOLN
INTRAMUSCULAR | Status: AC
  Filled 2020-11-22: qty 2

## 2020-11-22 MED ORDER — OXYCODONE HCL 5 MG PO TABS: 5.0000 mg | ORAL_TABLET | ORAL | Status: DC | PRN

## 2020-11-22 MED ORDER — AMISULPRIDE (ANTIEMETIC) 5 MG/2ML IV SOLN: 10.0000 mg | Freq: Once | INTRAVENOUS | Status: DC | PRN

## 2020-11-22 MED ORDER — MENTHOL 3 MG MT LOZG: 1.0000 | LOZENGE | OROMUCOSAL | Status: DC | PRN

## 2020-11-22 MED ORDER — GABAPENTIN 300 MG PO CAPS
300.0000 mg | ORAL_CAPSULE | Freq: Once | ORAL | Status: AC
  Administered 2020-11-22: 300 mg via ORAL
  Filled 2020-11-22: qty 1

## 2020-11-22 MED ORDER — OXYCODONE HCL 5 MG PO TABS: 10.0000 mg | ORAL_TABLET | ORAL | Status: DC | PRN

## 2020-11-22 MED ORDER — PROPOFOL 10 MG/ML IV BOLUS
INTRAVENOUS | Status: DC | PRN
  Administered 2020-11-22: 130 mg via INTRAVENOUS

## 2020-11-22 MED ORDER — LACTATED RINGERS IV SOLN: INTRAVENOUS | Status: DC

## 2020-11-22 MED ORDER — FENTANYL CITRATE (PF) 250 MCG/5ML IJ SOLN
INTRAMUSCULAR | Status: DC | PRN
  Administered 2020-11-22: 50 ug via INTRAVENOUS
  Administered 2020-11-22: 150 ug via INTRAVENOUS
  Administered 2020-11-22: 50 ug via INTRAVENOUS

## 2020-11-22 MED ORDER — LIDOCAINE 2% (20 MG/ML) 5 ML SYRINGE
INTRAMUSCULAR | Status: DC | PRN
  Administered 2020-11-22: 50 mg via INTRAVENOUS

## 2020-11-22 MED ORDER — FENTANYL CITRATE (PF) 100 MCG/2ML IJ SOLN
25.0000 ug | INTRAMUSCULAR | Status: DC | PRN
  Administered 2020-11-22 (×3): 50 ug via INTRAVENOUS

## 2020-11-22 MED ORDER — LIDOCAINE-EPINEPHRINE 0.5 %-1:200000 IJ SOLN
INTRAMUSCULAR | Status: AC
  Filled 2020-11-22: qty 1

## 2020-11-22 MED ORDER — DIAZEPAM 5 MG PO TABS: 5.0000 mg | ORAL_TABLET | Freq: Four times a day (QID) | ORAL | Status: DC | PRN

## 2020-11-22 MED ORDER — ORAL CARE MOUTH RINSE: 15.0000 mL | Freq: Once | OROMUCOSAL | Status: AC

## 2020-11-22 MED ORDER — ACETAMINOPHEN 325 MG PO TABS: 650.0000 mg | ORAL_TABLET | ORAL | Status: DC | PRN

## 2020-11-22 MED ORDER — PHENYLEPHRINE 40 MCG/ML (10ML) SYRINGE FOR IV PUSH (FOR BLOOD PRESSURE SUPPORT)
PREFILLED_SYRINGE | INTRAVENOUS | Status: DC | PRN
  Administered 2020-11-22: 80 ug via INTRAVENOUS

## 2020-11-22 MED ORDER — IPRATROPIUM-ALBUTEROL 0.5-2.5 (3) MG/3ML IN SOLN
RESPIRATORY_TRACT | Status: AC
  Filled 2020-11-22: qty 3

## 2020-11-22 MED ORDER — LIDOCAINE-EPINEPHRINE 0.5 %-1:200000 IJ SOLN
INTRAMUSCULAR | Status: DC | PRN
  Administered 2020-11-22: 3 mL

## 2020-11-22 MED ORDER — OXYCODONE HCL ER 10 MG PO T12A
10.0000 mg | EXTENDED_RELEASE_TABLET | Freq: Two times a day (BID) | ORAL | Status: DC
  Administered 2020-11-22: 10 mg via ORAL
  Filled 2020-11-22: qty 1

## 2020-11-22 MED ORDER — SODIUM CHLORIDE 0.9 % IV SOLN
250.0000 mL | INTRAVENOUS | Status: DC
  Administered 2020-11-22: 250 mL via INTRAVENOUS

## 2020-11-22 MED ORDER — 0.9 % SODIUM CHLORIDE (POUR BTL) OPTIME
TOPICAL | Status: DC | PRN
  Administered 2020-11-22: 1000 mL

## 2020-11-22 MED ORDER — ACETAMINOPHEN 500 MG PO TABS
1000.0000 mg | ORAL_TABLET | Freq: Once | ORAL | Status: AC
  Administered 2020-11-22: 1000 mg via ORAL
  Filled 2020-11-22: qty 2

## 2020-11-22 MED ORDER — SODIUM CHLORIDE 0.9% FLUSH: 3.0000 mL | INTRAVENOUS | Status: DC | PRN

## 2020-11-22 MED ORDER — ONDANSETRON HCL 4 MG/2ML IJ SOLN: 4.0000 mg | Freq: Four times a day (QID) | INTRAMUSCULAR | Status: DC | PRN

## 2020-11-22 MED ORDER — PHENOL 1.4 % MT LIQD: 1.0000 | OROMUCOSAL | Status: DC | PRN

## 2020-11-22 MED ORDER — BACLOFEN 10 MG PO TABS
20.0000 mg | ORAL_TABLET | Freq: Two times a day (BID) | ORAL | Status: DC
  Administered 2020-11-22: 20 mg via ORAL
  Filled 2020-11-22: qty 2

## 2020-11-22 MED ORDER — PHENYLEPHRINE HCL-NACL 10-0.9 MG/250ML-% IV SOLN
INTRAVENOUS | Status: DC | PRN
  Administered 2020-11-22: 25 ug/min via INTRAVENOUS

## 2020-11-22 MED ORDER — IPRATROPIUM-ALBUTEROL 0.5-2.5 (3) MG/3ML IN SOLN
3.0000 mL | Freq: Once | RESPIRATORY_TRACT | Status: AC
  Administered 2020-11-22: 3 mL via RESPIRATORY_TRACT
  Filled 2020-11-22: qty 3

## 2020-11-22 MED ORDER — HEMOSTATIC AGENTS (NO CHARGE) OPTIME
TOPICAL | Status: DC | PRN
  Administered 2020-11-22: 1 via TOPICAL

## 2020-11-22 MED ORDER — ACETAMINOPHEN 500 MG PO TABS: 1000.0000 mg | ORAL_TABLET | Freq: Four times a day (QID) | ORAL | Status: DC

## 2020-11-22 MED ORDER — PROPOFOL 10 MG/ML IV BOLUS
INTRAVENOUS | Status: AC
  Filled 2020-11-22: qty 20

## 2020-11-22 MED ORDER — ONDANSETRON HCL 4 MG PO TABS: 4.0000 mg | ORAL_TABLET | Freq: Four times a day (QID) | ORAL | Status: DC | PRN

## 2020-11-22 MED ORDER — FENTANYL CITRATE (PF) 250 MCG/5ML IJ SOLN
INTRAMUSCULAR | Status: AC
  Filled 2020-11-22: qty 5

## 2020-11-22 MED ORDER — CHLORHEXIDINE GLUCONATE 0.12 % MT SOLN
15.0000 mL | Freq: Once | OROMUCOSAL | Status: AC
  Administered 2020-11-22: 15 mL via OROMUCOSAL
  Filled 2020-11-22: qty 15

## 2020-11-22 MED ORDER — THROMBIN 5000 UNITS EX SOLR
CUTANEOUS | Status: DC | PRN
  Administered 2020-11-22 (×2): 5000 [IU] via TOPICAL

## 2020-11-22 MED ORDER — VANCOMYCIN HCL IN DEXTROSE 1-5 GM/200ML-% IV SOLN
INTRAVENOUS | Status: AC
  Filled 2020-11-22: qty 200

## 2020-11-22 MED ORDER — DEXAMETHASONE SODIUM PHOSPHATE 10 MG/ML IJ SOLN
INTRAMUSCULAR | Status: DC | PRN
  Administered 2020-11-22: 10 mg via INTRAVENOUS

## 2020-11-22 MED ORDER — METHOCARBAMOL 500 MG PO TABS: 500.0000 mg | ORAL_TABLET | Freq: Two times a day (BID) | ORAL | Status: DC | PRN

## 2020-11-22 MED ORDER — SUGAMMADEX SODIUM 200 MG/2ML IV SOLN
INTRAVENOUS | Status: DC | PRN
  Administered 2020-11-22: 300 mg via INTRAVENOUS

## 2020-11-22 MED ORDER — POTASSIUM CHLORIDE IN NACL 20-0.9 MEQ/L-% IV SOLN: INTRAVENOUS | Status: DC

## 2020-11-22 MED ORDER — SODIUM CHLORIDE 0.9% FLUSH
3.0000 mL | Freq: Two times a day (BID) | INTRAVENOUS | Status: DC
  Administered 2020-11-22: 3 mL via INTRAVENOUS

## 2020-11-22 SURGICAL SUPPLY — 52 items
ADH SKN CLS APL DERMABOND .7 (GAUZE/BANDAGES/DRESSINGS) ×1
BAND INSRT 18 STRL LF DISP RB (MISCELLANEOUS) ×2
BAND RUBBER #18 3X1/16 STRL (MISCELLANEOUS) ×4 IMPLANT
BUR DRUM 4.0 (BURR) ×2 IMPLANT
BUR MATCHSTICK NEURO 3.0 LAGG (BURR) ×2 IMPLANT
CANISTER SUCT 3000ML PPV (MISCELLANEOUS) ×2 IMPLANT
CARTRIDGE OIL MAESTRO DRILL (MISCELLANEOUS) ×1 IMPLANT
COVER WAND RF STERILE (DRAPES) ×2 IMPLANT
DECANTER SPIKE VIAL GLASS SM (MISCELLANEOUS) ×2 IMPLANT
DERMABOND ADVANCED (GAUZE/BANDAGES/DRESSINGS) ×1
DERMABOND ADVANCED .7 DNX12 (GAUZE/BANDAGES/DRESSINGS) ×1 IMPLANT
DIFFUSER DRILL AIR PNEUMATIC (MISCELLANEOUS) ×2 IMPLANT
DRAPE HALF SHEET 40X57 (DRAPES) IMPLANT
DRAPE LAPAROTOMY 100X72 PEDS (DRAPES) ×2 IMPLANT
DRAPE MICROSCOPE LEICA (MISCELLANEOUS) ×2 IMPLANT
DURAPREP 6ML APPLICATOR 50/CS (WOUND CARE) ×2 IMPLANT
ELECT COATED BLADE 2.86 ST (ELECTRODE) ×2 IMPLANT
ELECT REM PT RETURN 9FT ADLT (ELECTROSURGICAL) ×2
ELECTRODE REM PT RTRN 9FT ADLT (ELECTROSURGICAL) ×1 IMPLANT
GAUZE 4X4 16PLY RFD (DISPOSABLE) IMPLANT
GLOVE ECLIPSE 6.5 STRL STRAW (GLOVE) ×2 IMPLANT
GLOVE EXAM NITRILE XL STR (GLOVE) IMPLANT
GOWN STRL REUS W/ TWL LRG LVL3 (GOWN DISPOSABLE) ×3 IMPLANT
GOWN STRL REUS W/ TWL XL LVL3 (GOWN DISPOSABLE) IMPLANT
GOWN STRL REUS W/TWL 2XL LVL3 (GOWN DISPOSABLE) IMPLANT
GOWN STRL REUS W/TWL LRG LVL3 (GOWN DISPOSABLE) ×6
GOWN STRL REUS W/TWL XL LVL3 (GOWN DISPOSABLE)
KIT BASIN OR (CUSTOM PROCEDURE TRAY) ×2 IMPLANT
KIT TURNOVER KIT B (KITS) ×2 IMPLANT
NDL HYPO 25X1 1.5 SAFETY (NEEDLE) ×1 IMPLANT
NDL SPNL 22GX3.5 QUINCKE BK (NEEDLE) ×1 IMPLANT
NEEDLE HYPO 25X1 1.5 SAFETY (NEEDLE) ×2 IMPLANT
NEEDLE SPNL 22GX3.5 QUINCKE BK (NEEDLE) ×2 IMPLANT
NS IRRIG 1000ML POUR BTL (IV SOLUTION) ×2 IMPLANT
OIL CARTRIDGE MAESTRO DRILL (MISCELLANEOUS) ×2
PACK LAMINECTOMY NEURO (CUSTOM PROCEDURE TRAY) ×2 IMPLANT
PAD ARMBOARD 7.5X6 YLW CONV (MISCELLANEOUS) ×6 IMPLANT
PLATE ACP 1.6X40 2LVL (Plate) ×1 IMPLANT
SCREW ACP 3.5 X 13 S/D VARIA (Screw) ×2 IMPLANT
SCREW ACP 3.5X13 S/D VAR ANGLE (Screw) IMPLANT
SCREW ACP ST VARI 3.5X13 (Screw) ×3 IMPLANT
SCREW ACP VA ST 3.5X15 (Screw) ×2 IMPLANT
SPACER ACF PARALLEL 7MM (Bone Implant) ×1 IMPLANT
SPACER CC-ACF 8MM PARALLEL (Bone Implant) ×1 IMPLANT
SPONGE INTESTINAL PEANUT (DISPOSABLE) ×2 IMPLANT
SPONGE SURGIFOAM ABS GEL SZ50 (HEMOSTASIS) ×2 IMPLANT
SUT VIC AB 0 CT1 27 (SUTURE)
SUT VIC AB 0 CT1 27XBRD ANTBC (SUTURE) IMPLANT
SUT VIC AB 3-0 SH 8-18 (SUTURE) ×2 IMPLANT
TOWEL GREEN STERILE (TOWEL DISPOSABLE) ×2 IMPLANT
TOWEL GREEN STERILE FF (TOWEL DISPOSABLE) ×2 IMPLANT
WATER STERILE IRR 1000ML POUR (IV SOLUTION) ×2 IMPLANT

## 2020-11-22 NOTE — Discharge Summary (Signed)
Physician Discharge Summary  Patient ID: ROI JAFARI MRN: [id number] DOB/AGE: [birth year] 49 y.o.  Admit date: 11/22/2020 Discharge date: 11/22/2020  Admission Diagnoses:cervical spondylosis with radiculopathy  Discharge Diagnoses: same C5/6,6/7 Active Problems:   HNP (herniated nucleus pulposus), cervical   Discharged Condition: good  Hospital Course: Mr. Skow was admitted and taken to the operating room for an uncomplicated ACDF at C5/6,6/7. His speaking voice is strong, moving all extremities well. His wound is without signs of infection. He is ambulating, voiding, and tolerating a regular diet  Treatments: surgery: ACDF C5/6,6/7 nuvasive plate,  Discharge Exam: Blood pressure 125/85, pulse 84, temperature 98 F (36.7 C), temperature source Oral, resp. rate 17, height 5\' 2"  (1.575 m), weight 58.4 kg, SpO2 98 %. General appearance: alert, cooperative, appears stated age and no distress  Disposition: Discharge disposition: 01-Home or Self Care      Foraminal stenosis of cervical region Discharge Instructions    Incentive spirometry RT   Complete by: As directed      Allergies as of 11/22/2020      Reactions   Penicillins Anaphylaxis      Medication List    TAKE these medications   baclofen 20 MG tablet Commonly known as: LIORESAL Take 20 mg by mouth 2 (two) times daily.   methocarbamol 500 MG tablet Commonly known as: ROBAXIN Take 1 tablet (500 mg total) by mouth 2 (two) times daily as needed for muscle spasms.   methylPREDNISolone 4 MG Tbpk tablet Commonly known as: MEDROL DOSEPAK Taper over 6 days please   naproxen 500 MG tablet Commonly known as: Naprosyn Take 1 tablet (500 mg total) by mouth 2 (two) times daily with a meal.   oxyCODONE 5 MG immediate release tablet Commonly known as: Oxy IR/ROXICODONE Take 1 tablet (5 mg total) by mouth every 6 (six) hours as needed for up to 7 days for moderate pain ((score 4 to 6)).   pantoprazole 40 MG  tablet Commonly known as: PROTONIX Take 1 tablet (40 mg total) by mouth daily.   sulindac 200 MG tablet Commonly known as: CLINORIL Take 200 mg by mouth 2 (two) times daily.        Signed4/24/2022 11/22/2020, 4:05 PM

## 2020-11-22 NOTE — Anesthesia Postprocedure Evaluation (Signed)
Anesthesia Post Note  Patient: Frank Page  Procedure(s) Performed: Cervical Five-Six Cervical Six-Seven Anterior Cervical Decompression/Discectomy/Fusion (N/A Spine Cervical)     Patient location during evaluation: PACU Anesthesia Type: General Level of consciousness: awake and alert Pain management: pain level controlled Vital Signs Assessment: post-procedure vital signs reviewed and stable Respiratory status: spontaneous breathing, nonlabored ventilation, respiratory function stable and patient connected to nasal cannula oxygen Cardiovascular status: blood pressure returned to baseline and stable Postop Assessment: no apparent nausea or vomiting Anesthetic complications: no   No complications documented.  Last Vitals:  Vitals:   11/22/20 1356 11/22/20 1423  BP: 123/82 125/85  Pulse: 100 84  Resp: 16 17  Temp: 36.4 C 36.7 C  SpO2: 96% 98%    Last Pain:  Vitals:   11/22/20 1423  TempSrc: Oral  PainSc:                  Kennieth Rad

## 2020-11-22 NOTE — Transfer of Care (Signed)
Immediate Anesthesia Transfer of Care Note  Patient: Frank Page  Procedure(s) Performed: Cervical Five-Six Cervical Six-Seven Anterior Cervical Decompression/Discectomy/Fusion (N/A Spine Cervical)  Patient Location: PACU  Anesthesia Type:General  Level of Consciousness: awake, alert  and oriented  Airway & Oxygen Therapy: Patient Spontanous Breathing and Patient connected to nasal cannula oxygen  Post-op Assessment: Report given to RN, Post -op Vital signs reviewed and stable and Patient moving all extremities X 4  Post vital signs: Reviewed and stable  Last Vitals:  Vitals Value Taken Time  BP 124/66 11/22/20 1256  Temp    Pulse 96 11/22/20 1257  Resp 28 11/22/20 1257  SpO2 100 % 11/22/20 1257  Vitals shown include unvalidated device data.  Last Pain:  Vitals:   11/22/20 0731  PainSc: 8       Patients Stated Pain Goal: 4 (11/22/20 0731)  Complications: No complications documented.

## 2020-11-22 NOTE — Anesthesia Procedure Notes (Signed)
Procedure Name: Intubation Date/Time: 11/22/2020 9:19 AM Performed by: Mariea Clonts, CRNA Pre-anesthesia Checklist: Patient identified, Emergency Drugs available, Suction available and Patient being monitored Patient Re-evaluated:Patient Re-evaluated prior to induction Oxygen Delivery Method: Circle System Utilized Preoxygenation: Pre-oxygenation with 100% oxygen Induction Type: IV induction Ventilation: Mask ventilation without difficulty Laryngoscope Size: Mac and 3 Grade View: Grade I Tube type: Oral Tube size: 7.0 mm Number of attempts: 1 Airway Equipment and Method: Stylet and Oral airway Placement Confirmation: ETT inserted through vocal cords under direct vision,  positive ETCO2 and breath sounds checked- equal and bilateral Tube secured with: Tape Dental Injury: Teeth and Oropharynx as per pre-operative assessment

## 2020-11-22 NOTE — Discharge Instructions (Addendum)
Wound Care Leave incision open to air. You may shower. Do not scrub directly on incision.  Do not put any creams, lotions, or ointments on incision. Activity Walk each and every day, increasing distance each day. No lifting greater than 5 lbs.  Avoid excessive neck motion. No driving for 2 weeks; may ride as a passenger locally.  Diet Resume your normal diet.  Return to Work Will be discussed at you follow up appointment. Call Your Doctor If Any of These Occur Redness, drainage, or swelling at the wound.  Temperature greater than 101 degrees. Severe pain not relieved by pain medication. Increased difficulty swallowing. Incision starts to come apart. Follow Up Appt Call today for appointment in 3-4 weeks ([phone]) or for problems.  If you have any hardware placed in your spine, you will need an x-ray before your appointment.         Anterior Cervical Fusion Care After Pinching of the nerves is a common cause of long-term pain. When this happens, a procedure called an anterior cervical fusion is sometimes performed. It relieves the pressure on the pinched nerve roots or spinal cord in the neck. An anterior cervical fusion means that the operation is done through the front (anterior) of your neck to fuse bones in your neck together. This procedure is done to relieve the pressure on pinched nerve roots or spinal cord. This operation is done to control the movement of your spine, which may be pressing on the nerves. This may relieve the pain. The procedure that stops the movement of the spine is called a fusion. The cut by the surgeon (incision) is usually within a skin fold line under your chin. After moving the neck muscles gently apart, the neurosurgeon uses an operating microscope and removes the injured intervertebral disk (the cushion or pad of tissue between the bones of the spine). This takes the pressure off the nerves or spinal cord. This is called decompression. The area  where the disc was removed is then filled with a bone graft. The graft will fuse the vertebrae together over time. This means it causes the vertebral bodies to grow together. The bone graft may be obtained from your own bone (your hip for example), or may be obtained from a bone bank. Receiving bone from a bone bank is similar to a blood bank, only the bone comes from human donors who have recently died. This type of graft is referred to as allograft bone. The preformed bone plug is safe and will not be rejected by your body. It does not contain blood cells. In some cases, the surgeon may use hardware in your neck to help stabilize it. This means that metal plates or pins or screws may be used to:  Provide extra support to the neck.   Help the bones to grow together more easily.  A cervical fusion procedure takes a couple hours to several hours, depending on what needs to be done. Your caregiver will be able to answer your questions for you. HOME CARE INSTRUCTIONS   It will be normal to have a sore throat and have difficulty swallowing foods for a couple weeks following surgery. See your caregiver if this seems to be getting worse rather than better.   You may resume normal diet and activities as directed or allowed. Generally, walking and stair climbing are fine. Avoid lifting more than ten pounds and do no lifting above your head.   If given a cervical collar, remove only for bathing and   eating, or as directed.   Use only showers for cleaning up, with no bathing, until seen.   You may apply ice to the surgical or bone donor site for 15 to 20 minutes each hour while awake for the first couple days following surgery. Put the ice in a plastic bag and place a towel between the bag of ice and your skin.   Change dressings if necessary or as directed.   You may drive in 10 days   Take prescribed medication as directed. Only take over-the-counter or prescription medicines for pain, discomfort, or  fever as directed by your caregiver.   Make an appointment to see your caregiver for suture or staple removal when instructed.   If physical therapy was prescribed, follow your caregiver's directions.  SEEK IMMEDIATE MEDICAL CARE IF:  There is redness, swelling, or increasing pain in the wound.   There is pus coming from the wound.   An unexplained oral temperature over 102 F (38.9 C) develops.   There is a bad smell coming from the wound or dressing.   You have swelling in your calf or leg.   You develop shortness of breath or chest pain.   The wound edges break open after sutures or staples have been removed.   Your pain is not controlled with medicine.   You seem to be getting worse rather than better.  Document Released: 03/03/2004 Document Revised: 04/01/2011 Document Reviewed: 05/09/2008 [employer] Patient Information 2012 [employer]. 
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
## 2020-11-22 NOTE — Plan of Care (Signed)
Patient is discharged from room 3C04 at this time. Alert and in stable condition. IV site d/c'd and instructions read to patient with understanding verbalized and all questions answered. Left unit via wheelchair with all belongings at side. 

## 2020-11-24 ENCOUNTER — Encounter (HOSPITAL_COMMUNITY): Payer: Self-pay | Admitting: Neurosurgery

## 2020-11-27 NOTE — Op Note (Signed)
11/22/2020  12:49 PM  PATIENT:  Frank Page  50 y.o. male  PRE-OPERATIVE DIAGNOSIS:  Foraminal stenosis of cervical region C5/6, C6/7  POST-OPERATIVE DIAGNOSIS:  Foraminal stenosis of cervical region C5/6, C6/7  PROCEDURE:  Anterior Cervical decompression C5/6,6/7 Arthrodesis C5-7 with  structural allografts Anterior instrumentation(Nuvasive) C5-7  SURGEON:   Surgeon(s): Coletta Memos, MD Dawley, Alan Mulder, DO   ASSISTANTS:Dawley, Kendell Bane  ANESTHESIA:   general  EBL:  No intake/output data recorded.  BLOOD ADMINISTERED:none  CELL SAVER GIVEN:none  COUNT:per nursing  DRAINS: none   SPECIMEN:  No Specimen  DICTATION: [name] was taken to the operating room, intubated, and placed under general anesthesia without difficulty. He was positioned supine with his head in slight extension on a horseshoe headrest. The neck was prepped and draped in a sterile manner. I infiltrated 5 cc's 1/2%lidocaine/1:200,000 strength epinephrine into the planned incision starting from the midline to the medial border of the left sternocleidomastoid muscle. I opened the incision with a 10 blade and dissected sharply through soft tissue to the platysma. I dissected in the plane superior to the platysma both rostrally and caudally. I then opened the platysma in a horizontal fashion with Metzenbaum scissors, and dissected in the inferior plane rostrally and caudally. With both blunt and sharp technique I created an avascular corridor to the cervical spine. I placed a spinal needle(s) in the disc space at 5/6 . I then reflected the longus colli from C5 to C7 and placed self retaining retractors. I opened the disc space(s) at 5/6,6/7 with a 15 blade. I removed disc with curettes, Kerrison punches, and the drill. Using the drill I removed osteophytes and prepared for the decompression.  I decompressed the spinal canal and the C6, and C7 root(s) with the drill, Kerrison punches, and the curettes. I used the  microscope to aid in microdissection. I removed the posterior longitudinal ligament to fully expose and decompress the thecal sac. I exposed the roots laterally taking down the 5/6,6/7 uncovertebral joints. With the decompression complete we moved on to the arthrodesis. We used the drill to level the surfaces of C5,6,and7. I removed soft tissue to prepare the disc space and the bony surfaces. I measured the space and placed structural allografts into the disc spaces.  I then placed the anterior instrumentation. I placed 2 screws in each vertebral body through the plate. I locked the screws into place. Intraoperative xray showed the graft, plate, and screws to be in good position. I irrigated the wound, achieved hemostasis, and closed the wound in layers. I approximated the platysma, and the subcuticular plane with vicryl sutures. I used Dermabond for a sterile dressing.   PLAN OF CARE: Admit for overnight observation  PATIENT DISPOSITION:  PACU - hemodynamically stable.   Delay start of Pharmacological VTE agent (>24hrs) due to surgical blood loss or risk of bleeding:  no

## 2020-12-06 ENCOUNTER — Emergency Department (HOSPITAL_COMMUNITY): Payer: Medicare Other

## 2020-12-06 ENCOUNTER — Other Ambulatory Visit: Payer: Self-pay

## 2020-12-06 ENCOUNTER — Emergency Department (HOSPITAL_COMMUNITY)
Admission: EM | Admit: 2020-12-06 | Discharge: 2020-12-06 | Disposition: A | Discharge status: Home / Self Care | Payer: Medicare Other | Attending: Emergency Medicine | Admitting: Emergency Medicine

## 2020-12-06 ENCOUNTER — Encounter (HOSPITAL_COMMUNITY): Payer: Self-pay | Admitting: Emergency Medicine

## 2020-12-06 DIAGNOSIS — R9431 Abnormal electrocardiogram [ECG] [EKG]: Secondary | ICD-10-CM | POA: Diagnosis not present

## 2020-12-06 DIAGNOSIS — R519 Headache, unspecified: Secondary | ICD-10-CM | POA: Diagnosis not present

## 2020-12-06 DIAGNOSIS — M25511 Pain in right shoulder: Secondary | ICD-10-CM | POA: Diagnosis not present

## 2020-12-06 DIAGNOSIS — Z20822 Contact with and (suspected) exposure to covid-19: Secondary | ICD-10-CM | POA: Insufficient documentation

## 2020-12-06 DIAGNOSIS — Z9889 Other specified postprocedural states: Secondary | ICD-10-CM | POA: Diagnosis not present

## 2020-12-06 DIAGNOSIS — M4802 Spinal stenosis, cervical region: Secondary | ICD-10-CM | POA: Diagnosis not present

## 2020-12-06 DIAGNOSIS — M5412 Radiculopathy, cervical region: Secondary | ICD-10-CM | POA: Diagnosis not present

## 2020-12-06 DIAGNOSIS — R29818 Other symptoms and signs involving the nervous system: Secondary | ICD-10-CM | POA: Diagnosis not present

## 2020-12-06 DIAGNOSIS — S06340A Traumatic hemorrhage of right cerebrum without loss of consciousness, initial encounter: Secondary | ICD-10-CM | POA: Diagnosis not present

## 2020-12-06 DIAGNOSIS — M50121 Cervical disc disorder at C4-C5 level with radiculopathy: Secondary | ICD-10-CM | POA: Diagnosis not present

## 2020-12-06 DIAGNOSIS — F17210 Nicotine dependence, cigarettes, uncomplicated: Secondary | ICD-10-CM | POA: Diagnosis not present

## 2020-12-06 DIAGNOSIS — R531 Weakness: Secondary | ICD-10-CM | POA: Diagnosis not present

## 2020-12-06 LAB — BASIC METABOLIC PANEL
Anion gap: 7 (ref 5–15)
BUN: 10 mg/dL (ref 6–20)
CO2: 26 mmol/L (ref 22–32)
Calcium: 9.3 mg/dL (ref 8.9–10.3)
Chloride: 109 mmol/L (ref 98–111)
Creatinine, Ser: 0.72 mg/dL (ref 0.61–1.24)
GFR, Estimated: >60 mL/min (ref >60)
Glucose, Bld: 116 mg/dL — ABNORMAL HIGH (ref 70–99)
Potassium: 3.5 mmol/L (ref 3.5–5.1)
Sodium: 142 mmol/L (ref 135–145)

## 2020-12-06 LAB — TROPONIN I (HIGH SENSITIVITY)
Troponin I (High Sensitivity): 3 ng/L (ref <18)
Troponin I (High Sensitivity): 3 ng/L (ref <18)

## 2020-12-06 LAB — CBC WITH DIFFERENTIAL/PLATELET
Abs Immature Granulocytes: 0.05 10*3/uL (ref 0.00–0.07)
Basophils Absolute: 0.1 10*3/uL (ref 0.0–0.1)
Basophils Relative: 1 %
Eosinophils Absolute: 0.5 10*3/uL (ref 0.0–0.5)
Eosinophils Relative: 5 %
HCT: 39.9 % (ref 39.0–52.0)
Hemoglobin: 13.7 g/dL (ref 13.0–17.0)
Immature Granulocytes: 1 %
Lymphocytes Relative: 20 %
Lymphs Abs: 1.9 10*3/uL (ref 0.7–4.0)
MCH: 31.5 pg (ref 26.0–34.0)
MCHC: 34.3 g/dL (ref 30.0–36.0)
MCV: 91.7 fL (ref 80.0–100.0)
Monocytes Absolute: 0.6 10*3/uL (ref 0.1–1.0)
Monocytes Relative: 6 %
Neutro Abs: 6.7 10*3/uL (ref 1.7–7.7)
Neutrophils Relative %: 67 %
Platelets: 237 10*3/uL (ref 150–400)
RBC: 4.35 MIL/uL (ref 4.22–5.81)
RDW: 12.1 % (ref 11.5–15.5)
WBC: 9.8 10*3/uL (ref 4.0–10.5)
nRBC: 0 % (ref 0.0–0.2)

## 2020-12-06 LAB — SARS CORONAVIRUS 2 (TAT 6-24 HRS): SARS Coronavirus 2: NEGATIVE

## 2020-12-06 IMAGING — MR MR HEAD W/O CM
4 of 5 series · 21 of 48 positions shown · non-contrast
Comparison: Prior head CT examination is [DATE] and earlier.

CLINICAL DATA: Neuro deficit, acute, stroke suspected; right upper
extremity weakness postop.

EXAM:
MRI HEAD WITHOUT CONTRAST
TECHNIQUE: Multiplanar, multiecho pulse sequences of the brain and surrounding
structures were obtained without intravenous contrast.

[Series 12: T2 · sagittal · 3.0mm · 0.43mm/px · 8 of 15 slices shown (1 of 2)]
[im 1/15]
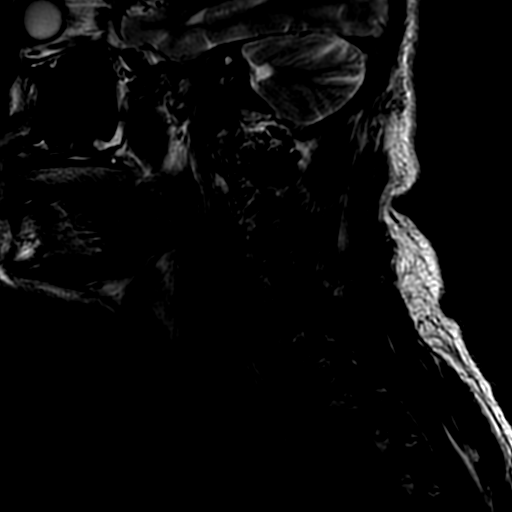
[im 3/15]
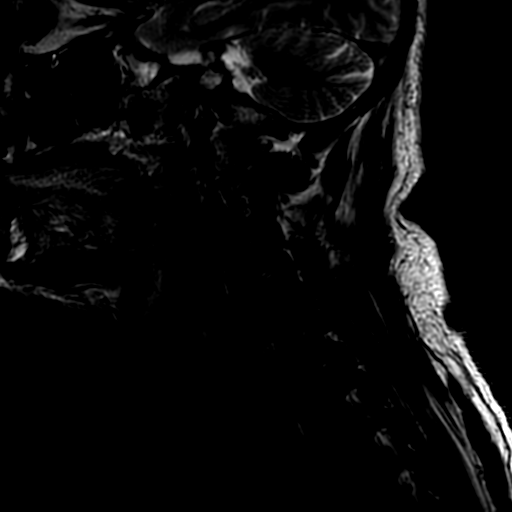
[im 5/15]
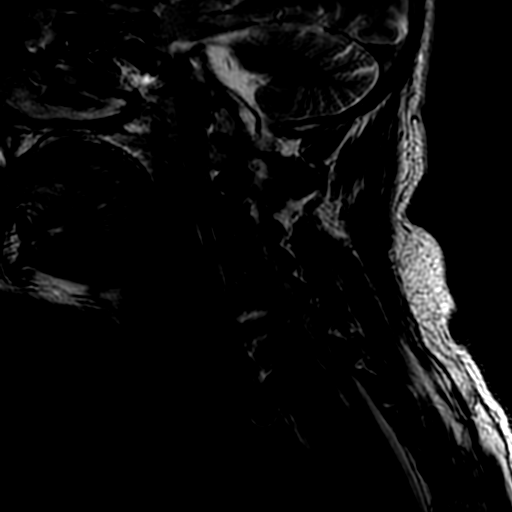
[im 7/15]
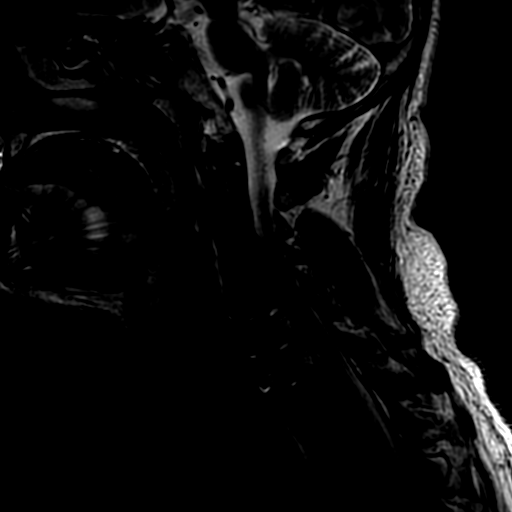
[im 9/15]
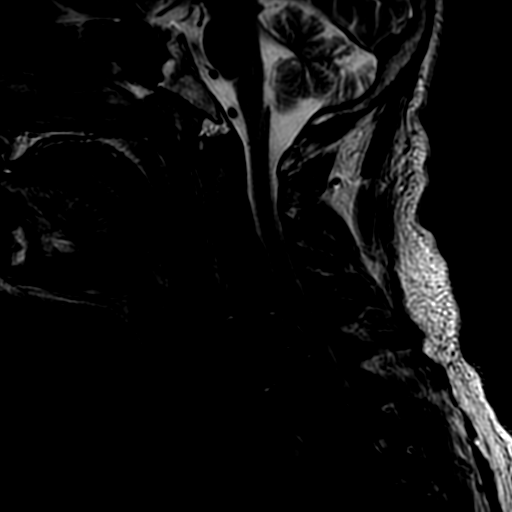
[im 11/15]
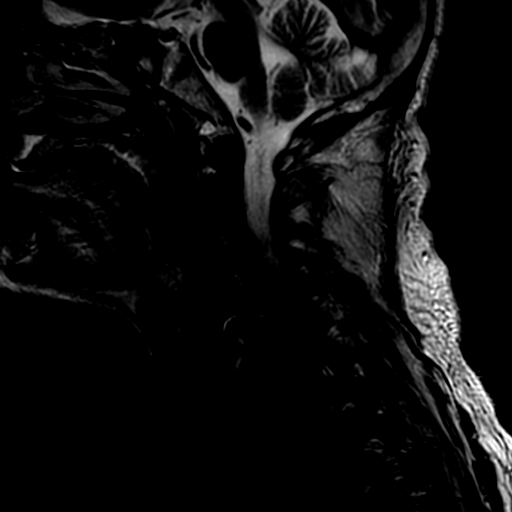
[im 13/15]
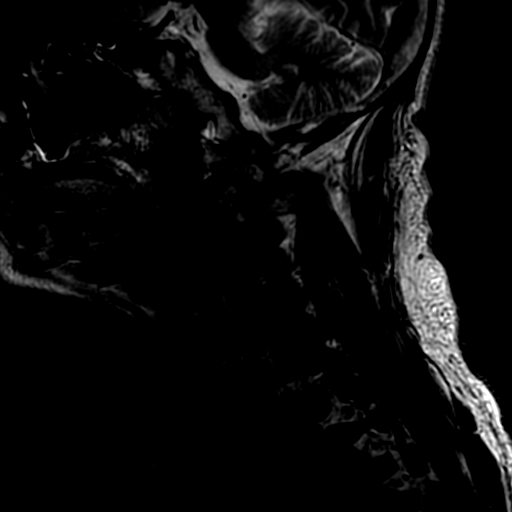
[im 15/15]
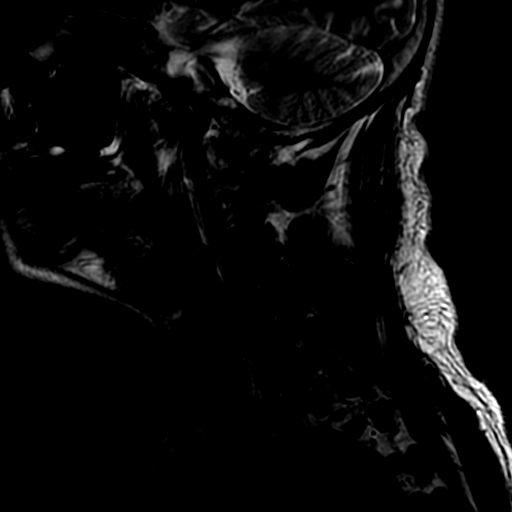

[Series 14: T1 · sagittal · 3.0mm · 0.43mm/px · 3 of 15 slices shown (1 of 2)]
[im 3/15]
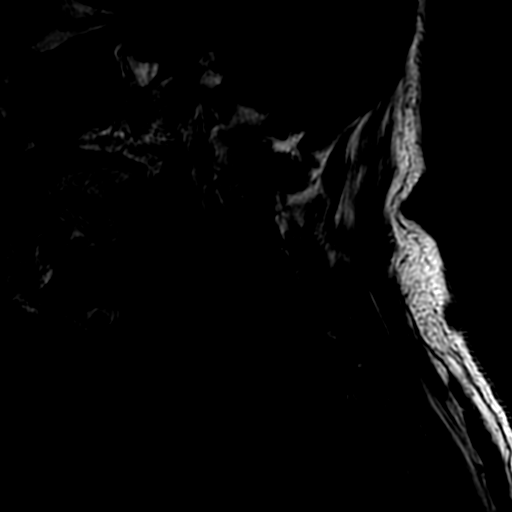
[im 9/15]
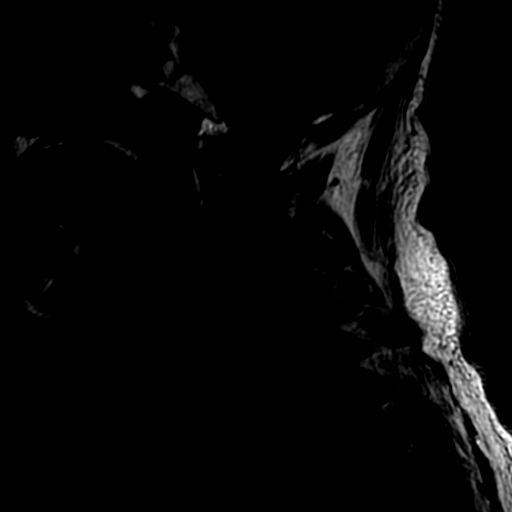
[im 13/15]
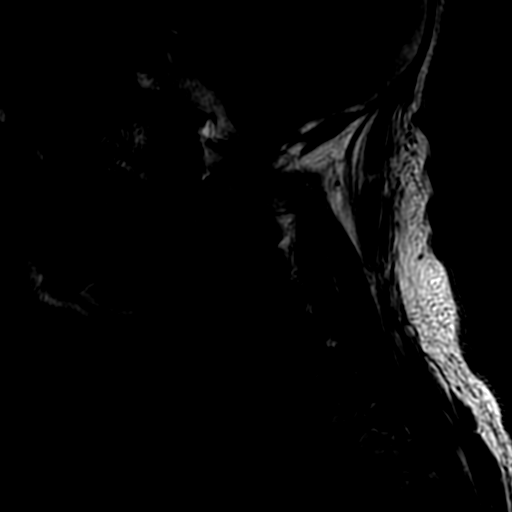

[Series 15: T2 · axial · 3.0mm · 0.35mm/px · z∈[-224,-155]mm · 7 of 24 slices shown (2 of 2)]
[im 1/24]
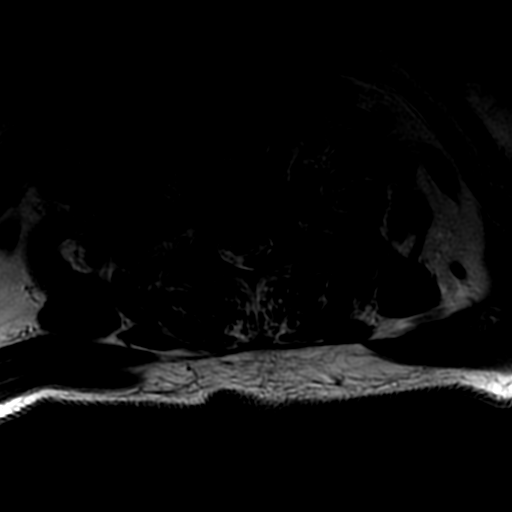
[im 5/24]
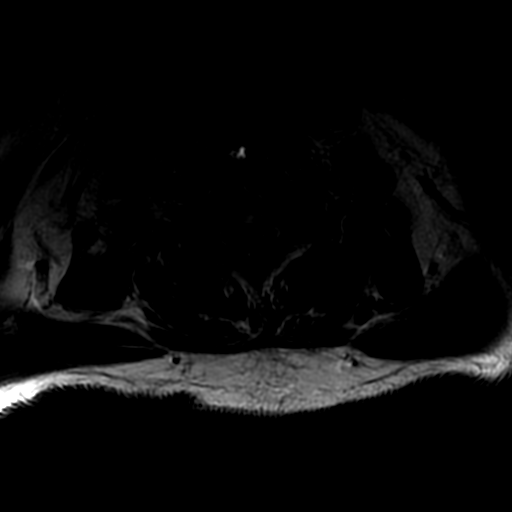
[im 7/24]
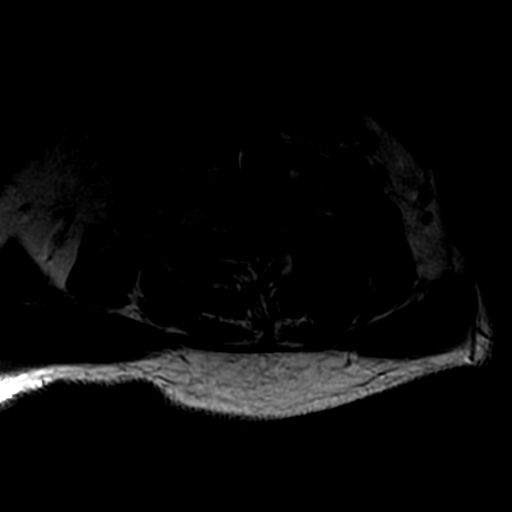
[im 11/24]
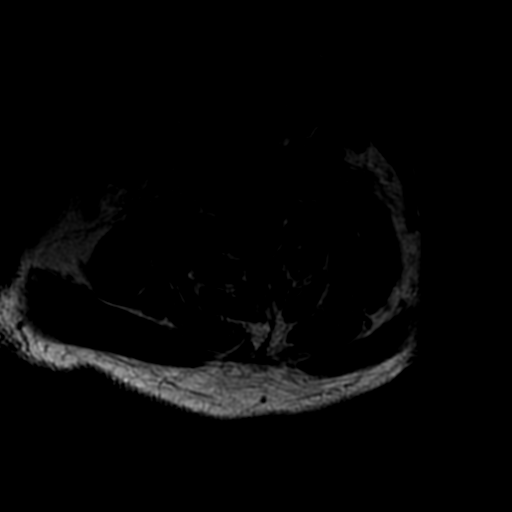
[im 13/24]
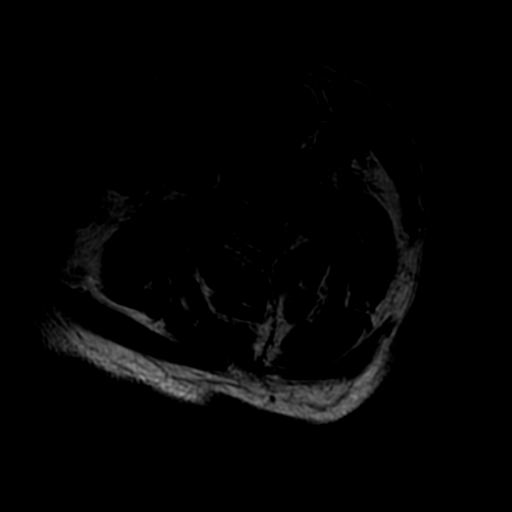
[im 17/24]
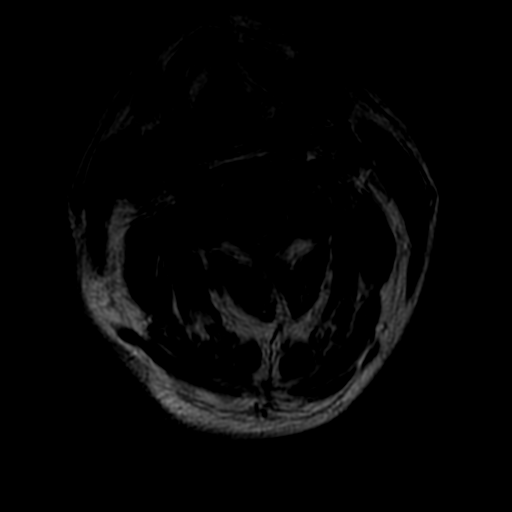
[im 21/24]
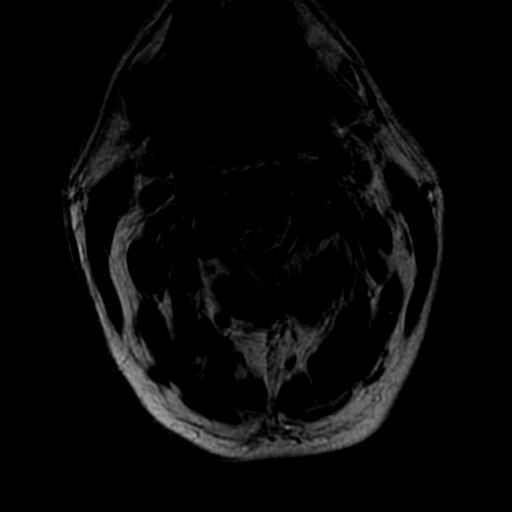

[Series 16: T1 · axial · 3.0mm · 0.70mm/px · z∈[-210,-154]mm · 3 of 24 slices shown (2 of 2)]
[im 5/24]
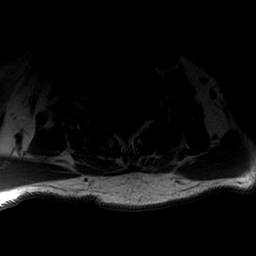
[im 13/24]
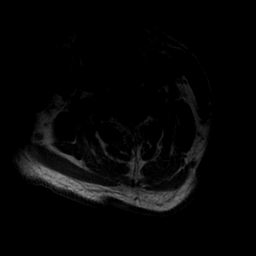
[im 21/24]
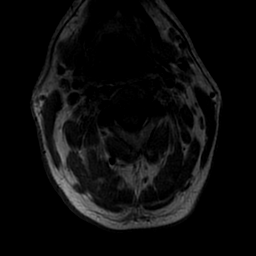

[21 of 48 positions shown; findings below may reference images not displayed]

FINDINGS: Brain:

Mild intermittent motion degradation.

Cerebral volume is normal for age.

There are a few scattered punctate foci of T2/FLAIR hyperintensity
within the cerebral white matter, nonspecific but likely reflecting
minimal changes of chronic small vessel ischemia.

Focus of SWI signal loss within the anterior right frontal lobe
compatible with chronic microhemorrhage (series 7, image 70).

There is no acute infarct.

No evidence of intracranial mass.

No extra-axial fluid collection.

No midline shift.

Vascular: Expected proximal arterial flow voids.

Skull and upper cervical spine: No focal marrow lesion.

Sinuses/Orbits: Visualized orbits show no acute finding. Trace
bilateral ethmoid sinus mucosal thickening.
IMPRESSION: Mildly motion degraded exam.

No evidence of acute intracranial abnormality, including acute
infarction.

Minimal chronic small-vessel ischemic changes within the cerebral
white matter.

Chronic microhemorrhage within the anterior right frontal lobe.

## 2020-12-06 IMAGING — MR MR HEAD W/O CM
7 of 11 series · 25 of 48 positions shown · non-contrast
Comparison: Prior head CT examination is [DATE] and earlier.

CLINICAL DATA: Neuro deficit, acute, stroke suspected; right upper
extremity weakness postop.

EXAM:
MRI HEAD WITHOUT CONTRAST
TECHNIQUE: Multiplanar, multiecho pulse sequences of the brain and surrounding
structures were obtained without intravenous contrast.

[Series 2: DWI · axial · 3.0mm · 0.94mm/px · z∈[-69,+65]mm · 7 of 89 slices shown (1 of 2)]
[im 1/89]
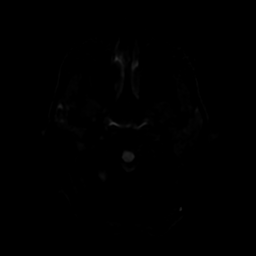
[im 15/89]
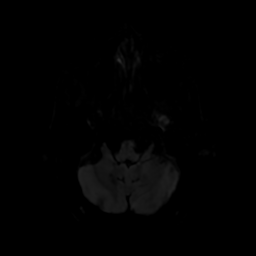
[im 30/89]
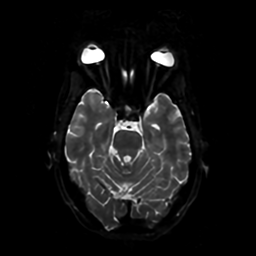
[im 45/89]
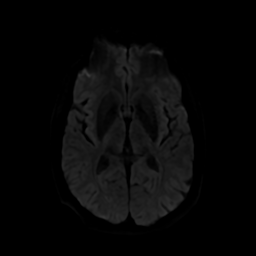
[im 59/89]
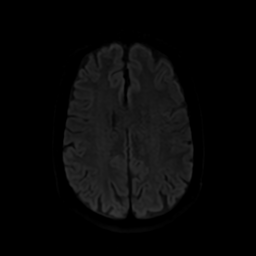
[im 74/89]
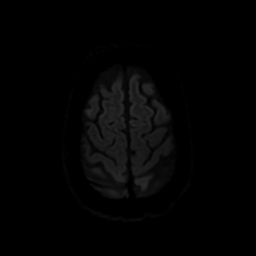
[im 89/89]
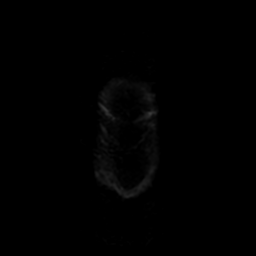

[Series 3: DWI · coronal · 4.0mm · 0.94mm/px · 6 of 72 slices shown (2 of 2)]
[im 1/72]
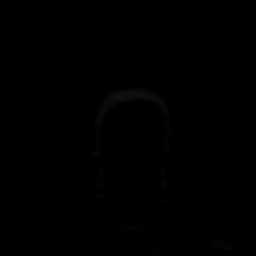
[im 15/72]
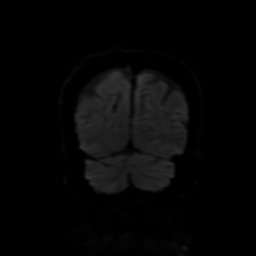
[im 29/72]
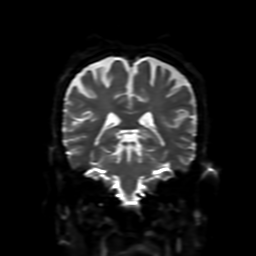
[im 43/72]
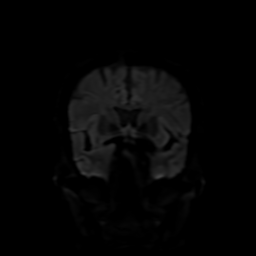
[im 57/72]
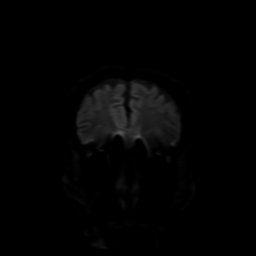
[im 72/72]
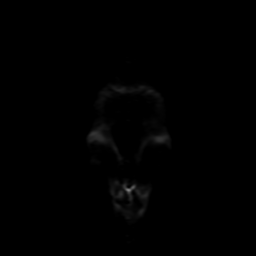

[Series 4: FLAIR · sagittal · 5.0mm · 0.23mm/px · 2 of 23 slices shown (1 of 2)]
[im 1/23]
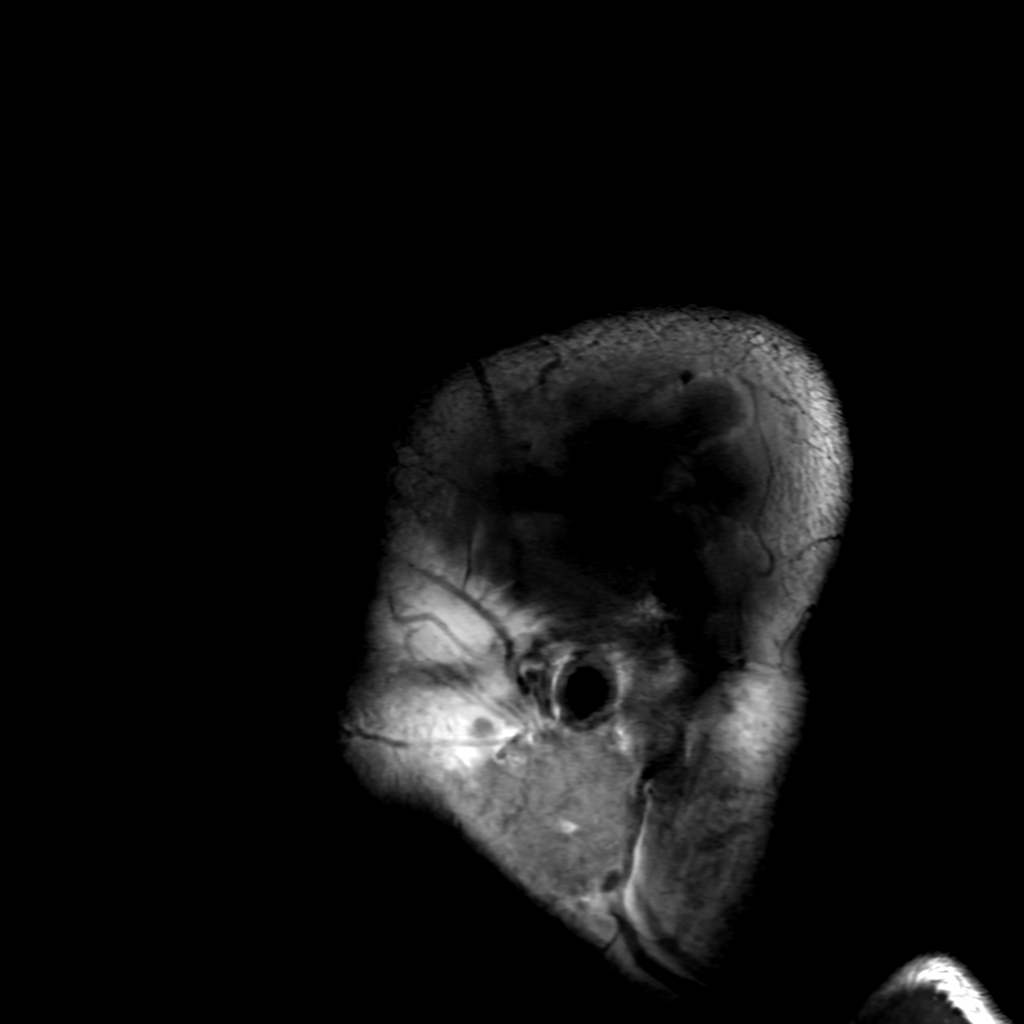
[im 23/23]
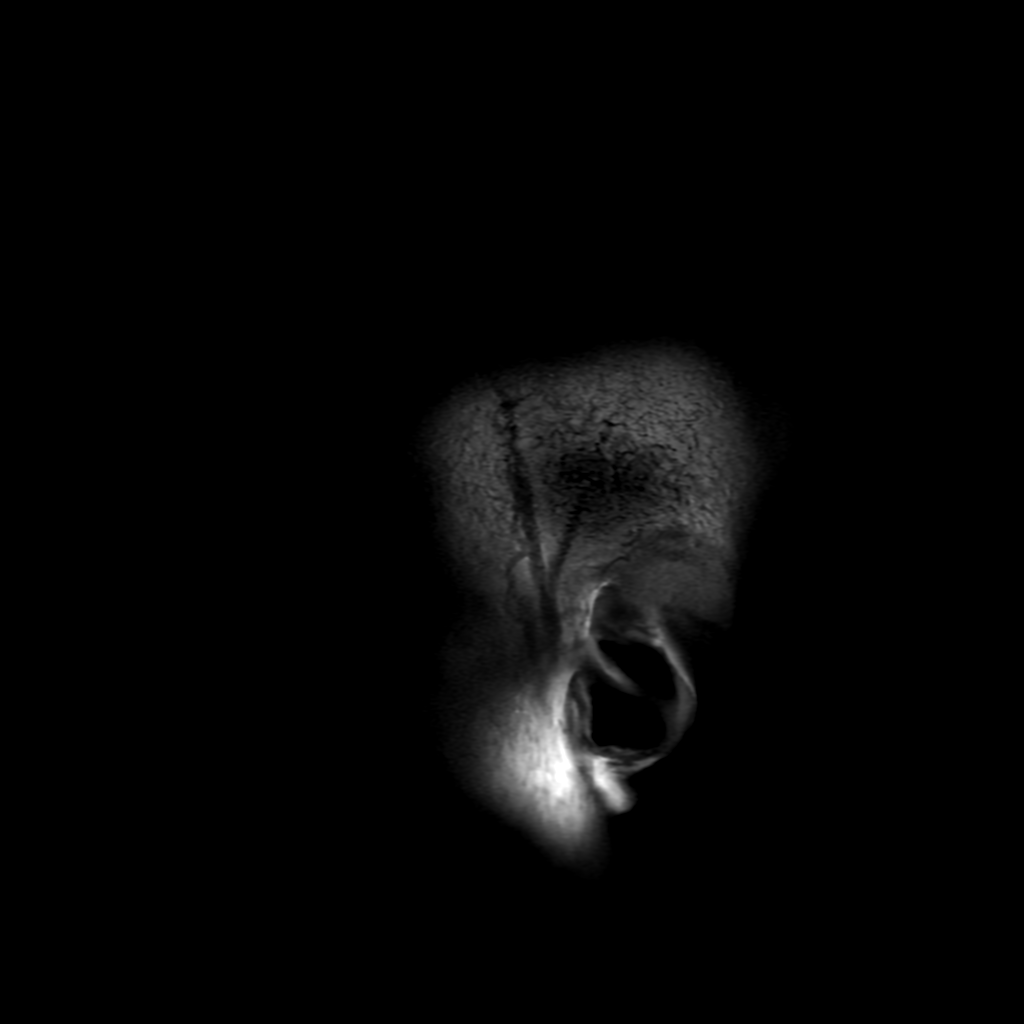

[Series 5: T2 · axial · 5.0mm · 0.23mm/px · 1 of 24 slices shown]
[im 1/24]
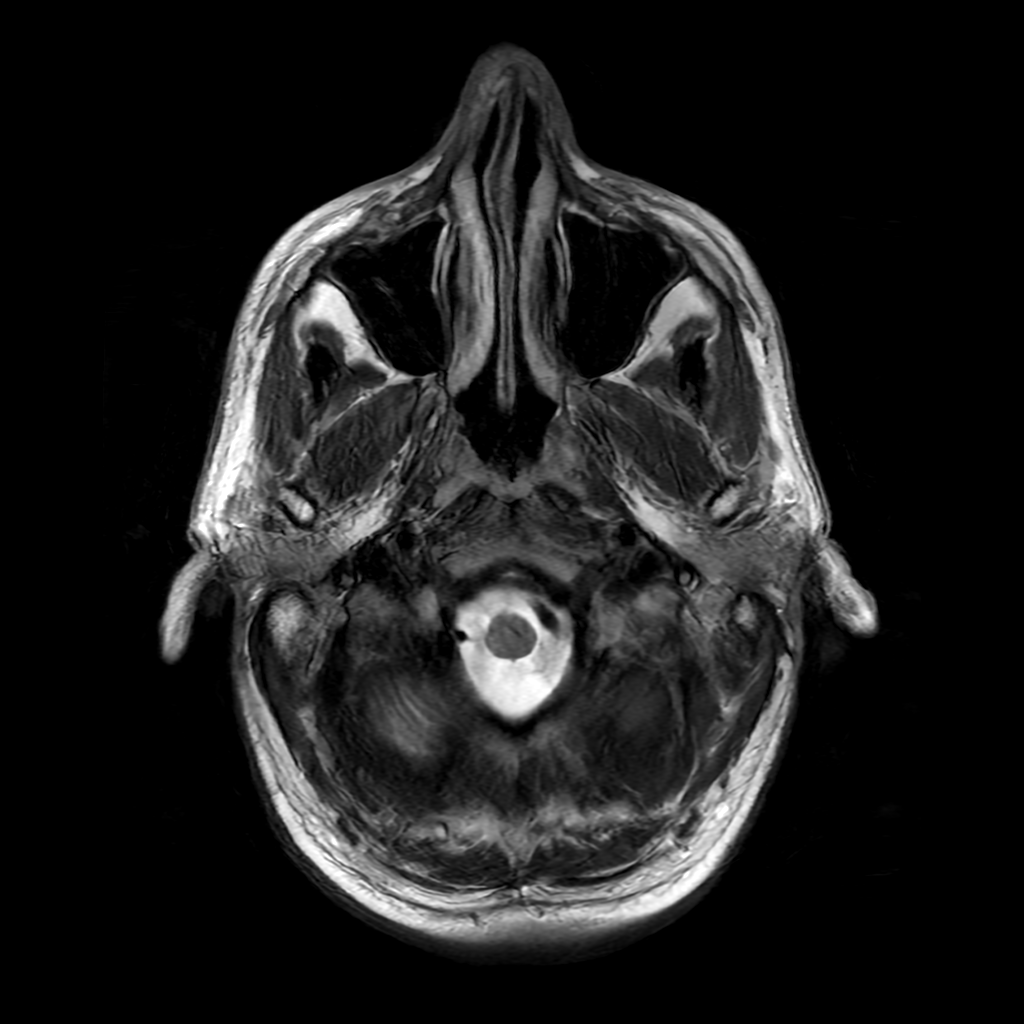

[Series 6: FLAIR · axial · 3.0mm · 0.45mm/px · z∈[-61,+71]mm · 2 of 23 slices shown (2 of 2)]
[im 1/23]
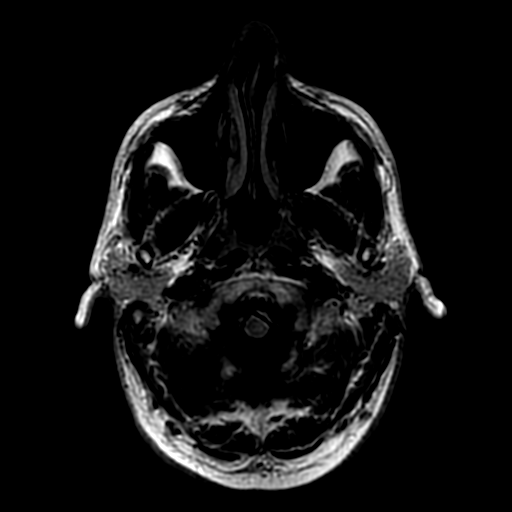
[im 23/23]
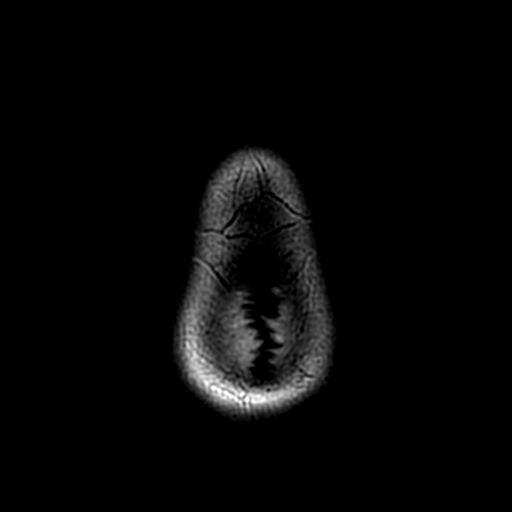

[Series 250: ADC · axial · 3.0mm · 0.94mm/px · z∈[-69,+65]mm · 4 of 46 slices shown (1 of 2)]
[im 1/46]
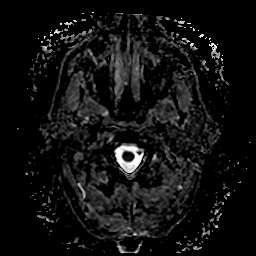
[im 16/46]
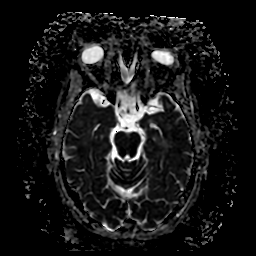
[im 31/46]
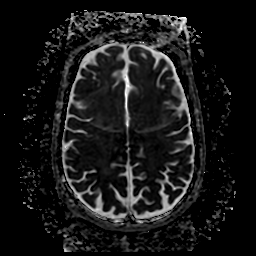
[im 46/46]
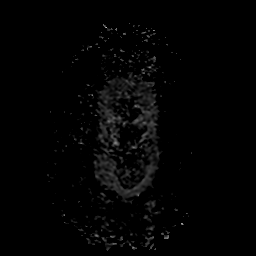

[Series 350: ADC · coronal · 4.0mm · 0.94mm/px · 3 of 33 slices shown (2 of 2)]
[im 1/33]
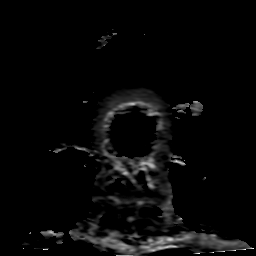
[im 17/33]
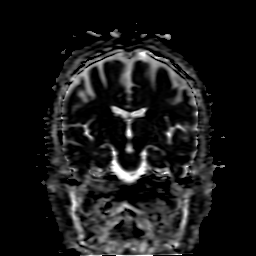
[im 33/33]
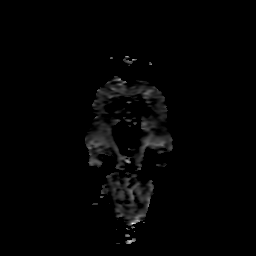

[25 of 48 positions shown; findings below may reference images not displayed]

FINDINGS: Brain:

Mild intermittent motion degradation.

Cerebral volume is normal for age.

There are a few scattered punctate foci of T2/FLAIR hyperintensity
within the cerebral white matter, nonspecific but likely reflecting
minimal changes of chronic small vessel ischemia.

Focus of SWI signal loss within the anterior right frontal lobe
compatible with chronic microhemorrhage (series 7, image 70).

There is no acute infarct.

No evidence of intracranial mass.

No extra-axial fluid collection.

No midline shift.

Vascular: Expected proximal arterial flow voids.

Skull and upper cervical spine: No focal marrow lesion.

Sinuses/Orbits: Visualized orbits show no acute finding. Trace
bilateral ethmoid sinus mucosal thickening.
IMPRESSION: Mildly motion degraded exam.

No evidence of acute intracranial abnormality, including acute
infarction.

Minimal chronic small-vessel ischemic changes within the cerebral
white matter.

Chronic microhemorrhage within the anterior right frontal lobe.

## 2020-12-06 IMAGING — CT CT CERVICAL SPINE W/O CM
3 of 4 series · 12 of 33 positions shown, 14 images · non-contrast
Comparison: [DATE]

CLINICAL DATA: Right shoulder pain and right upper extremity
weakness.

EXAM:
CT CERVICAL SPINE WITHOUT CONTRAST
TECHNIQUE: Multidetector CT imaging of the cervical spine was performed without
intravenous contrast. Multiplanar CT image reconstructions were also
generated.

[Series 5: sagittal bone · sagittal · 0.23mm/px · 5 of 61 slices shown, 6 images]
[im 21/61  bone]
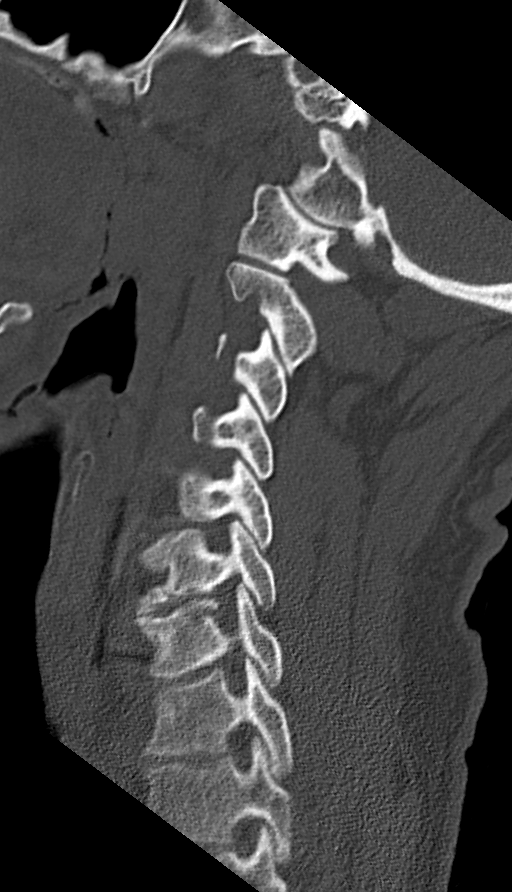
[im 26/61  bone]
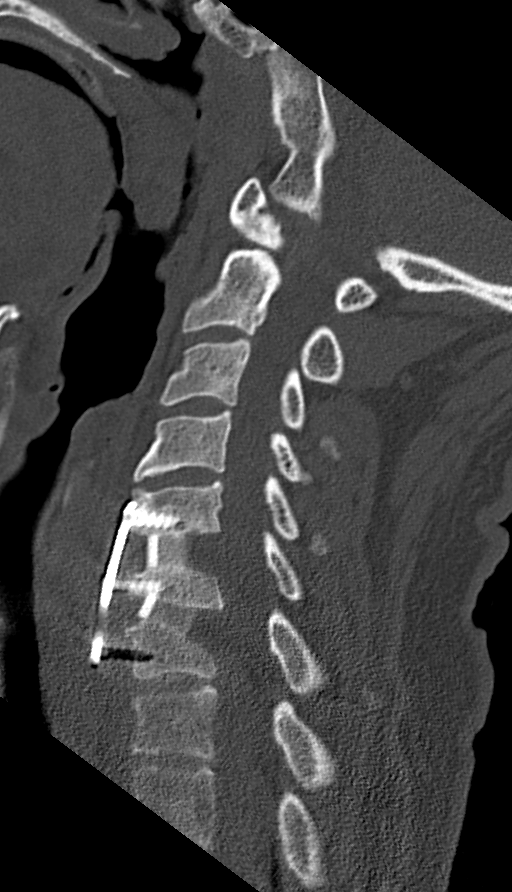
[im 31/61  soft-tissue]
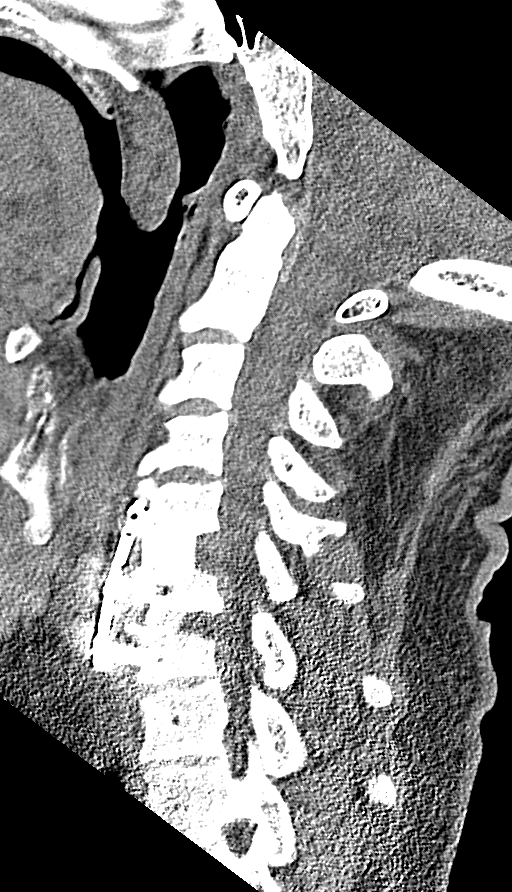
[im 31/61  bone]
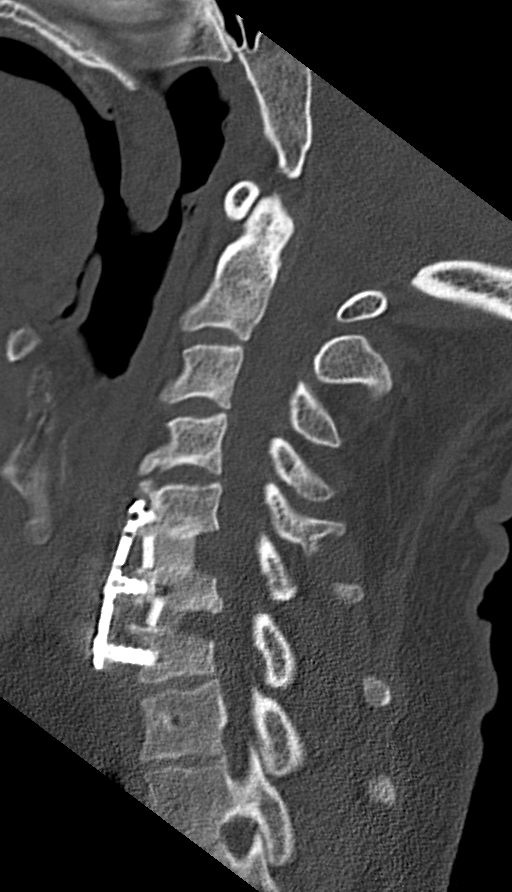
[im 36/61  bone]
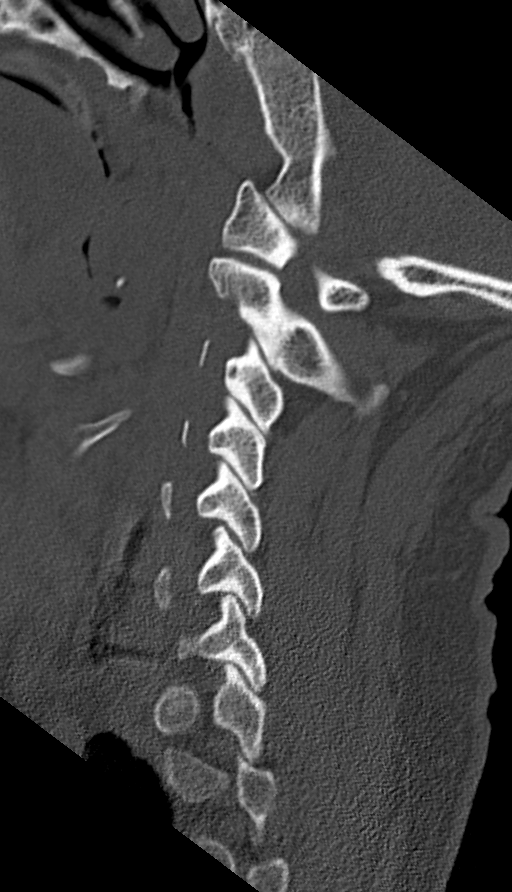
[im 41/61  bone]
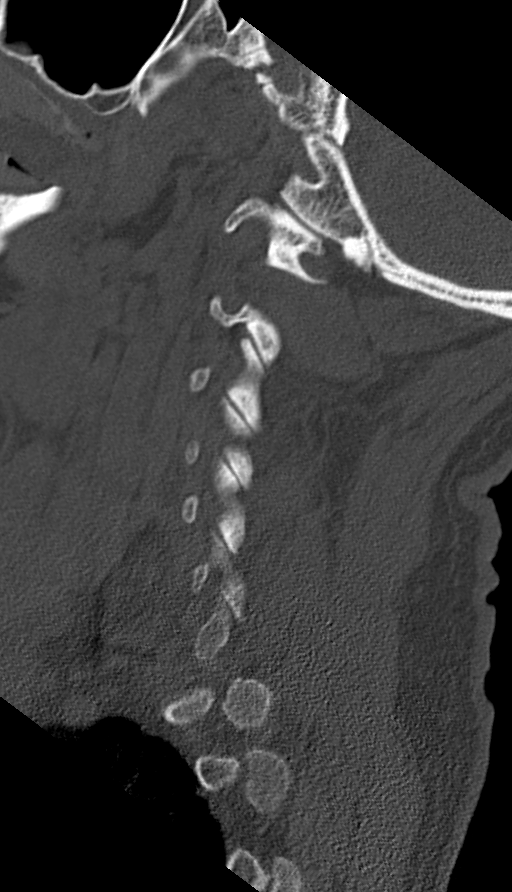

[Series 6: coronal bone · coronal · 0.23mm/px · 3 of 61 slices shown]
[im 17/61  bone]
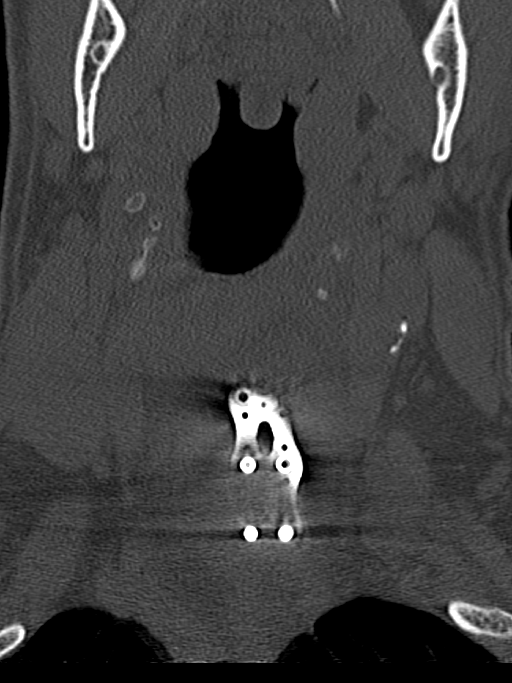
[im 26/61  bone]
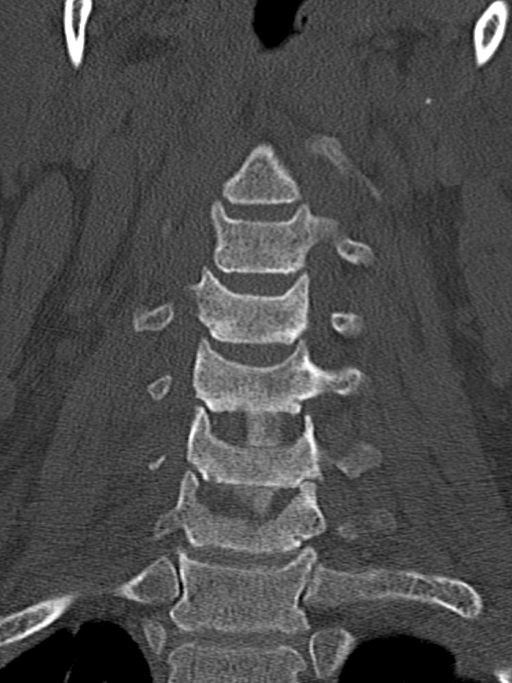
[im 35/61  bone]
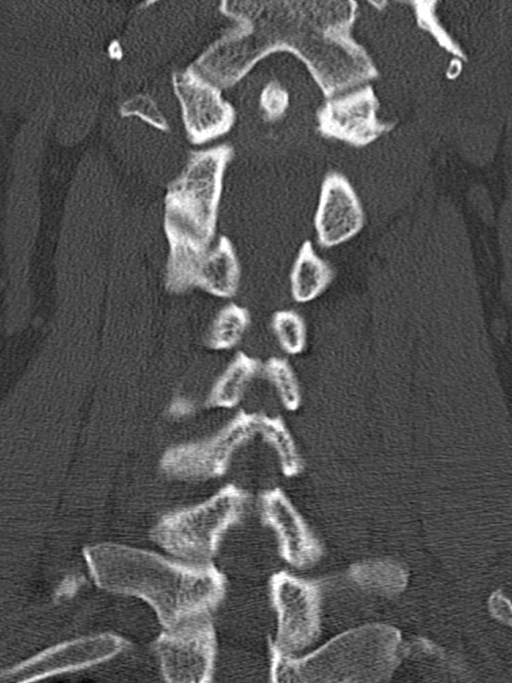

[Series 7: orthogonal axials · axial · 0.21mm/px · z∈[-60,+36]mm · 4 of 87 slices shown, 5 images]
[im 15/87  soft-tissue]
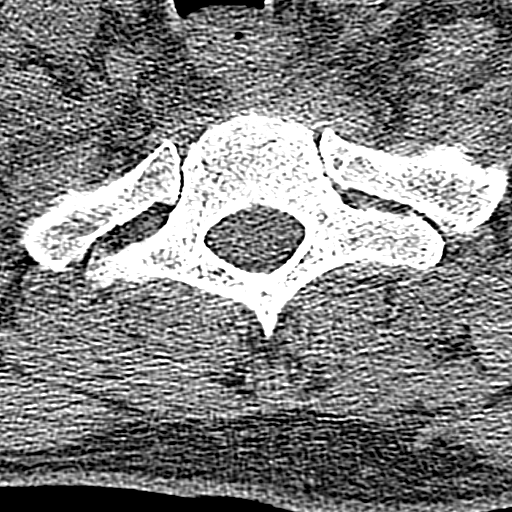
[im 15/87  bone]
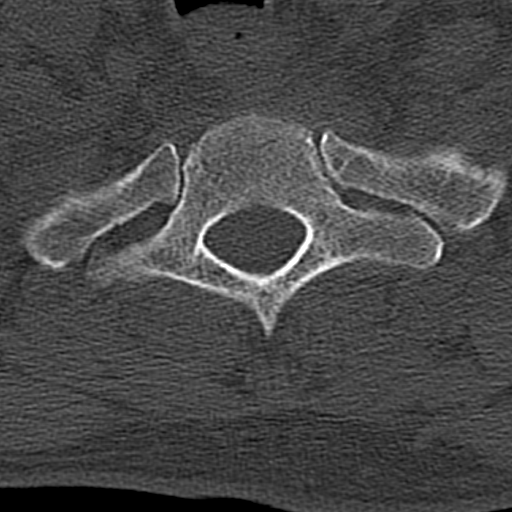
[im 29/87  bone]
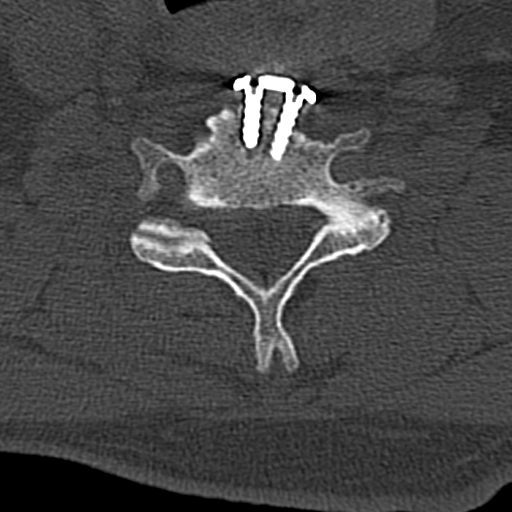
[im 58/87  bone]
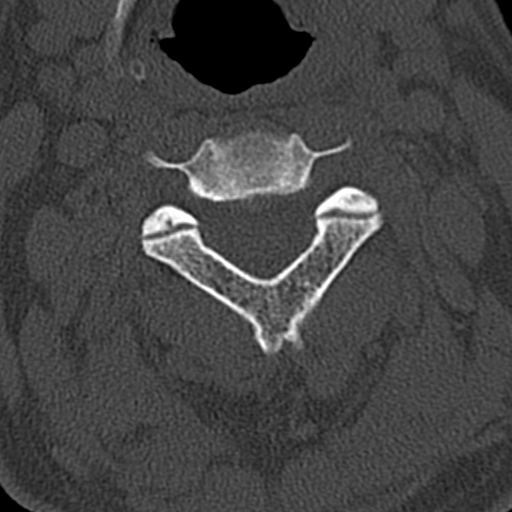
[im 72/87  bone]
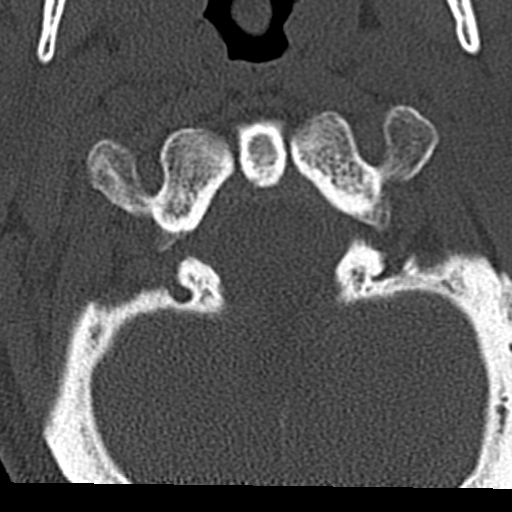

[12 of 33 positions shown; findings below may reference images not displayed]

FINDINGS: Alignment: Normal.

Skull base and vertebrae: No acute fracture. A metallic density
fusion plate and screws are seen along the anterior aspect of the
C5, C6 and C7 vertebral bodies. This represents a new finding when
compared to the prior study.

Soft tissues and spinal canal: No prevertebral fluid or swelling. No
visible canal hematoma.

Disc levels: Mild to moderate severity anterior osteophyte formation
is seen at the level of C4-C5.

Operative material is seen within the C5-C6 and C6-C7 intervertebral
disc spaces. Mild intervertebral disc space narrowing is noted at
the level of C7-T1.

Mild bilateral multilevel facet joint hypertrophy is noted.

Upper chest: Negative.

Other: None.
IMPRESSION: 1. Postoperative changes consistent with anterior cervical
discectomy and fusion at the levels of C5-C6 and C6-C7.
2. No acute cervical spine fracture.

## 2020-12-06 IMAGING — CT CT HEAD W/O CM
3 series · 15 of 46 positions shown, 18 images · non-contrast
Comparison: [DATE]

CLINICAL DATA: Right shoulder pain.

EXAM:
CT HEAD WITHOUT CONTRAST
TECHNIQUE: Contiguous axial images were obtained from the base of the skull
through the vertex without intravenous contrast.

[Series 2: head w o · axial · 0.43mm/px · z∈[+98,+218]mm · 9 of 29 slices shown, 12 images]
[im 3/29  brain]
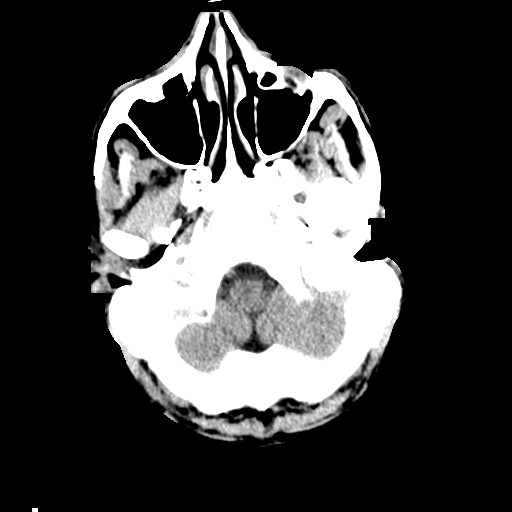
[im 3/29  bone]
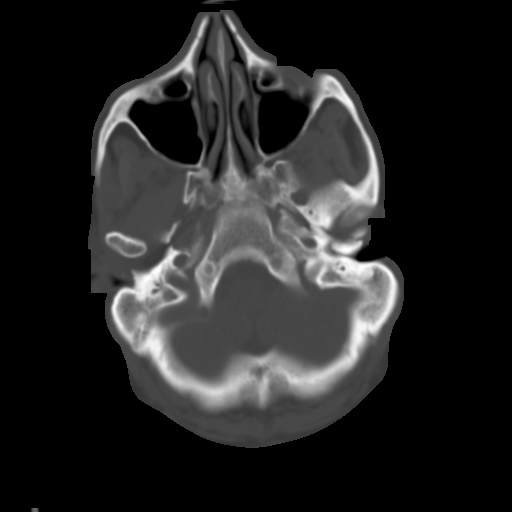
[im 6/29  brain]
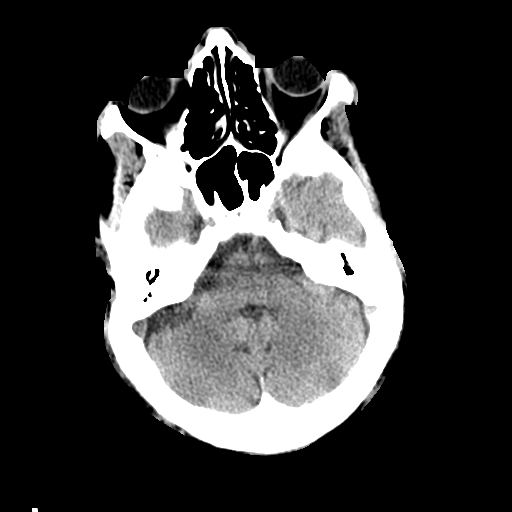
[im 9/29  brain]
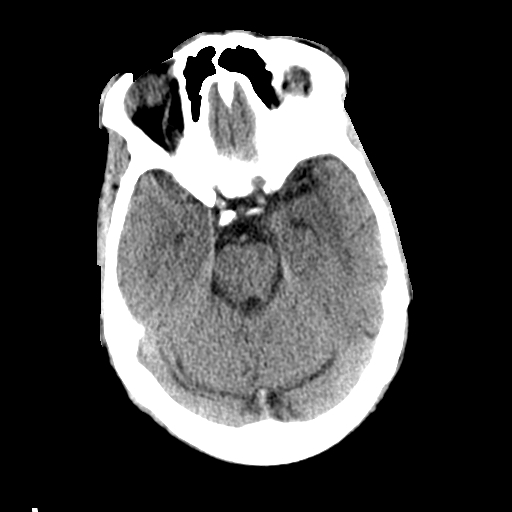
[im 12/29  brain]
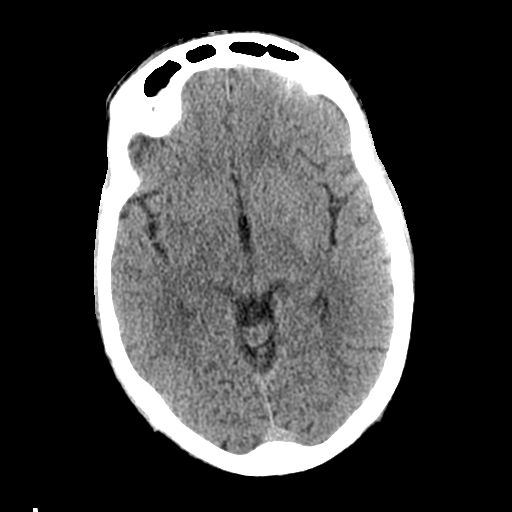
[im 15/29  brain]
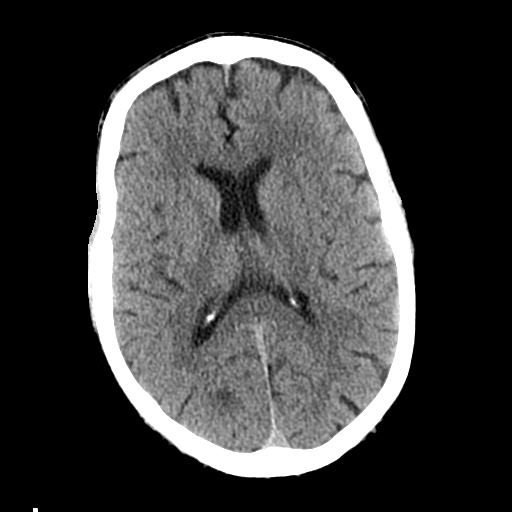
[im 15/29  bone]
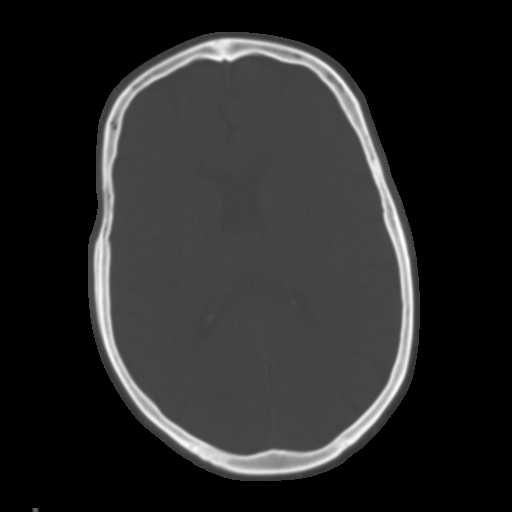
[im 18/29  brain]
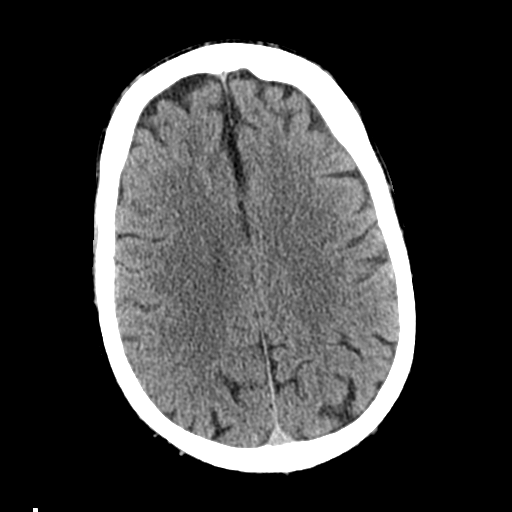
[im 21/29  brain]
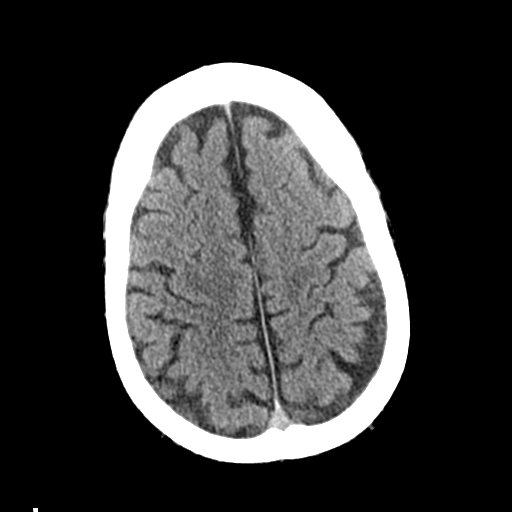
[im 24/29  brain]
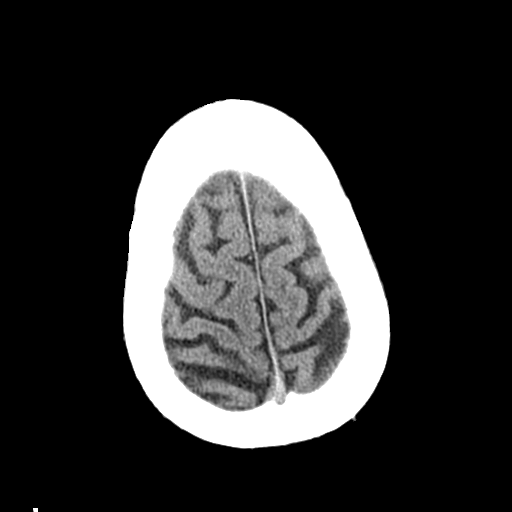
[im 27/29  brain]
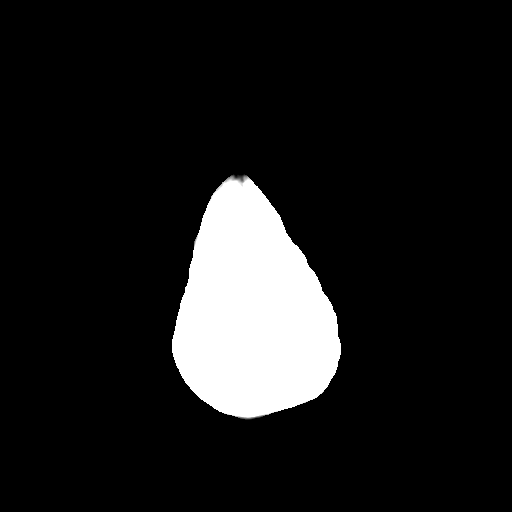
[im 27/29  bone]
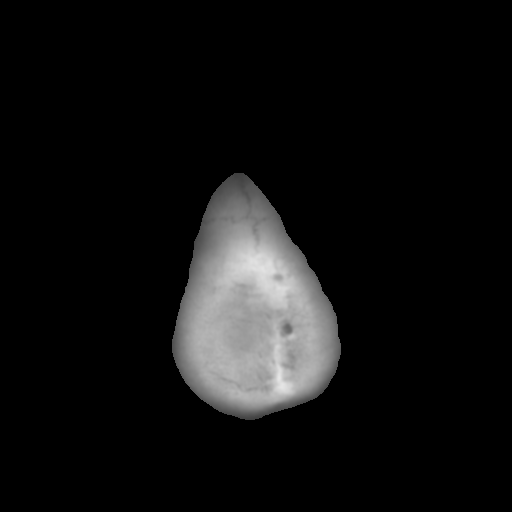

[Series 4: coronal soft · coronal · 0.28mm/px · 3 of 68 slices shown]
[im 23/68  brain]
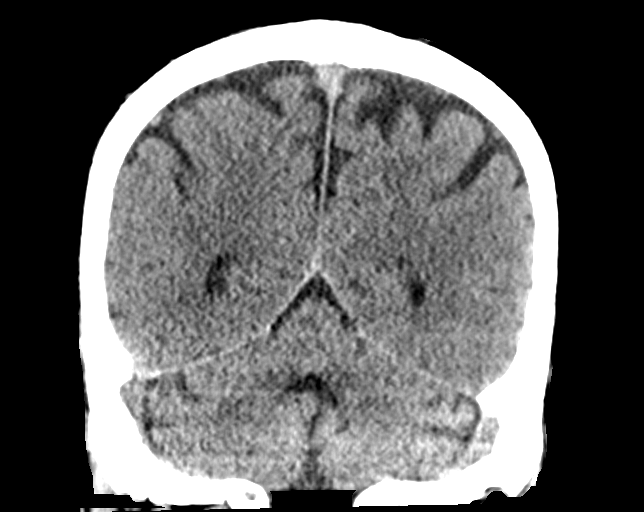
[im 30/68  brain]
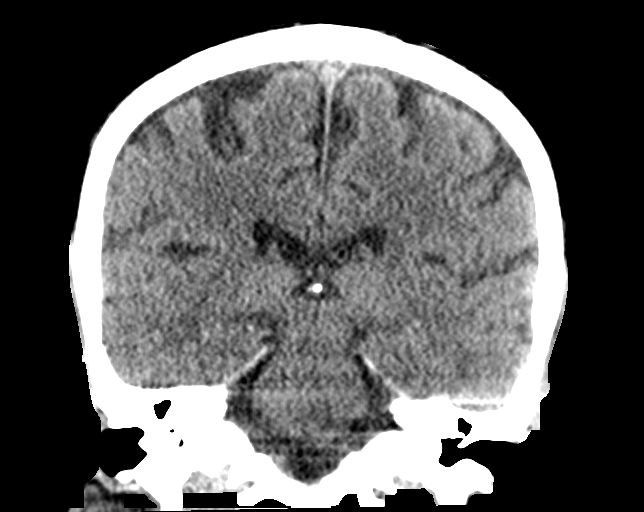
[im 38/68  brain]
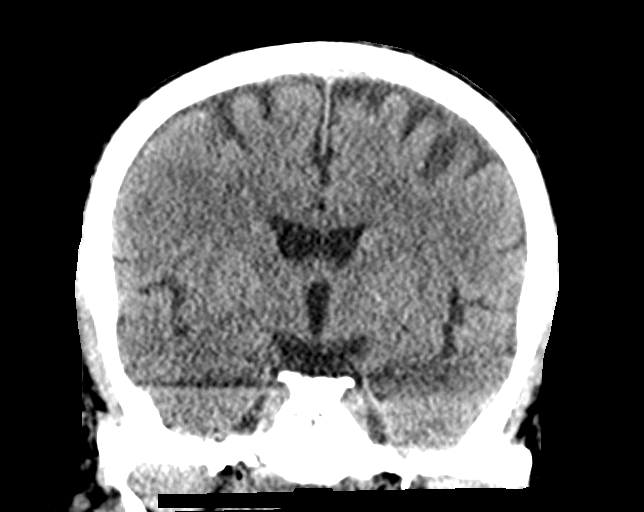

[Series 5: sagittal soft · sagittal · 0.28mm/px · 3 of 51 slices shown]
[im 17/51  brain]
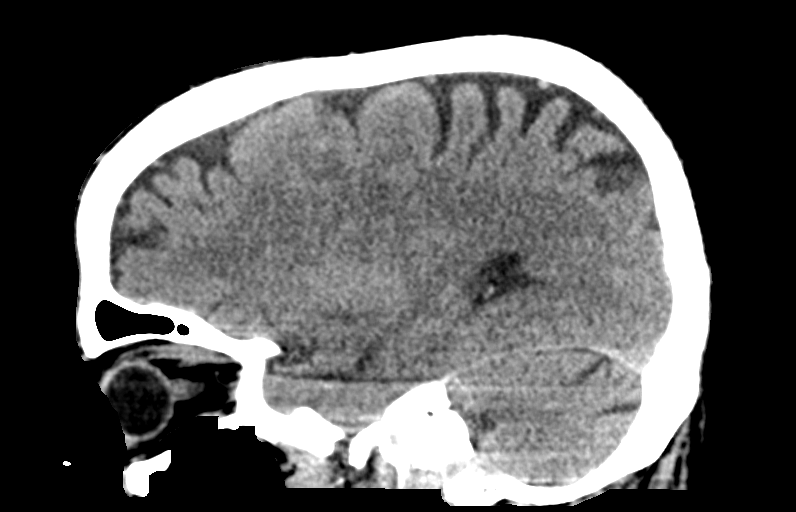
[im 26/51  brain]
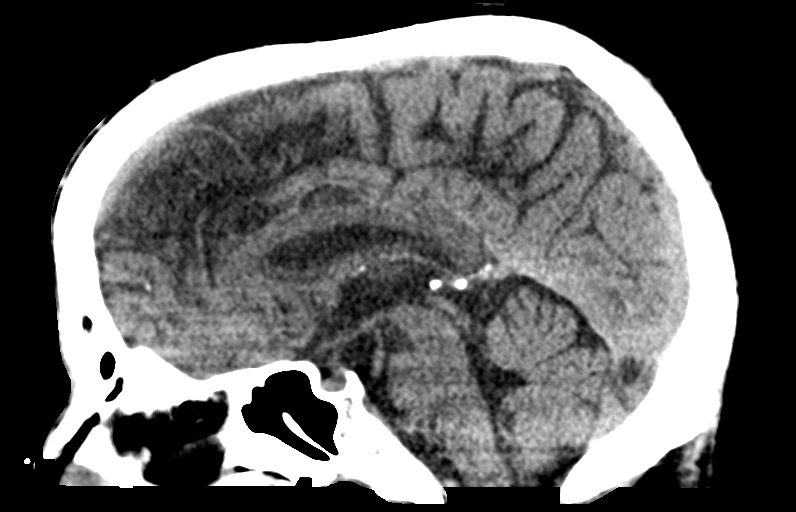
[im 34/51  brain]
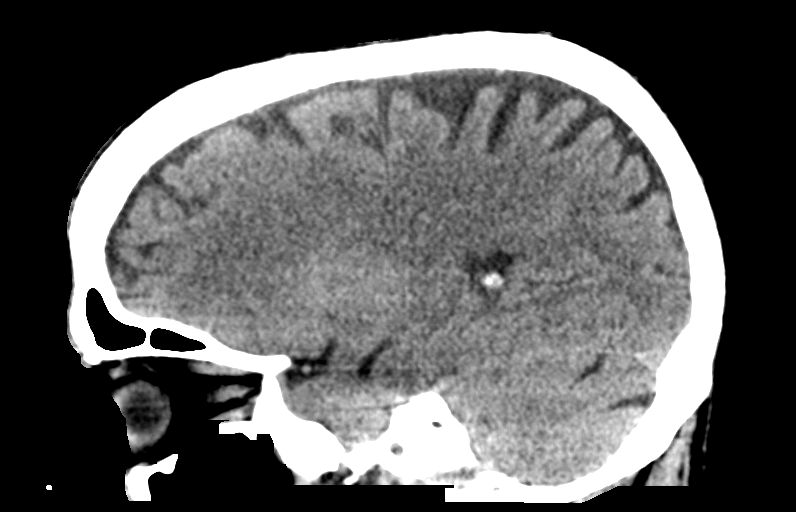

[15 of 46 positions shown; findings below may reference images not displayed]

FINDINGS: Brain: No evidence of acute infarction, hemorrhage, hydrocephalus,
extra-axial collection or mass lesion/mass effect.

Vascular: No hyperdense vessel or unexpected calcification.

Skull: Normal. Negative for fracture or focal lesion.

Sinuses/Orbits: No acute finding.

Other: None.
IMPRESSION: No acute intracranial abnormality.

## 2020-12-06 IMAGING — DX DG SHOULDER 2+V*R*
3 series · 3 of 3 positions shown · non-contrast
Comparison: None.

CLINICAL DATA: Right shoulder pain.

EXAM:
RIGHT SHOULDER - 2+ VIEW

[shoulder grashey (1 of 2)]
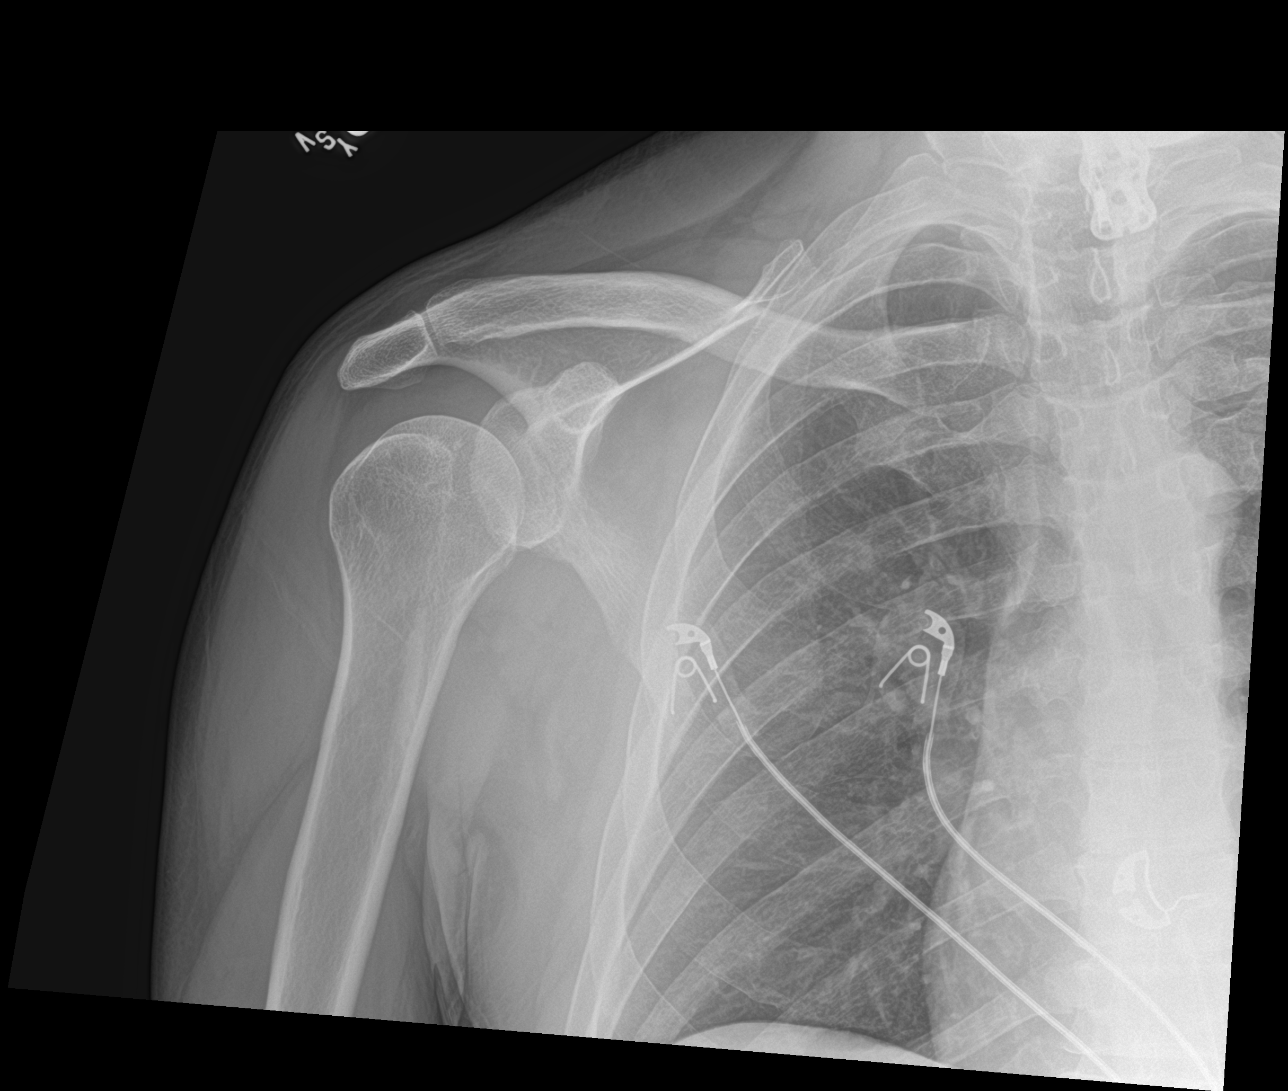

[shoulder axillary]
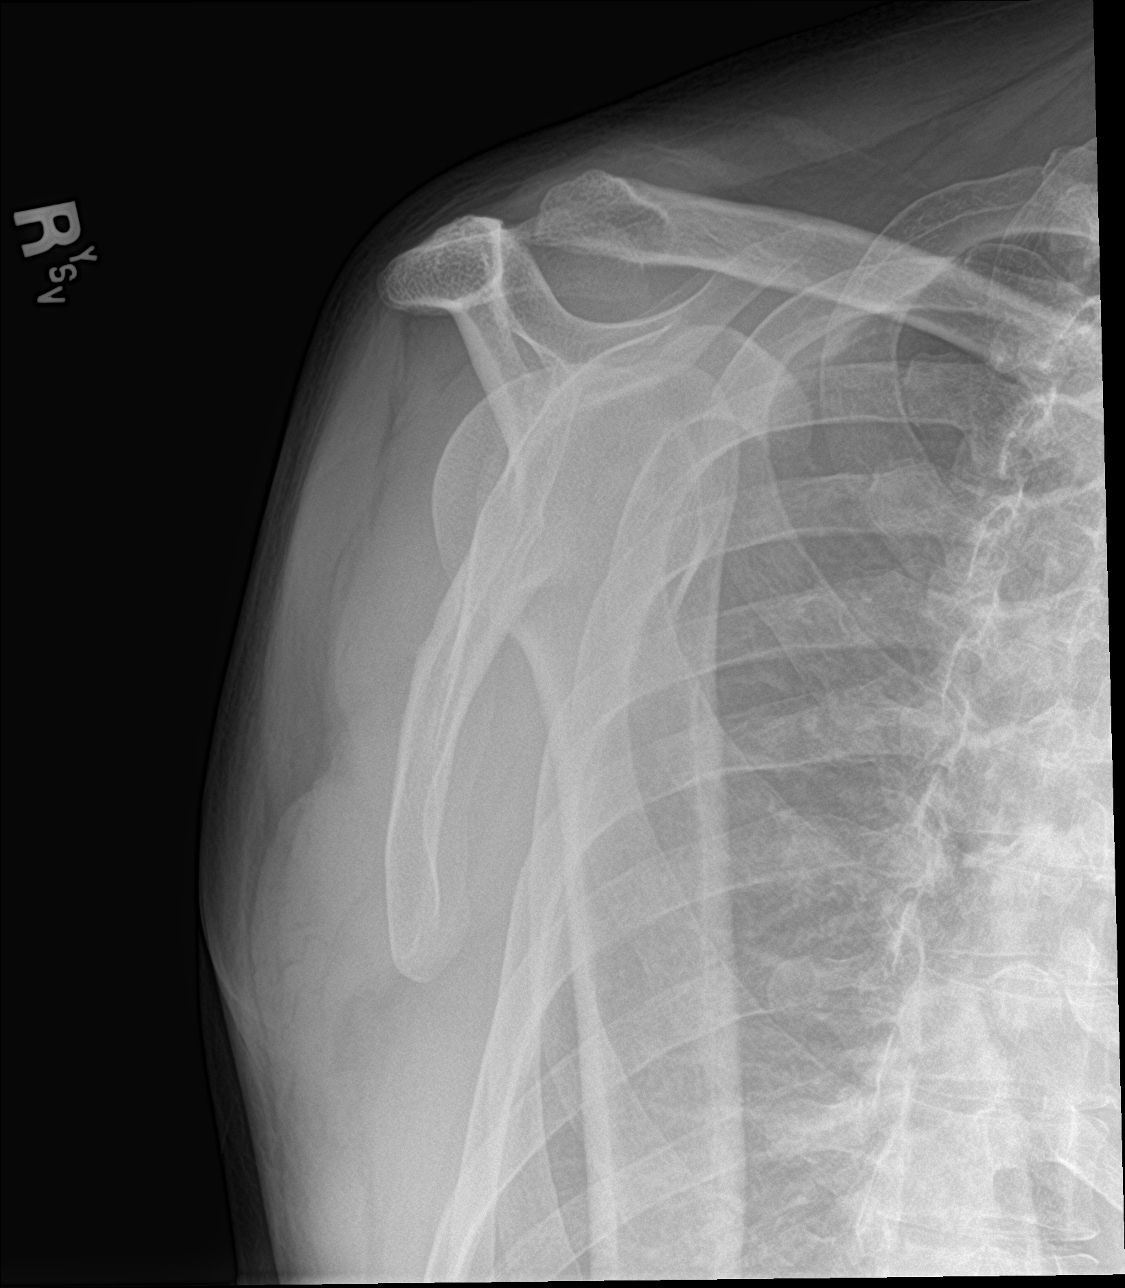

[shoulder grashey (2 of 2)]
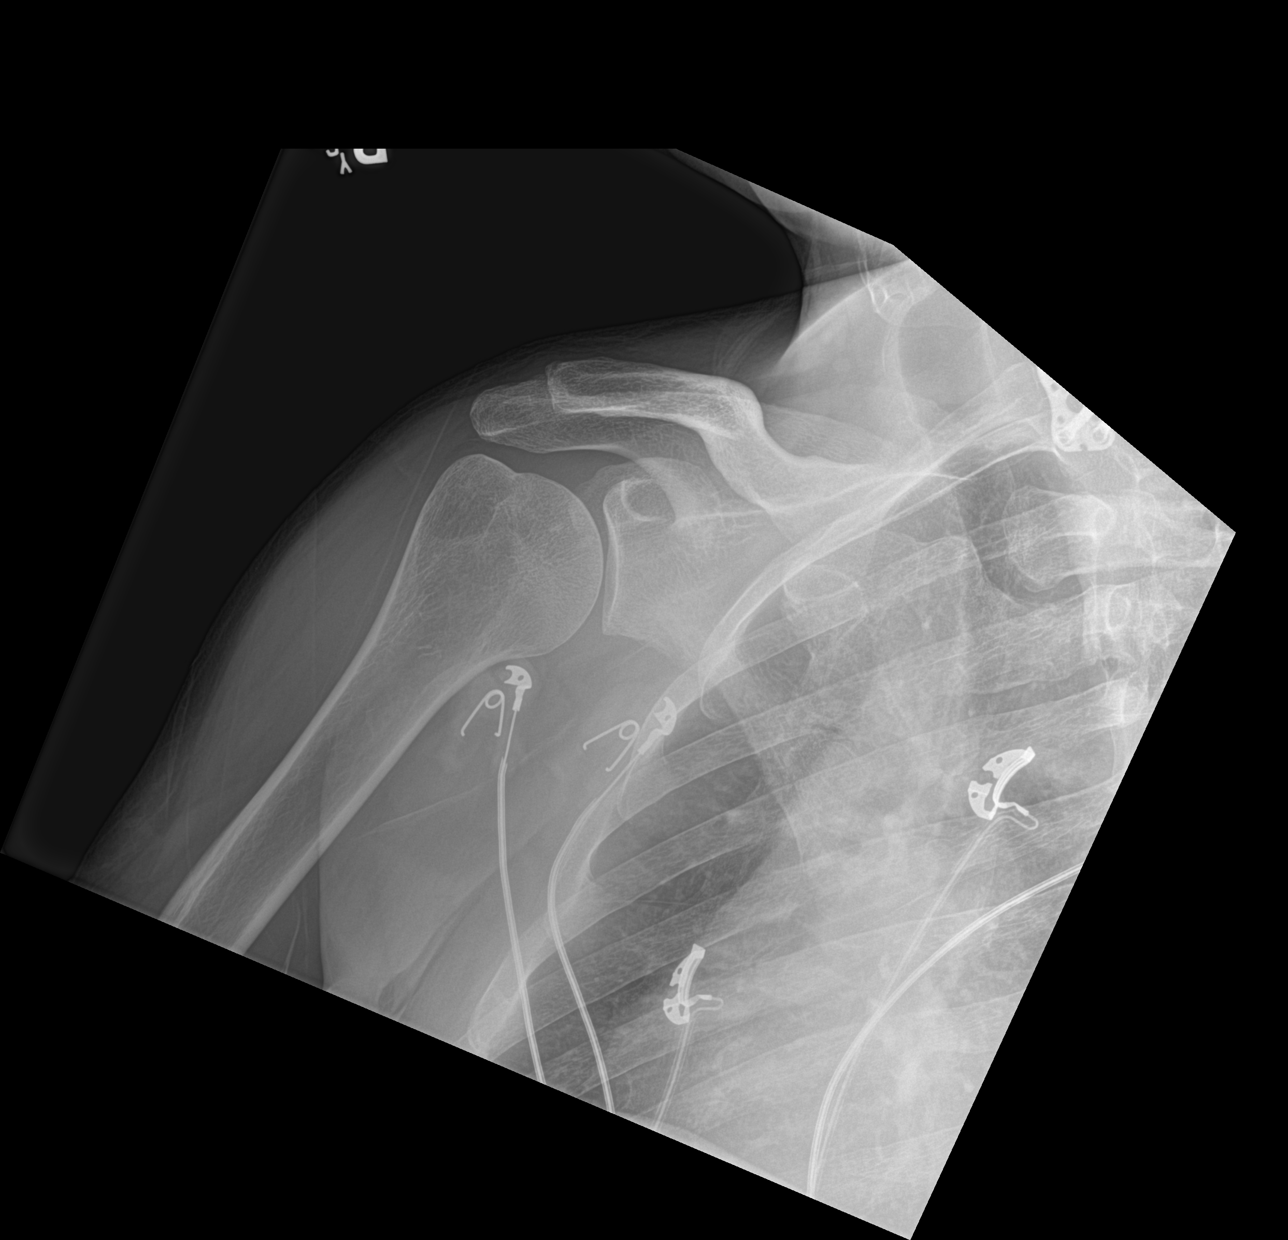

[3 of 3 positions shown; findings below may reference images not displayed]

FINDINGS: There is no evidence of fracture or dislocation. There is no
evidence of arthropathy or other focal bone abnormality. Soft
tissues are unremarkable.
IMPRESSION: Negative.

## 2020-12-06 IMAGING — MR MR CERVICAL SPINE W/O CM
9 of 16 series · 21 of 48 positions shown · non-contrast
Comparison: MRI of the cervical spine [DATE]

CLINICAL DATA: Cervical radiculopathy. Right upper extremity
weakness postoperative.

EXAM:
MRI CERVICAL SPINE WITHOUT CONTRAST
TECHNIQUE: Multiplanar, multisequence MR imaging of the cervical spine was
performed. No intravenous contrast was administered.

[Series 2: DWI · axial · 3.0mm · 0.94mm/px · z∈[-69,+65]mm · 5 of 89 slices shown (1 of 2)]
[im 1/89]
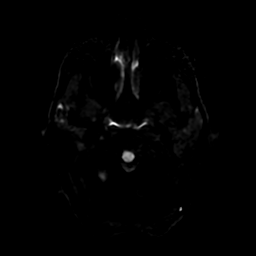
[im 23/89]
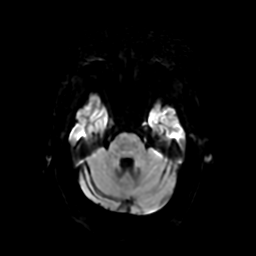
[im 45/89]
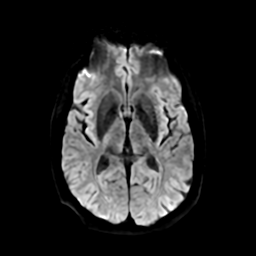
[im 67/89]
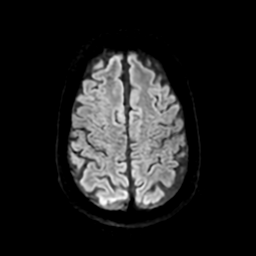
[im 89/89]
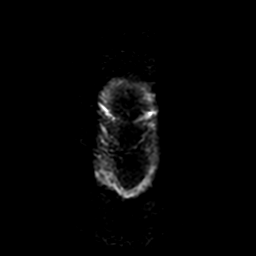

[Series 3: DWI · coronal · 4.0mm · 0.94mm/px · 5 of 72 slices shown (2 of 2)]
[im 1/72]
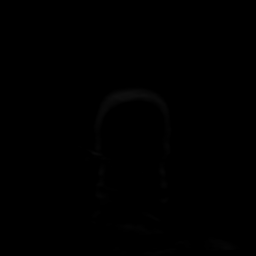
[im 18/72]
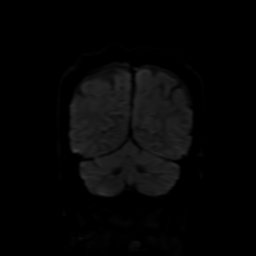
[im 36/72]
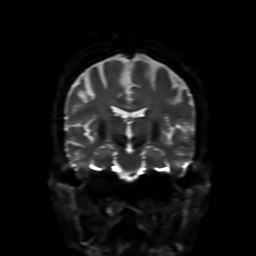
[im 54/72]
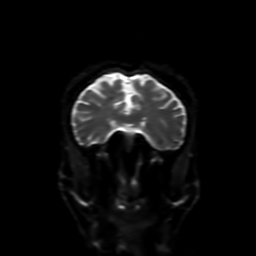
[im 72/72]
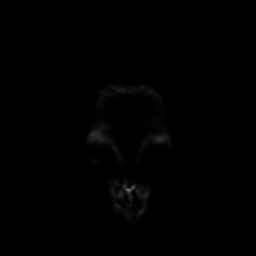

[Series 5: T2 · axial · 5.0mm · 0.23mm/px · z∈[-62,+76]mm · 2 of 24 slices shown (1 of 4)]
[im 1/24]
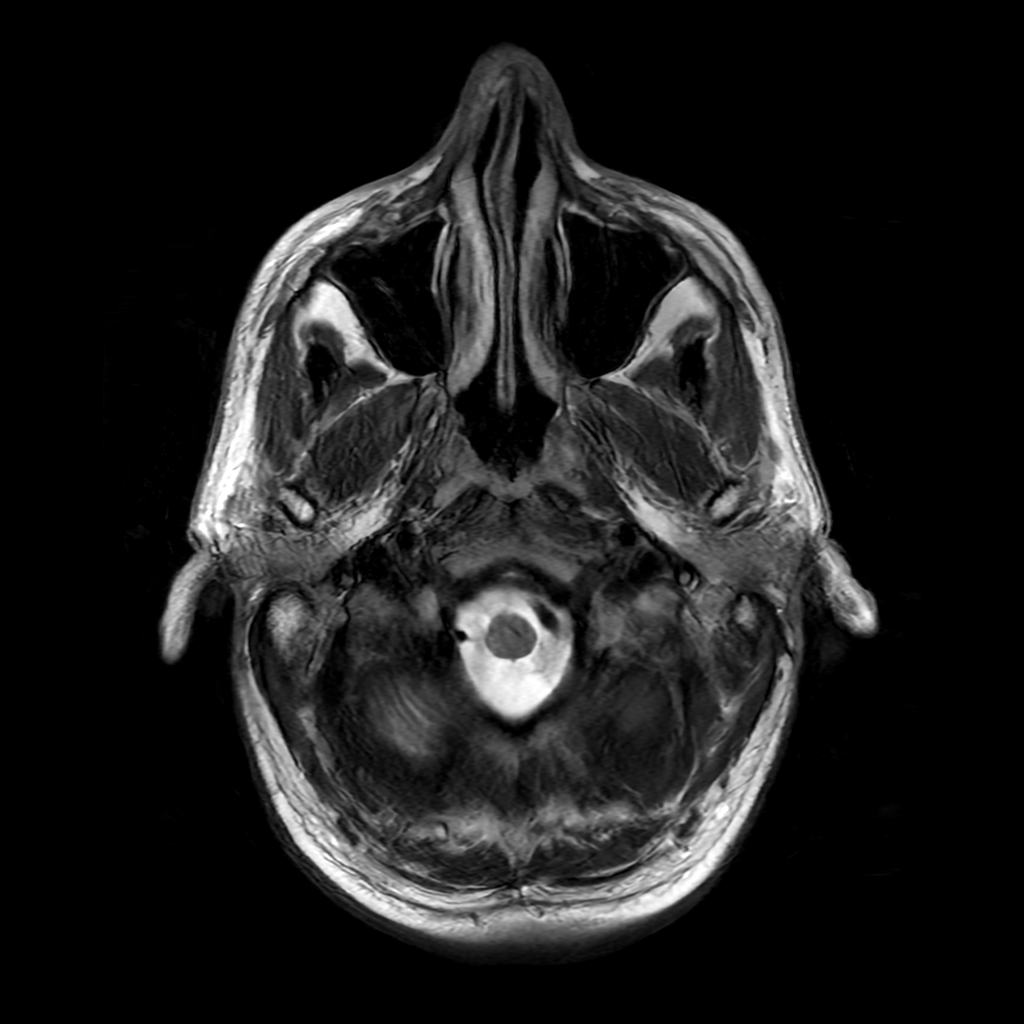
[im 24/24]
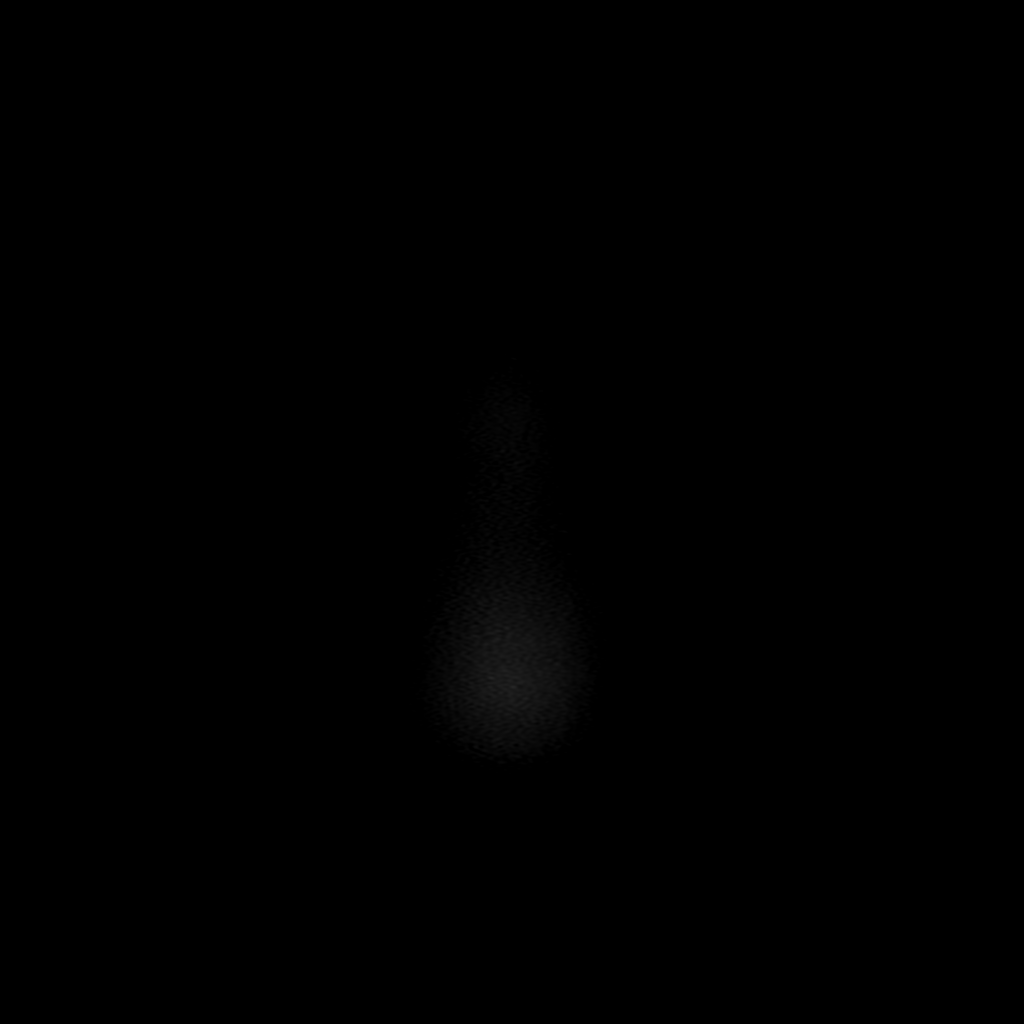

[Series 9: T2 · coronal · 5.0mm · 0.39mm/px · 2 of 29 slices shown (2 of 4)]
[im 1/29]
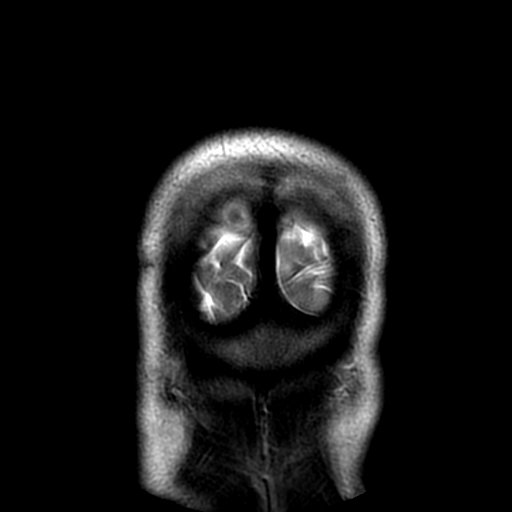
[im 29/29]
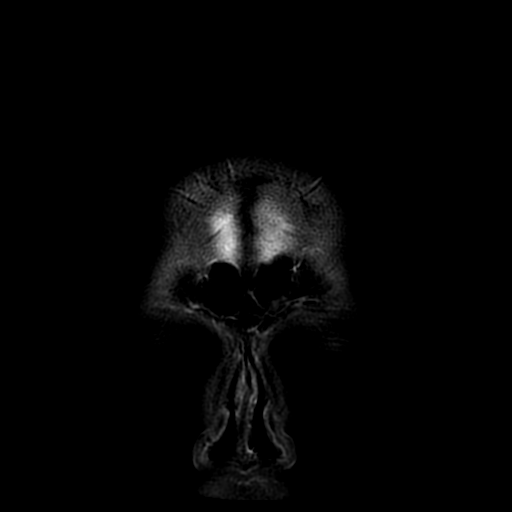

[Series 12: T2 · sagittal · 3.0mm · 0.43mm/px · 1 of 15 slices shown (3 of 4)]
[im 1/15]
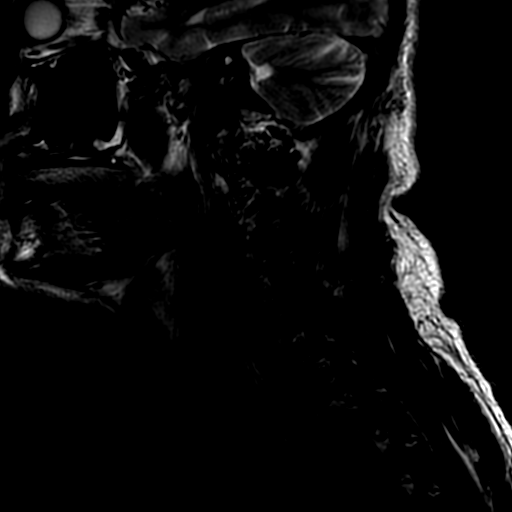

[Series 13: STIR · sagittal · 3.0mm · 0.43mm/px · 1 of 15 slices shown]
[im 1/15]
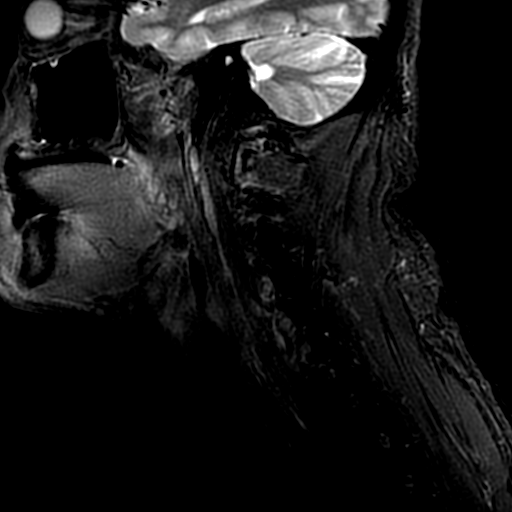

[Series 14: T1 · sagittal · 3.0mm · 0.43mm/px · 1 of 15 slices shown (1 of 2)]
[im 1/15]
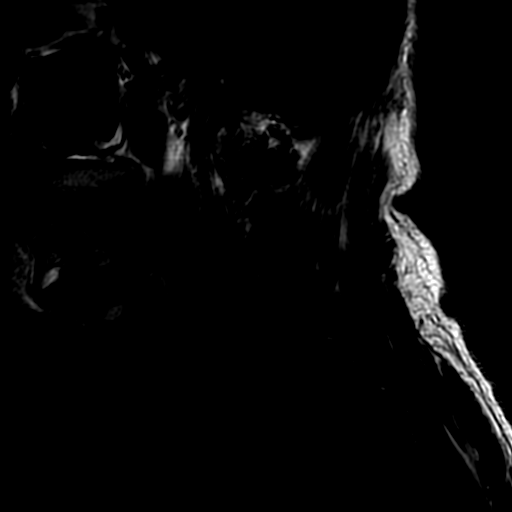

[Series 15: T2 · axial · 3.0mm · 0.35mm/px · z∈[-224,-144]mm · 2 of 24 slices shown (4 of 4)]
[im 1/24]
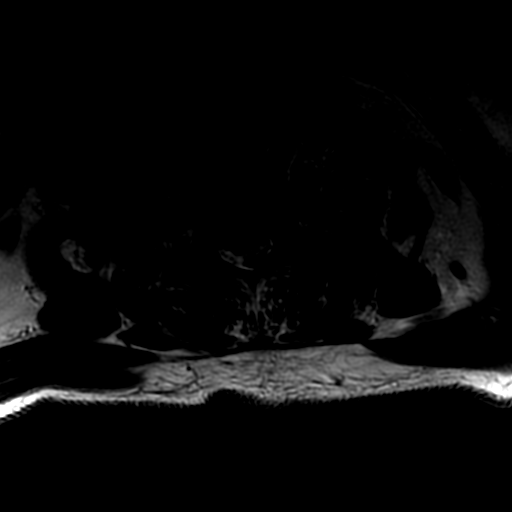
[im 24/24]
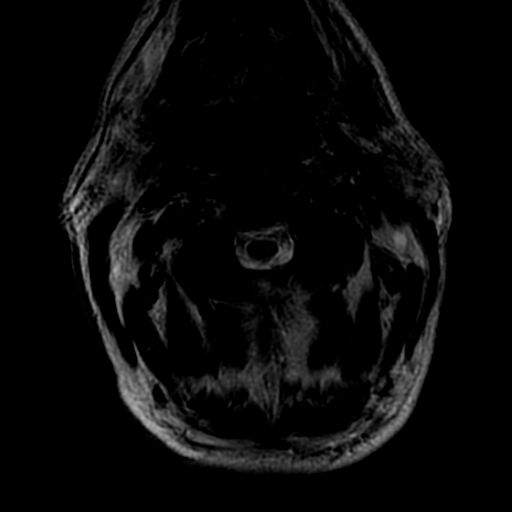

[Series 16: T1 · axial · 3.0mm · 0.70mm/px · z∈[-224,-144]mm · 2 of 24 slices shown (2 of 2)]
[im 1/24]
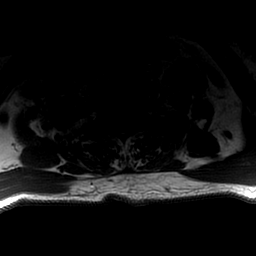
[im 24/24]
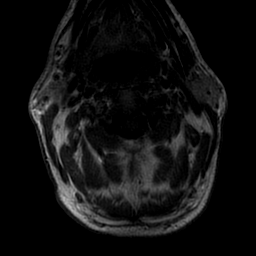

[21 of 48 positions shown; findings below may reference images not displayed]

FINDINGS: The study is partially degraded by motion.

Alignment: Straightening of the cervical curvature.

Vertebrae: Status post C5-C7 ACDF. The anterior plate appear
anteriorly displaced into the soft tissues at the C7 level with the
corresponding screws anteriorly position within the vertebral body.

Cord: Evaluation of the cord is limited by motion artifact. No gross
cord signal abnormality seen.

Posterior Fossa, vertebral arteries, paraspinal tissues: Negative.

Disc levels:

C2-3: No spinal canal or neural foraminal stenosis.

C3-4: Small posterior disc protrusion resulting in minimal spinal
canal stenosis, improved from prior MRI. Uncovertebral and facet
degenerative changes resulting in mild left neural foraminal
narrowing.

C4-5: Minimal posterior disc protrusion. No significant spinal canal
stenosis. Uncovertebral and facet degenerative changes resulting in
mild left neural foraminal narrowing.

C5-6: Interval resolution of the spinal canal stenosis.
Uncovertebral and facet degenerative changes resulting in mild right
and moderate left neural foraminal narrowing, improved from prior
MRI.

C6-7: Residual endplate ridging resulting in mild spinal canal
stenosis. Uncovertebral and facet degenerative changes resulting in
moderate left neural foraminal narrowing.

C7-T1: Small posterior disc protrusion. No significant spinal canal
or neural foraminal stenosis.
IMPRESSION: 1. Status post ACDF at C5-C6-C7 with anterior displacement of the
anterior plate and screws at the C7 level.
2. Interval improvement of the spinal canal stenosis with mild
residual stenosis at the C6-7 level.
3. Interval improvement of the degree of neural foraminal stenosis
with residual moderate left neural foraminal stenosis at C5-6 and
C6-7.

## 2020-12-06 MED ORDER — FENTANYL CITRATE (PF) 100 MCG/2ML IJ SOLN
50.0000 ug | Freq: Once | INTRAMUSCULAR | Status: AC
  Administered 2020-12-06: 50 ug via INTRAVENOUS
  Filled 2020-12-06: qty 2

## 2020-12-06 MED ORDER — OXYCODONE HCL 5 MG PO TABS: 5.0000 mg | ORAL_TABLET | Freq: Four times a day (QID) | ORAL | 0 refills | Status: DC | PRN

## 2020-12-06 MED ORDER — HYDROMORPHONE HCL 1 MG/ML IJ SOLN
1.0000 mg | Freq: Once | INTRAMUSCULAR | Status: AC
  Administered 2020-12-06: 1 mg via INTRAVENOUS
  Filled 2020-12-06: qty 1

## 2020-12-06 MED ORDER — HYDROMORPHONE HCL 1 MG/ML IJ SOLN
1.0000 mg | Freq: Once | INTRAMUSCULAR | Status: AC
Start: 2020-12-06 — End: 2020-12-06
  Administered 2020-12-06: 1 mg via INTRAVENOUS
  Filled 2020-12-06: qty 1

## 2020-12-06 MED ORDER — DEXAMETHASONE SODIUM PHOSPHATE 10 MG/ML IJ SOLN
10.0000 mg | Freq: Once | INTRAMUSCULAR | Status: AC
  Administered 2020-12-06: 10 mg via INTRAVENOUS
  Filled 2020-12-06: qty 1

## 2020-12-06 NOTE — Progress Notes (Signed)
Patient ID: Frank Page, male   DOB: [birth year], 50 y.o.   MRN: [id number] BP (!) 136/106 (BP Location: Left Arm)   Pulse 86   Temp 98.1 F (36.7 C) (Oral)   Resp 18   Ht 5\' 2"  (1.575 m)   Wt 58.4 kg   SpO2 100%   BMI 23.55 kg/m  Alert, in distress Moving left upper extremity well. Weakness right deltoid, biceps, triceps, grip. May be pain limited.  Unable to abduct right shoulder to 90 degrees. Unable to passively able to abduct right shoulder past 90 degrees, was guarding his shoulder and right upper extremity.  MRI, CT do not show intracanal pathology, unable to understand weakness in the deltoid. Cord is normal, ct shows the plate is backing out from 11/22/2020, but no hardware near a nerve or the cord. Will arrange for visit next week, will most likely revise the plate. But unable to account for the shoulder pain with today's imaging.  Pain started 2-3 days ago. No antecedent trauma

## 2020-12-06 NOTE — ED Notes (Signed)
Patient transported to MRI 

## 2020-12-06 NOTE — ED Notes (Signed)
Assisted pt to bathroom

## 2020-12-06 NOTE — Progress Notes (Signed)
Orthopedic Tech Progress Note Patient Details:  Frank Page 27-Aug-1970 [id number]  Ortho Devices Type of Ortho Device: Shoulder immobilizer Ortho Device/Splint Location: RUE Ortho Device/Splint Interventions: Ordered,Application   Post Interventions Patient Tolerated: Well Instructions Provided: Adjustment of device   [name] 12/06/2020, 3:22 PM

## 2020-12-06 NOTE — Discharge Instructions (Addendum)
Follow back up with your neurosurgeon.  Make an appointment in his office.  Wear the shoulder immobilizer for comfort.  Make an appointment follow-up with orthopedic surgery in [place] Dr. Romeo Apple.  Take your pain medication as directed

## 2020-12-06 NOTE — ED Provider Notes (Signed)
[facility] EMERGENCY DEPARTMENT Provider Note   CSN: [id number] Arrival date & time: 12/06/20  0130     History Chief Complaint  Patient presents with  . Shoulder Pain    Frank Page is a 50 y.o. male.  Patient with a history of substance abuse, chronic pain, recent spinal fusion by Dr. Franky Macho on April 22 here with right arm pain, shoulder pain, and weakness.  Pain has been ongoing for the past 2 or 3 days.  Reports severe pain radiating from his right neck down his right arm causing weakness and numbness and tingling in his right arm.  Pain has progressively worsened since his surgery.  Has been taking Percocet at home and trying to stretch it out.  He called his neurosurgeon but did not get a response.  He comes in tonight because of severe pain and difficulty using his right arm.  He denies any facial droop.  He denies any leg pain or leg swelling.  No chest pain or shortness of breath.  No fever, chills, nausea or vomiting  The history is provided by the patient.  Shoulder Pain Associated symptoms: back pain and neck pain   Associated symptoms: no fever        Past Medical History:  Diagnosis Date  . Alcohol abuse 09/05/2013  . Alcohol withdrawal (HCC) 09/05/2013   History of alcohol withdrawal seizures  . Anxiety   . Chronic pain syndrome 09/05/2013  . Dyspnea   . Herniated disc   . Nonunion of fracture   . Psoriasis   . Thrombocytopenia, unspecified (HCC) 09/05/2013    Patient Active Problem List   Diagnosis Date Noted  . HNP (herniated nucleus pulposus), cervical 11/22/2020  . Alcohol dependence with withdrawal with complication (HCC) 09/02/2014  . Substance induced mood disorder (HCC) 09/02/2014  . Suicidal ideation   . Alcoholic hepatitis 09/06/2013  . Pneumonia 09/05/2013  . Alcohol abuse 09/05/2013  . Transaminitis 09/05/2013  . Thrombocytopenia, unspecified (HCC) 09/05/2013  . Chronic pain syndrome 09/05/2013    Past Surgical History:  Procedure  Laterality Date  . ANTERIOR CERVICAL DECOMP/DISCECTOMY FUSION N/A 11/22/2020   Procedure: Cervical Five-Six Cervical Six-Seven Anterior Cervical Decompression/Discectomy/Fusion;  Surgeon: Coletta Memos, MD;  Location: [facility] OR;  Service: Neurosurgery;  Laterality: N/A;  . HERNIA REPAIR    . JOINT REPLACEMENT    . ORIF FEMORAL SHAFT FRACTURE W/ PLATES AND SCREWS         No family history on file.  Social History   Tobacco Use  . Smoking status: Current Every Day Smoker    Packs/day: 2.00    Years: 4.00    Pack years: 8.00    Types: Cigarettes  . Smokeless tobacco: Never Used  Vaping Use  . Vaping Use: Never used  Substance Use Topics  . Alcohol use: Yes    Alcohol/week: 168.0 standard drinks    Types: 168 Cans of beer per week    Comment: heavily  . Drug use: Yes    Types: Marijuana    Home Medications Prior to Admission medications   Medication Sig Start Date End Date Taking? Authorizing Provider  baclofen (LIORESAL) 20 MG tablet Take 20 mg by mouth 2 (two) times daily. 10/02/20   [provider]  methocarbamol (ROBAXIN) 500 MG tablet Take 1 tablet (500 mg total) by mouth 2 (two) times daily as needed for muscle spasms. 09/28/20   Eber Hong, MD  methylPREDNISolone (MEDROL DOSEPAK) 4 MG TBPK tablet Taper over 6 days please  Patient not taking: No sig reported 09/28/20   Eber HongMiller, Brian, MD  naproxen (NAPROSYN) 500 MG tablet Take 1 tablet (500 mg total) by mouth 2 (two) times daily with a meal. Patient not taking: No sig reported 09/28/20   Eber HongMiller, Brian, MD  pantoprazole (PROTONIX) 40 MG tablet Take 1 tablet (40 mg total) by mouth daily. 07/12/20   Gilda CreasePollina, Christopher J, MD  sulindac (CLINORIL) 200 MG tablet Take 200 mg by mouth 2 (two) times daily. 10/02/20   [provider]  dicyclomine (BENTYL) 20 MG tablet Take 1 tablet (20 mg total) by mouth 3 (three) times daily before meals. 07/12/20 09/28/20  Gilda CreasePollina, Christopher J, MD  DULoxetine (CYMBALTA) 30 MG capsule  Take 1 capsule (30 mg total) by mouth daily. For depression 09/05/14 09/28/20  Rankin, Shuvon B, NP  gabapentin (NEURONTIN) 100 MG capsule Take 2 capsules (200 mg total) by mouth 2 (two) times daily. For agitation 09/05/14 09/28/20  Rankin, Shuvon B, NP  sucralfate (CARAFATE) 1 g tablet Take 1 tablet (1 g total) by mouth 4 (four) times daily -  with meals and at bedtime. 07/12/20 09/28/20  Gilda CreasePollina, Christopher J, MD  traZODone (DESYREL) 150 MG tablet Take 1 tablet (150 mg total) by mouth at bedtime as needed for sleep. 09/05/14 09/28/20  Rankin, Shuvon B, NP    Allergies    Penicillins  Review of Systems   Review of Systems  Constitutional: Negative for activity change, appetite change and fever.  HENT: Negative for congestion and rhinorrhea.   Respiratory: Negative for cough, chest tightness and shortness of breath.   Cardiovascular: Negative for chest pain.  Gastrointestinal: Negative for abdominal pain, nausea and vomiting.  Genitourinary: Negative for dysuria and hematuria.  Musculoskeletal: Positive for back pain and neck pain.  Skin: Negative for rash.  Neurological: Positive for weakness, numbness and headaches.   all other systems are negative except as noted in the HPI and PMH.    Physical Exam Updated Vital Signs BP (!) 172/102   Pulse 88   Temp 98.1 F (36.7 C)   Resp 18   Ht 5\' 2"  (1.575 m)   Wt 58.4 kg   SpO2 98%   BMI 23.55 kg/m   Physical Exam Vitals and nursing note reviewed.  Constitutional:      General: He is not in acute distress.    Appearance: He is well-developed. He is not ill-appearing.     Comments: Uncomfortable  HENT:     Head: Normocephalic and atraumatic.     Comments: hoarse voice    Mouth/Throat:     Pharynx: No oropharyngeal exudate.  Eyes:     Conjunctiva/sclera: Conjunctivae normal.     Pupils: Pupils are equal, round, and reactive to light.  Neck:     Comments: No meningismus. TTP R paraspinal cervical and upper back Cardiovascular:      Rate and Rhythm: Normal rate and regular rhythm.     Heart sounds: Normal heart sounds. No murmur heard.   Pulmonary:     Effort: Pulmonary effort is normal. No respiratory distress.     Breath sounds: Normal breath sounds.  Chest:     Chest wall: No tenderness.  Abdominal:     Palpations: Abdomen is soft.     Tenderness: There is no abdominal tenderness. There is no guarding or rebound.  Musculoskeletal:     Cervical back: Normal range of motion and neck supple. Tenderness present.     Comments:  Pain with range of motion  of the right shoulder.  Is not warm or red or erythematous.  Difficulty raising right arm against gravity. Intact radial pulse and cardinal hand movements.  Skin:    General: Skin is warm.  Neurological:     Mental Status: He is alert and oriented to person, place, and time.     Cranial Nerves: No cranial nerve deficit.     Motor: No abnormal muscle tone.     Coordination: Coordination normal.     Comments: difficulty raising right arm off the bed.  Significant weakness in grip strength and forearm flexion and extension.'  Equal lower extremity strength.  No facial droop  Psychiatric:        Behavior: Behavior normal.     ED Results / Procedures / Treatments   Labs (all labs ordered are listed, but only abnormal results are displayed) Labs Reviewed  BASIC METABOLIC PANEL - Abnormal; Notable for the following components:      Result Value   Glucose, Bld 116 (*)    All other components within normal limits  SARS CORONAVIRUS 2 (TAT 6-24 HRS)  CBC WITH DIFFERENTIAL/PLATELET  TROPONIN I (HIGH SENSITIVITY)  TROPONIN I (HIGH SENSITIVITY)    EKG EKG Interpretation  Date/Time:  Friday Dec 06 2020 02:08:39 EDT Ventricular Rate:  87 PR Interval:  123 QRS Duration: 82 QT Interval:  376 QTC Calculation: 453 R Axis:   83 Text Interpretation: Sinus rhythm No significant change was found Confirmed by Glynn Octave [phone]) on 12/06/2020 2:18:05  AM   Radiology DG Shoulder Right  Result Date: 12/06/2020 CLINICAL DATA:  Right shoulder pain. EXAM: RIGHT SHOULDER - 2+ VIEW COMPARISON:  None. FINDINGS: There is no evidence of fracture or dislocation. There is no evidence of arthropathy or other focal bone abnormality. Soft tissues are unremarkable. IMPRESSION: Negative. Electronically Signed   By: Aram Candela M.D.   On: 12/06/2020 04:00   CT Head Wo Contrast  Result Date: 12/06/2020 CLINICAL DATA:  Right shoulder pain. EXAM: CT HEAD WITHOUT CONTRAST TECHNIQUE: Contiguous axial images were obtained from the base of the skull through the vertex without intravenous contrast. COMPARISON:  August 27, 2015 FINDINGS: Brain: No evidence of acute infarction, hemorrhage, hydrocephalus, extra-axial collection or mass lesion/mass effect. Vascular: No hyperdense vessel or unexpected calcification. Skull: Normal. Negative for fracture or focal lesion. Sinuses/Orbits: No acute finding. Other: None. IMPRESSION: No acute intracranial abnormality. Electronically Signed   By: Aram Candela M.D.   On: 12/06/2020 03:55   CT Cervical Spine Wo Contrast  Result Date: 12/06/2020 CLINICAL DATA:  Right shoulder pain and right upper extremity weakness. EXAM: CT CERVICAL SPINE WITHOUT CONTRAST TECHNIQUE: Multidetector CT imaging of the cervical spine was performed without intravenous contrast. Multiplanar CT image reconstructions were also generated. COMPARISON:  September 28, 2020 FINDINGS: Alignment: Normal. Skull base and vertebrae: No acute fracture. A metallic density fusion plate and screws are seen along the anterior aspect of the C5, C6 and C7 vertebral bodies. This represents a new finding when compared to the prior study. Soft tissues and spinal canal: No prevertebral fluid or swelling. No visible canal hematoma. Disc levels: Mild to moderate severity anterior osteophyte formation is seen at the level of C4-C5. Operative material is seen within the C5-C6 and  C6-C7 intervertebral disc spaces. Mild intervertebral disc space narrowing is noted at the level of C7-T1. Mild bilateral multilevel facet joint hypertrophy is noted. Upper chest: Negative. Other: None. IMPRESSION: 1. Postoperative changes consistent with anterior cervical discectomy and fusion at the  levels of C5-C6 and C6-C7. 2. No acute cervical spine fracture. Electronically Signed   By: Aram Candela M.D.   On: 12/06/2020 04:00    Procedures Procedures   Medications Ordered in ED Medications  HYDROmorphone (DILAUDID) injection 1 mg (has no administration in time range)    ED Course  I have reviewed the triage vital signs and the nursing notes.  Pertinent labs & imaging results that were available during my care of the patient were reviewed by me and considered in my medical decision making (see chart for details).    MDM Rules/Calculators/A&P                         Progressively worsening right arm weakness and numbness after ACDF by Dr. Franky Macho on April 22.  MRI not available.  CT C-spine will be obtained.  Discussed with NP [facility] with neurosurgery.  She reviewed patient's CT scan and found nothing overtly structurally wrong.  CT scan shows stable postoperative changes.  CT head is negative.  IV Decadron given at neurosurgery request.  Patient still with profound right upper extremity weakness and numbness and pain. Will need transfer for MRI.  Neurosurgery to see on arrival to [facility] after MRI.  Discussed with Dr. Jacqulyn Bath who accepts Final Clinical Impression(s) / ED Diagnoses Final diagnoses:  Cervical radiculopathy    Rx / DC Orders ED Discharge Orders    None       Verda Mehta, Jeannett Senior, MD 12/06/20 0502

## 2020-12-06 NOTE — ED Provider Notes (Signed)
Blood pressure (!) 147/86, pulse 81, temperature 98.1 F (36.7 C), temperature source Oral, resp. rate 18, height 5\' 2"  (1.575 m), weight 58.4 kg, SpO2 97 %.   In short, Frank Page is a 50 y.o. male with a chief complaint of Shoulder Pain .  Refer to the original H&P for additional details.  Patient here as a transfer from [facility] for MRI s/p NSG procedure. Scans are ordered and pending.     44, MD 12/06/20 [phone]

## 2020-12-06 NOTE — ED Provider Notes (Addendum)
MRI results are back.  Dr. Franky Macho aware the patient is here.  He had spoken with the patient said he was going to review the MRI scans and be back.'s been a little bit of time so I have reconsulted neurosurgery.  Did this around 1:30 PM.  Still waiting to hear back from them.  Clinically I suspect patient can follow-up with them in the clinic.  Patient was referred down here for MRI and for evaluation by neurosurgery.   Vanetta Mulders, MD 12/06/20 1412  Discussed with neurosurgery Dr. Franky Macho.  Patient can be discharged home based on the MRI cervical spine and follow-up with his office.  He was somewhat concerned about the right shoulder range of motion being limited.  Patient with good cap refill.  We will go ahead and give shoulder immobilizer and have him follow-up with Dr. Romeo Apple orthopedics in [place] area.  We will go ahead and renew patient's pain medication    Vanetta Mulders, MD 12/06/20 1455

## 2020-12-06 NOTE — ED Notes (Signed)
Unable to locate patient.

## 2020-12-06 NOTE — ED Notes (Signed)
Patient transported to CT 

## 2020-12-06 NOTE — ED Triage Notes (Signed)
Pt c/o right shoulder pain x a few days. Pt had recent C-spine sx

## 2020-12-06 NOTE — ED Triage Notes (Signed)
Patient with R shoulder x a few days, recent c-spine sx, CT at [facility] was negative, here for MRI

## 2020-12-10 ENCOUNTER — Telehealth: Payer: Self-pay | Admitting: Orthopedic Surgery

## 2020-12-10 NOTE — Telephone Encounter (Signed)
Call received from patient today, 12/10/20, following

## 2020-12-12 DIAGNOSIS — M25512 Pain in left shoulder: Secondary | ICD-10-CM | POA: Diagnosis not present

## 2020-12-12 DIAGNOSIS — M542 Cervicalgia: Secondary | ICD-10-CM | POA: Diagnosis not present

## 2020-12-12 DIAGNOSIS — M25511 Pain in right shoulder: Secondary | ICD-10-CM | POA: Diagnosis not present

## 2020-12-12 DIAGNOSIS — G8928 Other chronic postprocedural pain: Secondary | ICD-10-CM | POA: Diagnosis not present

## 2020-12-12 DIAGNOSIS — E559 Vitamin D deficiency, unspecified: Secondary | ICD-10-CM | POA: Diagnosis not present

## 2020-12-12 DIAGNOSIS — Z1339 Encounter for screening examination for other mental health and behavioral disorders: Secondary | ICD-10-CM | POA: Diagnosis not present

## 2020-12-12 DIAGNOSIS — F17210 Nicotine dependence, cigarettes, uncomplicated: Secondary | ICD-10-CM | POA: Diagnosis not present

## 2020-12-12 DIAGNOSIS — M25552 Pain in left hip: Secondary | ICD-10-CM | POA: Diagnosis not present

## 2020-12-12 DIAGNOSIS — Z1159 Encounter for screening for other viral diseases: Secondary | ICD-10-CM | POA: Diagnosis not present

## 2020-12-12 DIAGNOSIS — M25551 Pain in right hip: Secondary | ICD-10-CM | POA: Diagnosis not present

## 2020-12-12 DIAGNOSIS — Z9889 Other specified postprocedural states: Secondary | ICD-10-CM | POA: Diagnosis not present

## 2020-12-12 DIAGNOSIS — Z79899 Other long term (current) drug therapy: Secondary | ICD-10-CM | POA: Diagnosis not present

## 2020-12-12 DIAGNOSIS — Z1331 Encounter for screening for depression: Secondary | ICD-10-CM | POA: Diagnosis not present

## 2020-12-12 DIAGNOSIS — M129 Arthropathy, unspecified: Secondary | ICD-10-CM | POA: Diagnosis not present

## 2020-12-17 ENCOUNTER — Emergency Department (HOSPITAL_COMMUNITY)
Admission: EM | Admit: 2020-12-17 | Discharge: 2020-12-17 | Disposition: A | Discharge status: Home / Self Care | Payer: Medicare Other | Attending: Emergency Medicine | Admitting: Emergency Medicine

## 2020-12-17 ENCOUNTER — Emergency Department (HOSPITAL_COMMUNITY): Payer: Medicare Other

## 2020-12-17 ENCOUNTER — Encounter (HOSPITAL_COMMUNITY): Payer: Self-pay | Admitting: *Deleted

## 2020-12-17 ENCOUNTER — Other Ambulatory Visit: Payer: Self-pay

## 2020-12-17 DIAGNOSIS — R030 Elevated blood-pressure reading, without diagnosis of hypertension: Secondary | ICD-10-CM | POA: Diagnosis not present

## 2020-12-17 DIAGNOSIS — M25511 Pain in right shoulder: Secondary | ICD-10-CM | POA: Insufficient documentation

## 2020-12-17 DIAGNOSIS — M7989 Other specified soft tissue disorders: Secondary | ICD-10-CM | POA: Insufficient documentation

## 2020-12-17 DIAGNOSIS — Z6825 Body mass index (BMI) 25.0-25.9, adult: Secondary | ICD-10-CM | POA: Diagnosis not present

## 2020-12-17 DIAGNOSIS — F17210 Nicotine dependence, cigarettes, uncomplicated: Secondary | ICD-10-CM | POA: Diagnosis not present

## 2020-12-17 DIAGNOSIS — R600 Localized edema: Secondary | ICD-10-CM | POA: Diagnosis not present

## 2020-12-17 DIAGNOSIS — M4802 Spinal stenosis, cervical region: Secondary | ICD-10-CM | POA: Diagnosis not present

## 2020-12-17 DIAGNOSIS — R609 Edema, unspecified: Secondary | ICD-10-CM

## 2020-12-17 LAB — URINALYSIS, ROUTINE W REFLEX MICROSCOPIC
Bilirubin Urine: NEGATIVE
Glucose, UA: NEGATIVE mg/dL
Hgb urine dipstick: NEGATIVE
Ketones, ur: NEGATIVE mg/dL
Leukocytes,Ua: NEGATIVE
Nitrite: NEGATIVE
Protein, ur: NEGATIVE mg/dL
Specific Gravity, Urine: 1.002 — ABNORMAL LOW (ref 1.005–1.030)
pH: 7 (ref 5.0–8.0)

## 2020-12-17 LAB — COMPREHENSIVE METABOLIC PANEL
ALT: 15 U/L (ref 0–44)
AST: 14 U/L — ABNORMAL LOW (ref 15–41)
Albumin: 3.5 g/dL (ref 3.5–5.0)
Alkaline Phosphatase: 60 U/L (ref 38–126)
Anion gap: 9 (ref 5–15)
BUN: 11 mg/dL (ref 6–20)
CO2: 29 mmol/L (ref 22–32)
Calcium: 9.1 mg/dL (ref 8.9–10.3)
Chloride: 102 mmol/L (ref 98–111)
Creatinine, Ser: 0.87 mg/dL (ref 0.61–1.24)
GFR, Estimated: >60 mL/min (ref >60)
Glucose, Bld: 87 mg/dL (ref 70–99)
Potassium: 3.4 mmol/L — ABNORMAL LOW (ref 3.5–5.1)
Sodium: 140 mmol/L (ref 135–145)
Total Bilirubin: 0.7 mg/dL (ref 0.3–1.2)
Total Protein: 6.3 g/dL — ABNORMAL LOW (ref 6.5–8.1)

## 2020-12-17 LAB — CBC WITH DIFFERENTIAL/PLATELET
Abs Immature Granulocytes: 0.05 10*3/uL (ref 0.00–0.07)
Basophils Absolute: 0.1 10*3/uL (ref 0.0–0.1)
Basophils Relative: 1 %
Eosinophils Absolute: 0.4 10*3/uL (ref 0.0–0.5)
Eosinophils Relative: 5 %
HCT: 36.6 % — ABNORMAL LOW (ref 39.0–52.0)
Hemoglobin: 12.1 g/dL — ABNORMAL LOW (ref 13.0–17.0)
Immature Granulocytes: 1 %
Lymphocytes Relative: 33 %
Lymphs Abs: 2.7 10*3/uL (ref 0.7–4.0)
MCH: 30.6 pg (ref 26.0–34.0)
MCHC: 33.1 g/dL (ref 30.0–36.0)
MCV: 92.4 fL (ref 80.0–100.0)
Monocytes Absolute: 0.7 10*3/uL (ref 0.1–1.0)
Monocytes Relative: 8 %
Neutro Abs: 4.2 10*3/uL (ref 1.7–7.7)
Neutrophils Relative %: 52 %
Platelets: 250 10*3/uL (ref 150–400)
RBC: 3.96 MIL/uL — ABNORMAL LOW (ref 4.22–5.81)
RDW: 12.2 % (ref 11.5–15.5)
WBC: 8.1 10*3/uL (ref 4.0–10.5)
nRBC: 0 % (ref 0.0–0.2)

## 2020-12-17 LAB — RAPID URINE DRUG SCREEN, HOSP PERFORMED
Amphetamines: NOT DETECTED
Barbiturates: NOT DETECTED
Benzodiazepines: NOT DETECTED
Cocaine: NOT DETECTED
Opiates: NOT DETECTED
Tetrahydrocannabinol: POSITIVE — AB

## 2020-12-17 LAB — BRAIN NATRIURETIC PEPTIDE: B Natriuretic Peptide: 14 pg/mL (ref 0.0–100.0)

## 2020-12-17 IMAGING — DX DG CHEST 2V
2 series · 2 of 2 positions shown · non-contrast
Comparison: [DATE]

CLINICAL DATA: Extremity swelling

EXAM:
CHEST - 2 VIEW

[chest pa]
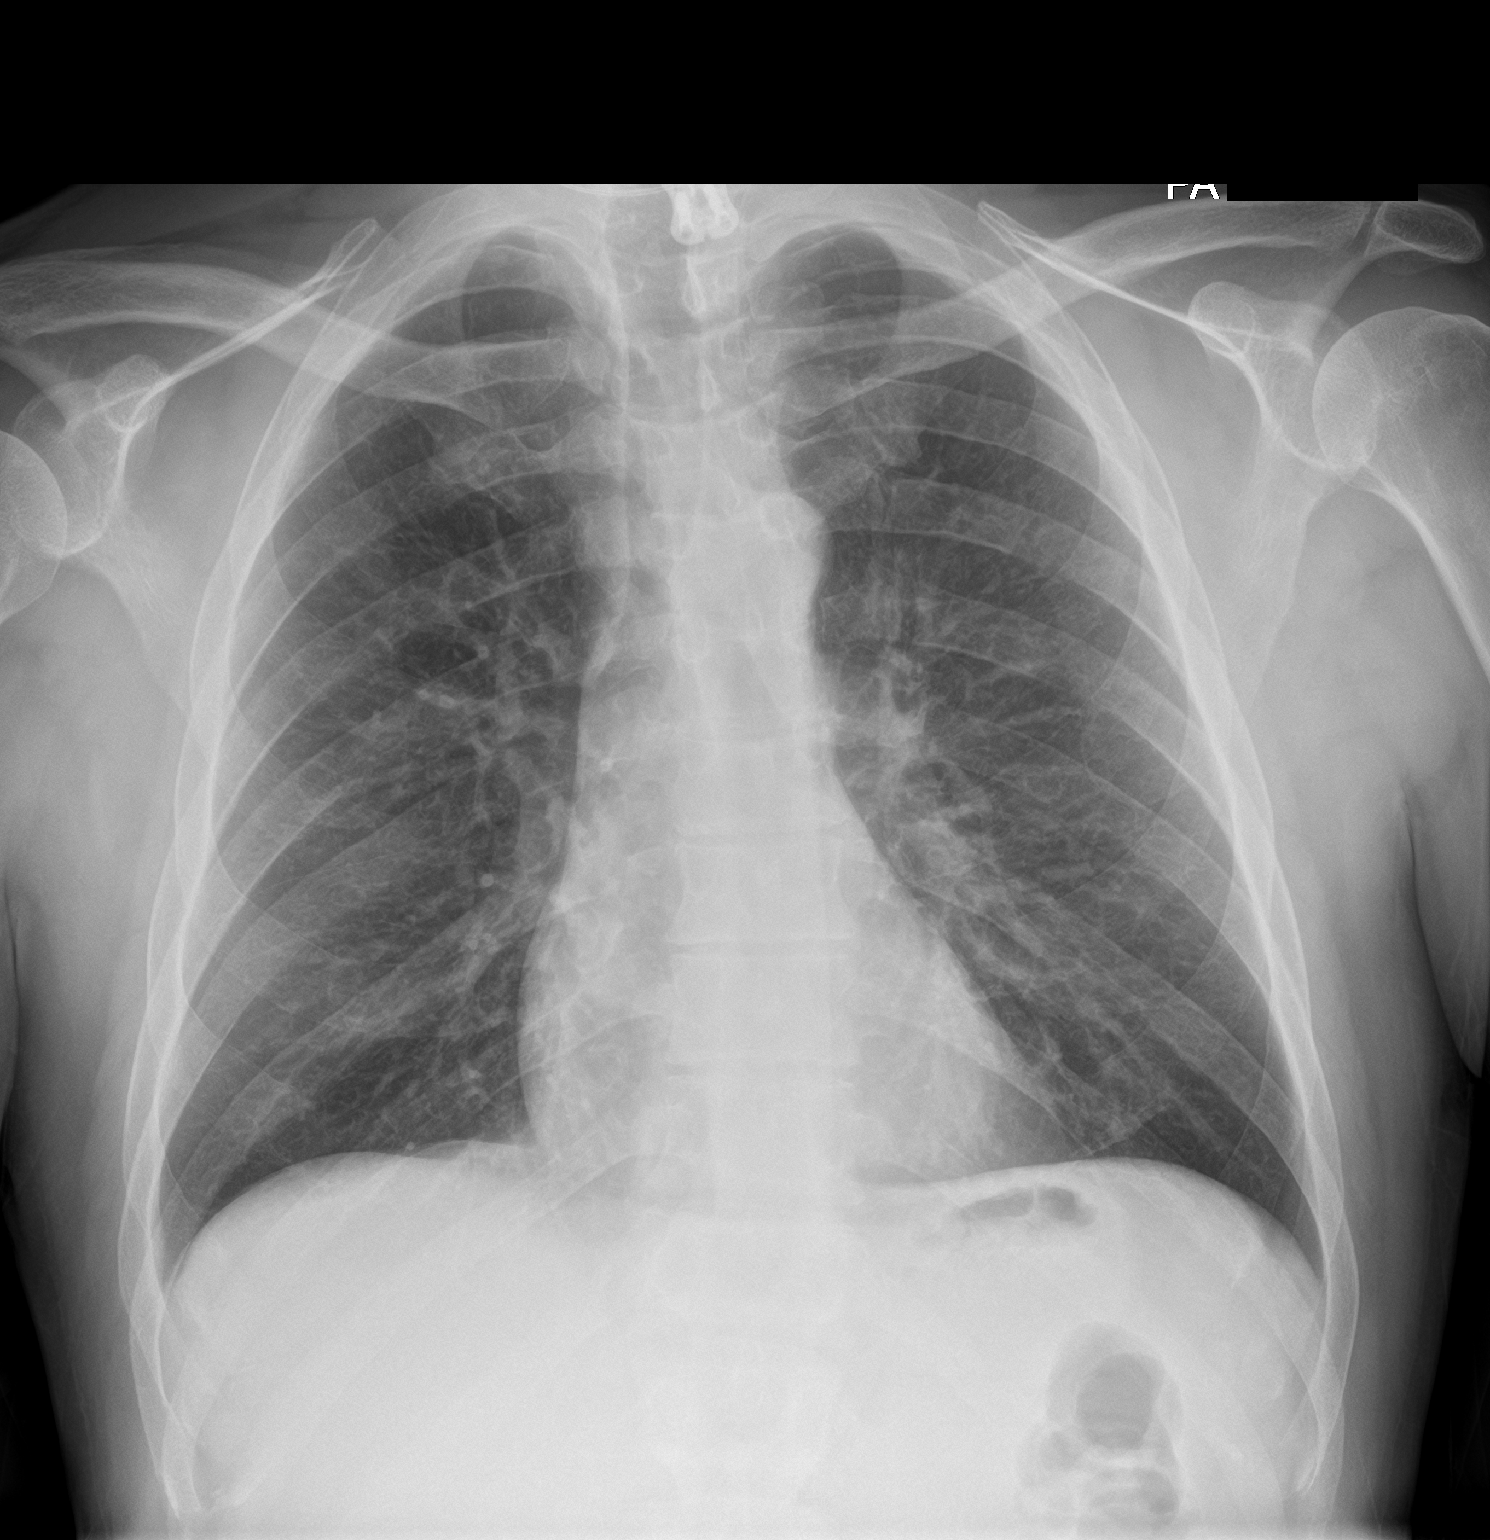

[chest lat]
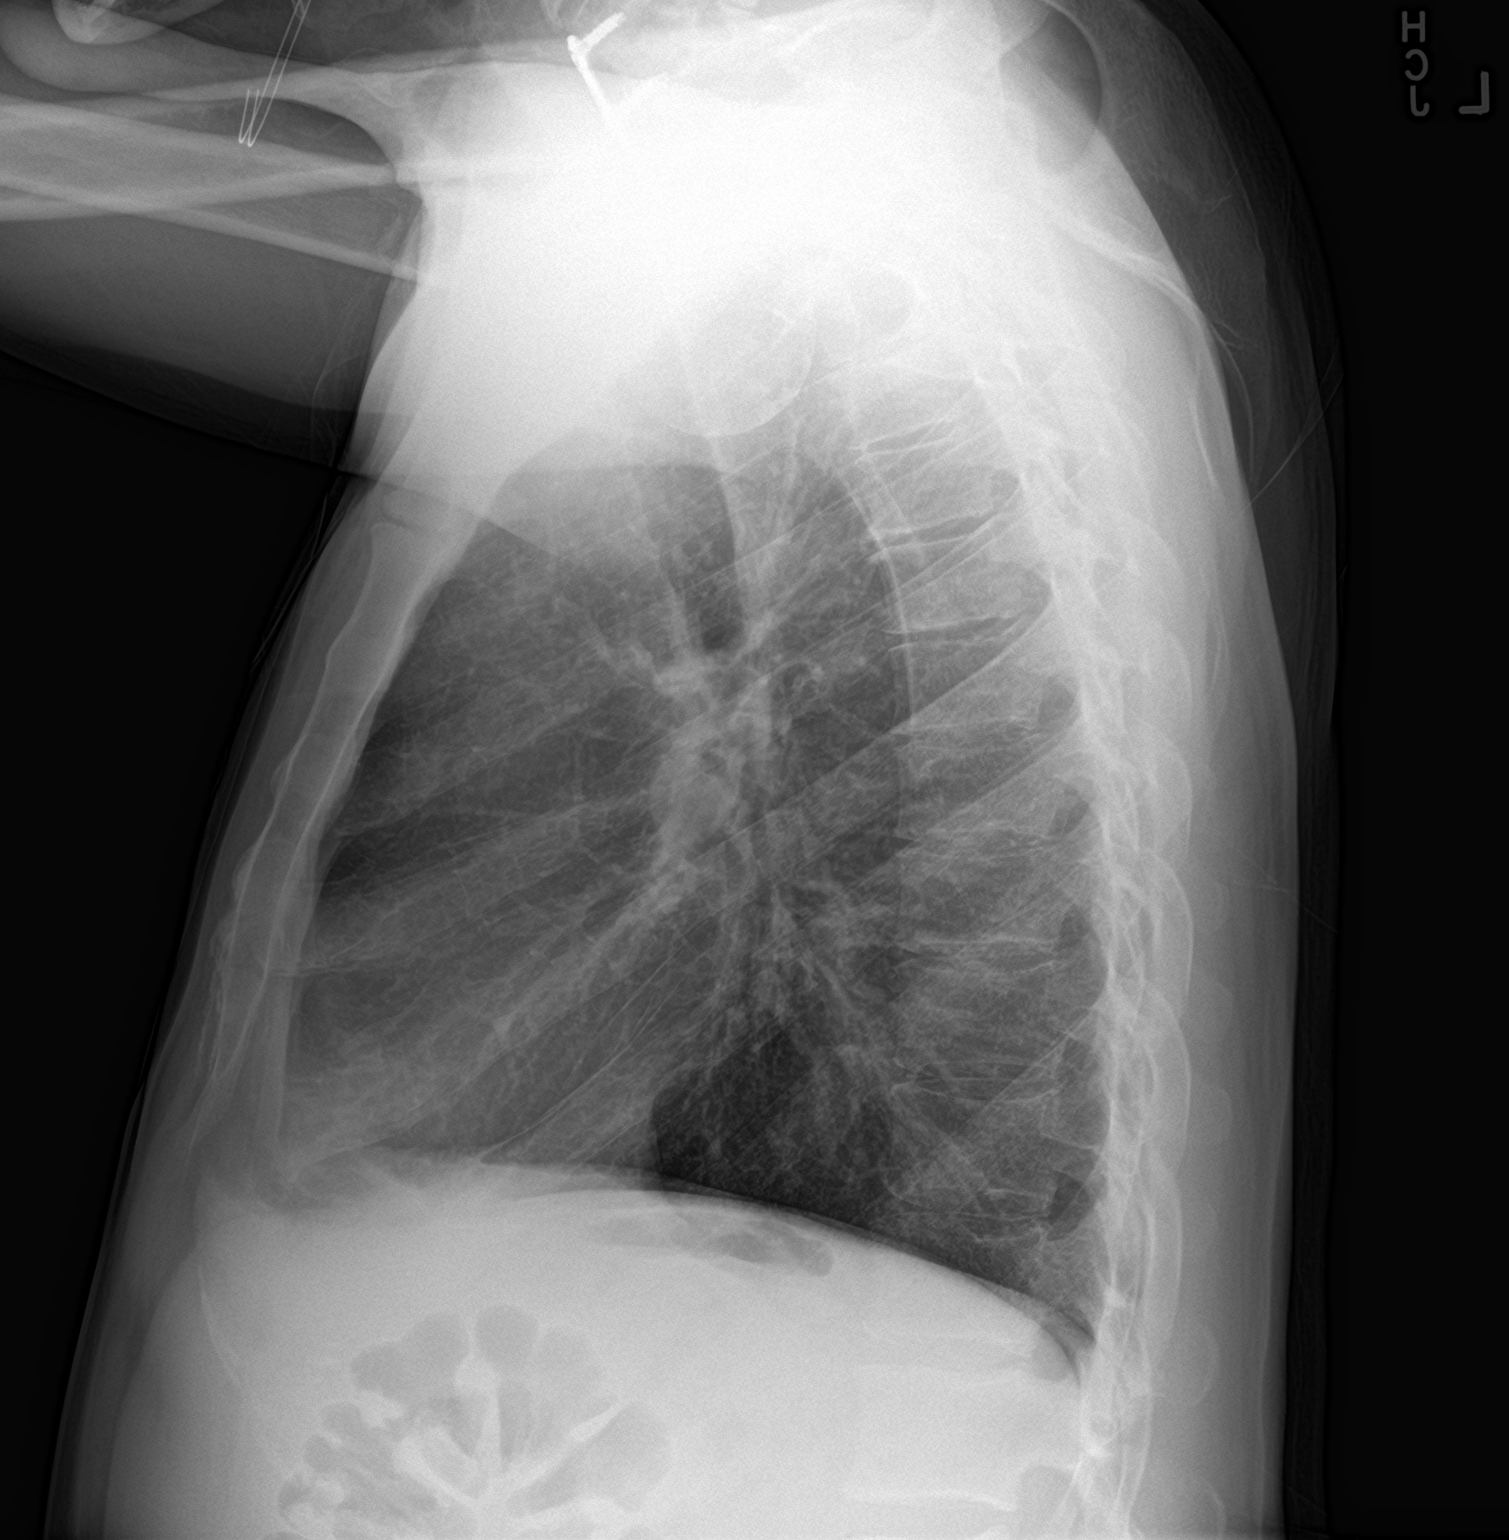

[2 of 2 positions shown; findings below may reference images not displayed]

FINDINGS: The heart size and mediastinal contours are within normal limits.
Both lungs are clear. The visualized skeletal structures are
unremarkable. Mild chronic appearing bronchitic changes. Surgical
hardware in the cervical spine
IMPRESSION: No active cardiopulmonary disease.

## 2020-12-17 MED ORDER — KETOROLAC TROMETHAMINE 30 MG/ML IJ SOLN
30.0000 mg | Freq: Once | INTRAMUSCULAR | Status: AC
Start: 2020-12-17 — End: 2020-12-17
  Administered 2020-12-17: 30 mg via INTRAMUSCULAR
  Filled 2020-12-17: qty 1

## 2020-12-17 NOTE — Discharge Instructions (Addendum)
The swelling raises, likely due to multiple reasons including low blood protein, relative inactivity and dietary issues.  Try to increase the amount of protein in your diet.  Stay on a low-sodium diet.  Try to get some daily exercise with walking.  It also helps to elevate your legs above your heart and use compression socks to help push the fluid back toward your heart.  The pain in your shoulder should be treated with heat and ibuprofen, until your MRI is done.  Then more specific treatment can be arranged.

## 2020-12-17 NOTE — ED Provider Notes (Signed)
[facility] EMERGENCY DEPARTMENT Provider Note   CSN: [id number] Arrival date & time: 12/17/20  1628     History Chief Complaint  Patient presents with  . Post-op Problem    Frank Page is a 50 y.o. male.  HPI He is here for evaluation of 2 problems.  He is concerned about swelling of his legs that have been present for several weeks.  He is also developed right shoulder pain over the last week or so, since he had surgery on his neck.  He states he saw his neurosurgeon this morning, and was referred for MRI of the right shoulder to be done later this month.  He states he has severe pain in the shoulder.  He states his legs "tingle."  He denies neck pain at this time.  He states the medicine he was prescribed recently for pain, oxycodone, did not help.  He has received 2 different oxycodone prescriptions from different providers recently as well as postoperatively from his neurosurgeon.  His cervical surgery was 11/22/2020.  He denies prior cardiac history.  There are no other known modifying factors.    Past Medical History:  Diagnosis Date  . Alcohol abuse 09/05/2013  . Alcohol withdrawal (HCC) 09/05/2013   History of alcohol withdrawal seizures  . Anxiety   . Chronic pain syndrome 09/05/2013  . Dyspnea   . Herniated disc   . Nonunion of fracture   . Psoriasis   . Thrombocytopenia, unspecified (HCC) 09/05/2013    Patient Active Problem List   Diagnosis Date Noted  . HNP (herniated nucleus pulposus), cervical 11/22/2020  . Alcohol dependence with withdrawal with complication (HCC) 09/02/2014  . Substance induced mood disorder (HCC) 09/02/2014  . Suicidal ideation   . Alcoholic hepatitis 09/06/2013  . Pneumonia 09/05/2013  . Alcohol abuse 09/05/2013  . Transaminitis 09/05/2013  . Thrombocytopenia, unspecified (HCC) 09/05/2013  . Chronic pain syndrome 09/05/2013    Past Surgical History:  Procedure Laterality Date  . ANTERIOR CERVICAL DECOMP/DISCECTOMY FUSION N/A 11/22/2020    Procedure: Cervical Five-Six Cervical Six-Seven Anterior Cervical Decompression/Discectomy/Fusion;  Surgeon: Coletta Memos, MD;  Location: [facility] OR;  Service: Neurosurgery;  Laterality: N/A;  . HERNIA REPAIR    . JOINT REPLACEMENT    . ORIF FEMORAL SHAFT FRACTURE W/ PLATES AND SCREWS         No family history on file.  Social History   Tobacco Use  . Smoking status: Current Every Day Smoker    Packs/day: 2.00    Years: 4.00    Pack years: 8.00    Types: Cigarettes  . Smokeless tobacco: Never Used  Vaping Use  . Vaping Use: Never used  Substance Use Topics  . Alcohol use: Yes    Alcohol/week: 168.0 standard drinks    Types: 168 Cans of beer per week    Comment: heavily  . Drug use: Yes    Types: Marijuana    Home Medications Prior to Admission medications   Medication Sig Start Date End Date Taking? Authorizing Provider  baclofen (LIORESAL) 20 MG tablet Take 20 mg by mouth 2 (two) times daily. 10/02/20   [provider]  methocarbamol (ROBAXIN) 500 MG tablet Take 1 tablet (500 mg total) by mouth 2 (two) times daily as needed for muscle spasms. 09/28/20   Eber Hong, MD  methylPREDNISolone (MEDROL DOSEPAK) 4 MG TBPK tablet Taper over 6 days please Patient not taking: No sig reported 09/28/20   Eber Hong, MD  naproxen (NAPROSYN) 500 MG tablet Take  1 tablet (500 mg total) by mouth 2 (two) times daily with a meal. Patient not taking: No sig reported 09/28/20   Eber Hong, MD  oxyCODONE (ROXICODONE) 5 MG immediate release tablet Take 1 tablet (5 mg total) by mouth every 6 (six) hours as needed for moderate pain or severe pain. 12/06/20   Vanetta Mulders, MD  pantoprazole (PROTONIX) 40 MG tablet Take 1 tablet (40 mg total) by mouth daily. 07/12/20   Gilda Crease, MD  sulindac (CLINORIL) 200 MG tablet Take 200 mg by mouth 2 (two) times daily. 10/02/20   [provider]  dicyclomine (BENTYL) 20 MG tablet Take 1 tablet (20 mg total) by mouth 3 (three)  times daily before meals. 07/12/20 09/28/20  Gilda Crease, MD  DULoxetine (CYMBALTA) 30 MG capsule Take 1 capsule (30 mg total) by mouth daily. For depression 09/05/14 09/28/20  Rankin, Shuvon B, NP  gabapentin (NEURONTIN) 100 MG capsule Take 2 capsules (200 mg total) by mouth 2 (two) times daily. For agitation 09/05/14 09/28/20  Rankin, Shuvon B, NP  sucralfate (CARAFATE) 1 g tablet Take 1 tablet (1 g total) by mouth 4 (four) times daily -  with meals and at bedtime. 07/12/20 09/28/20  Gilda Crease, MD  traZODone (DESYREL) 150 MG tablet Take 1 tablet (150 mg total) by mouth at bedtime as needed for sleep. 09/05/14 09/28/20  Rankin, Shuvon B, NP    Allergies    Penicillins  Review of Systems   Review of Systems  All other systems reviewed and are negative.   Physical Exam Updated Vital Signs BP 137/80 (BP Location: Right Arm)   Pulse 79   Temp 98.1 F (36.7 C) (Oral)   Resp 16   SpO2 96%   Physical Exam Vitals and nursing note reviewed.  Constitutional:      General: He is not in acute distress.    Appearance: He is well-developed. He is not ill-appearing, toxic-appearing or diaphoretic.  HENT:     Head: Normocephalic and atraumatic.     Right Ear: External ear normal.     Left Ear: External ear normal.     Nose: No congestion or rhinorrhea.  Eyes:     Conjunctiva/sclera: Conjunctivae normal.     Pupils: Pupils are equal, round, and reactive to light.  Neck:     Trachea: Phonation normal.  Cardiovascular:     Rate and Rhythm: Normal rate and regular rhythm.     Heart sounds: Normal heart sounds.  Pulmonary:     Effort: Pulmonary effort is normal.     Comments: Scattered rhonchi no wheezes or rails.  No increased work of breathing. Abdominal:     General: There is no distension.  Musculoskeletal:        General: Swelling (1-2+ lower leg swelling, right greater than left.) present. No tenderness. Normal range of motion.     Cervical back: Normal range of motion  and neck supple.  Skin:    General: Skin is warm and dry.  Neurological:     Mental Status: He is alert and oriented to person, place, and time.     Cranial Nerves: No cranial nerve deficit.     Sensory: No sensory deficit.     Motor: No abnormal muscle tone.     Coordination: Coordination normal.  Psychiatric:        Mood and Affect: Mood normal.        Behavior: Behavior normal.        Thought  Content: Thought content normal.        Judgment: Judgment normal.     ED Results / Procedures / Treatments   Labs (all labs ordered are listed, but only abnormal results are displayed) Labs Reviewed - No data to display  EKG None  Radiology No results found.  Procedures Procedures   Medications Ordered in ED Medications  ketorolac (TORADOL) 30 MG/ML injection 30 mg (has no administration in time range)    ED Course  I have reviewed the triage vital signs and the nursing notes.  Pertinent labs & imaging results that were available during my care of the patient were reviewed by me and considered in my medical decision making (see chart for details).    MDM Rules/Calculators/A&P                           Patient Vitals for the past 24 hrs:  BP Temp Temp src Pulse Resp SpO2  12/17/20 1716 137/80 98.1 F (36.7 C) Oral 79 16 96 %    11:00 PM Reevaluation with update and discussion. After initial assessment and treatment, an updated evaluation reveals he is resting comfortably after Toradol injection.  Findings discussed and questions answered. Mancel Bale   Medical Decision Making:  This patient is presenting for evaluation of he presents for evaluation of shoulder pain, requesting narcotics, and bilateral lower extremity swelling, ongoing for several weeks., which does require a range of treatment options, and is a complaint that involves a moderate risk of morbidity and mortality. The differential diagnoses include shoulder arthritis, congestive heart failure,  complications from chronic disease. I decided to review old records, and in summary middle-aged male presenting with nonspecific symptoms requiring ED evaluation.  He is already been evaluated for shoulder pain  today by his neurosurgeon who was ordered an MRI of the right shoulder.  I did not require additional historical information from anyone.  Clinical Laboratory Tests Ordered, included CBC, Metabolic panel, Urinalysis and UDS. Review indicates normal except sodium low, hemoglobin low, total protein low. Radiologic Tests Ordered, included chest x-ray.  I independently Visualized: Radiographic images, which show no infiltrate or edema    Critical Interventions-clinical evaluation, laboratory testing, chest x-ray, observation and reassess  After These Interventions, the Patient was reevaluated and was found stable for discharge.  Peripheral edema likely secondary to low protein and relative inactivity.  Shoulder pain is nonspecific and is currently being evaluated with MRI imaging upcoming.  CRITICAL CARE-no Performed by: Mancel Bale  Nursing Notes Reviewed/ Care Coordinated Applicable Imaging Reviewed Interpretation of Laboratory Data incorporated into ED treatment  The patient appears reasonably screened and/or stabilized for discharge and I doubt any other medical condition or other [facility] requiring further screening, evaluation, or treatment in the ED at this time prior to discharge.  Plan: Home Medications-continue current, use ibuprofen for shoulder pain; Home Treatments-low-salt and high-protein diet; return here if the recommended treatment, does not improve the symptoms; Recommended follow up-PCP, PRN     Final Clinical Impression(s) / ED Diagnoses Final diagnoses:  Peripheral edema  Acute pain of right shoulder    Rx / DC Orders ED Discharge Orders    None       Mancel Bale, MD 12/17/20 2309

## 2020-12-17 NOTE — ED Notes (Signed)
Pt standing in room, directed pt into bed, pt c/o swelling to bilateral lower extremities.  Pt has plus two edema to lower extremities, less than three sec cap refill. Positive sensation to touch.

## 2020-12-17 NOTE — ED Triage Notes (Signed)
C/o pain in operative site and swelling feet

## 2020-12-17 NOTE — ED Notes (Signed)
Pt in bed with eyes closed, resps even and unlabored.  

## 2020-12-24 DIAGNOSIS — M25511 Pain in right shoulder: Secondary | ICD-10-CM | POA: Diagnosis not present

## 2020-12-24 DIAGNOSIS — R600 Localized edema: Secondary | ICD-10-CM | POA: Diagnosis not present

## 2020-12-26 DIAGNOSIS — F17210 Nicotine dependence, cigarettes, uncomplicated: Secondary | ICD-10-CM | POA: Diagnosis not present

## 2020-12-26 DIAGNOSIS — Z9889 Other specified postprocedural states: Secondary | ICD-10-CM | POA: Diagnosis not present

## 2020-12-26 DIAGNOSIS — Z79899 Other long term (current) drug therapy: Secondary | ICD-10-CM | POA: Diagnosis not present

## 2020-12-26 DIAGNOSIS — G545 Neuralgic amyotrophy: Secondary | ICD-10-CM | POA: Diagnosis not present

## 2020-12-26 DIAGNOSIS — M542 Cervicalgia: Secondary | ICD-10-CM | POA: Diagnosis not present

## 2020-12-26 DIAGNOSIS — G8928 Other chronic postprocedural pain: Secondary | ICD-10-CM | POA: Diagnosis not present

## 2021-01-09 DIAGNOSIS — R208 Other disturbances of skin sensation: Secondary | ICD-10-CM | POA: Diagnosis not present

## 2021-01-09 DIAGNOSIS — M799 Soft tissue disorder, unspecified: Secondary | ICD-10-CM | POA: Diagnosis not present

## 2021-01-09 DIAGNOSIS — M25511 Pain in right shoulder: Secondary | ICD-10-CM | POA: Diagnosis not present

## 2021-01-09 DIAGNOSIS — M25611 Stiffness of right shoulder, not elsewhere classified: Secondary | ICD-10-CM | POA: Diagnosis not present

## 2021-01-09 DIAGNOSIS — M6281 Muscle weakness (generalized): Secondary | ICD-10-CM | POA: Diagnosis not present

## 2021-01-13 DIAGNOSIS — M542 Cervicalgia: Secondary | ICD-10-CM | POA: Diagnosis not present

## 2021-01-13 DIAGNOSIS — Z9889 Other specified postprocedural states: Secondary | ICD-10-CM | POA: Diagnosis not present

## 2021-01-13 DIAGNOSIS — G8928 Other chronic postprocedural pain: Secondary | ICD-10-CM | POA: Diagnosis not present

## 2021-01-13 DIAGNOSIS — G545 Neuralgic amyotrophy: Secondary | ICD-10-CM | POA: Diagnosis not present

## 2021-01-13 DIAGNOSIS — F17210 Nicotine dependence, cigarettes, uncomplicated: Secondary | ICD-10-CM | POA: Diagnosis not present

## 2021-01-30 DIAGNOSIS — G8928 Other chronic postprocedural pain: Secondary | ICD-10-CM | POA: Diagnosis not present

## 2021-01-30 DIAGNOSIS — Z9889 Other specified postprocedural states: Secondary | ICD-10-CM | POA: Diagnosis not present

## 2021-01-30 DIAGNOSIS — G545 Neuralgic amyotrophy: Secondary | ICD-10-CM | POA: Diagnosis not present

## 2021-01-30 DIAGNOSIS — F17210 Nicotine dependence, cigarettes, uncomplicated: Secondary | ICD-10-CM | POA: Diagnosis not present

## 2021-01-30 DIAGNOSIS — M542 Cervicalgia: Secondary | ICD-10-CM | POA: Diagnosis not present

## 2021-01-30 DIAGNOSIS — Z79899 Other long term (current) drug therapy: Secondary | ICD-10-CM | POA: Diagnosis not present

## 2021-02-20 DIAGNOSIS — R030 Elevated blood-pressure reading, without diagnosis of hypertension: Secondary | ICD-10-CM | POA: Diagnosis not present

## 2021-02-20 DIAGNOSIS — M25511 Pain in right shoulder: Secondary | ICD-10-CM | POA: Diagnosis not present

## 2021-03-13 DIAGNOSIS — G8928 Other chronic postprocedural pain: Secondary | ICD-10-CM | POA: Diagnosis not present

## 2021-03-13 DIAGNOSIS — Z9889 Other specified postprocedural states: Secondary | ICD-10-CM | POA: Diagnosis not present

## 2021-03-13 DIAGNOSIS — Z79899 Other long term (current) drug therapy: Secondary | ICD-10-CM | POA: Diagnosis not present

## 2021-03-13 DIAGNOSIS — M542 Cervicalgia: Secondary | ICD-10-CM | POA: Diagnosis not present

## 2021-03-13 DIAGNOSIS — G545 Neuralgic amyotrophy: Secondary | ICD-10-CM | POA: Diagnosis not present

## 2021-03-13 DIAGNOSIS — F17210 Nicotine dependence, cigarettes, uncomplicated: Secondary | ICD-10-CM | POA: Diagnosis not present

## 2021-04-18 DIAGNOSIS — Z79899 Other long term (current) drug therapy: Secondary | ICD-10-CM | POA: Diagnosis not present

## 2021-04-18 DIAGNOSIS — G8928 Other chronic postprocedural pain: Secondary | ICD-10-CM | POA: Diagnosis not present

## 2021-04-18 DIAGNOSIS — Z23 Encounter for immunization: Secondary | ICD-10-CM | POA: Diagnosis not present

## 2021-04-18 DIAGNOSIS — F17210 Nicotine dependence, cigarettes, uncomplicated: Secondary | ICD-10-CM | POA: Diagnosis not present

## 2021-04-18 DIAGNOSIS — M542 Cervicalgia: Secondary | ICD-10-CM | POA: Diagnosis not present

## 2021-04-18 DIAGNOSIS — Z9889 Other specified postprocedural states: Secondary | ICD-10-CM | POA: Diagnosis not present

## 2021-04-18 DIAGNOSIS — G545 Neuralgic amyotrophy: Secondary | ICD-10-CM | POA: Diagnosis not present

## 2021-04-23 DIAGNOSIS — G8929 Other chronic pain: Secondary | ICD-10-CM | POA: Diagnosis not present

## 2021-04-23 DIAGNOSIS — E559 Vitamin D deficiency, unspecified: Secondary | ICD-10-CM | POA: Diagnosis not present

## 2021-04-23 DIAGNOSIS — E785 Hyperlipidemia, unspecified: Secondary | ICD-10-CM | POA: Diagnosis not present

## 2021-04-23 DIAGNOSIS — I10 Essential (primary) hypertension: Secondary | ICD-10-CM | POA: Diagnosis not present

## 2021-04-23 DIAGNOSIS — Z122 Encounter for screening for malignant neoplasm of respiratory organs: Secondary | ICD-10-CM | POA: Diagnosis not present

## 2021-04-23 DIAGNOSIS — Z0000 Encounter for general adult medical examination without abnormal findings: Secondary | ICD-10-CM | POA: Diagnosis not present

## 2021-04-23 DIAGNOSIS — Z79899 Other long term (current) drug therapy: Secondary | ICD-10-CM | POA: Diagnosis not present

## 2021-04-23 DIAGNOSIS — Z1211 Encounter for screening for malignant neoplasm of colon: Secondary | ICD-10-CM | POA: Diagnosis not present

## 2021-06-06 DIAGNOSIS — Z9889 Other specified postprocedural states: Secondary | ICD-10-CM | POA: Diagnosis not present

## 2021-06-06 DIAGNOSIS — M542 Cervicalgia: Secondary | ICD-10-CM | POA: Diagnosis not present

## 2021-06-06 DIAGNOSIS — G8928 Other chronic postprocedural pain: Secondary | ICD-10-CM | POA: Diagnosis not present

## 2021-06-06 DIAGNOSIS — F17210 Nicotine dependence, cigarettes, uncomplicated: Secondary | ICD-10-CM | POA: Diagnosis not present

## 2021-06-06 DIAGNOSIS — G545 Neuralgic amyotrophy: Secondary | ICD-10-CM | POA: Diagnosis not present

## 2021-06-06 DIAGNOSIS — E559 Vitamin D deficiency, unspecified: Secondary | ICD-10-CM | POA: Diagnosis not present

## 2021-06-06 DIAGNOSIS — M129 Arthropathy, unspecified: Secondary | ICD-10-CM | POA: Diagnosis not present

## 2021-06-06 DIAGNOSIS — Z79899 Other long term (current) drug therapy: Secondary | ICD-10-CM | POA: Diagnosis not present

## 2021-06-06 DIAGNOSIS — Z1159 Encounter for screening for other viral diseases: Secondary | ICD-10-CM | POA: Diagnosis not present

## 2021-07-04 DIAGNOSIS — Z79899 Other long term (current) drug therapy: Secondary | ICD-10-CM | POA: Diagnosis not present

## 2021-07-04 DIAGNOSIS — G8928 Other chronic postprocedural pain: Secondary | ICD-10-CM | POA: Diagnosis not present

## 2021-07-04 DIAGNOSIS — G545 Neuralgic amyotrophy: Secondary | ICD-10-CM | POA: Diagnosis not present

## 2021-07-04 DIAGNOSIS — Z9889 Other specified postprocedural states: Secondary | ICD-10-CM | POA: Diagnosis not present

## 2021-07-04 DIAGNOSIS — F17210 Nicotine dependence, cigarettes, uncomplicated: Secondary | ICD-10-CM | POA: Diagnosis not present

## 2021-07-04 DIAGNOSIS — M542 Cervicalgia: Secondary | ICD-10-CM | POA: Diagnosis not present

## 2021-08-01 DIAGNOSIS — Z0000 Encounter for general adult medical examination without abnormal findings: Secondary | ICD-10-CM | POA: Diagnosis not present

## 2021-08-01 DIAGNOSIS — M542 Cervicalgia: Secondary | ICD-10-CM | POA: Diagnosis not present

## 2021-08-01 DIAGNOSIS — R030 Elevated blood-pressure reading, without diagnosis of hypertension: Secondary | ICD-10-CM | POA: Diagnosis not present

## 2021-08-01 DIAGNOSIS — Z79899 Other long term (current) drug therapy: Secondary | ICD-10-CM | POA: Diagnosis not present

## 2021-08-01 DIAGNOSIS — Z9889 Other specified postprocedural states: Secondary | ICD-10-CM | POA: Diagnosis not present

## 2021-08-01 DIAGNOSIS — G8928 Other chronic postprocedural pain: Secondary | ICD-10-CM | POA: Diagnosis not present

## 2022-08-11 ENCOUNTER — Other Ambulatory Visit: Payer: Self-pay

## 2022-08-11 ENCOUNTER — Encounter (HOSPITAL_COMMUNITY): Payer: Self-pay

## 2022-08-11 ENCOUNTER — Emergency Department (HOSPITAL_COMMUNITY): Payer: Medicare Other

## 2022-08-11 ENCOUNTER — Emergency Department (HOSPITAL_COMMUNITY)
Admission: EM | Admit: 2022-08-11 | Discharge: 2022-08-12 | Disposition: A | Discharge status: Other Acute Inpatient Hospital | Payer: Medicare Other | Attending: Student | Admitting: Student

## 2022-08-11 DIAGNOSIS — M542 Cervicalgia: Secondary | ICD-10-CM | POA: Insufficient documentation

## 2022-08-11 DIAGNOSIS — D72819 Decreased white blood cell count, unspecified: Secondary | ICD-10-CM | POA: Diagnosis not present

## 2022-08-11 DIAGNOSIS — F17210 Nicotine dependence, cigarettes, uncomplicated: Secondary | ICD-10-CM | POA: Diagnosis not present

## 2022-08-11 DIAGNOSIS — F1010 Alcohol abuse, uncomplicated: Secondary | ICD-10-CM | POA: Diagnosis not present

## 2022-08-11 DIAGNOSIS — Z1152 Encounter for screening for COVID-19: Secondary | ICD-10-CM | POA: Insufficient documentation

## 2022-08-11 DIAGNOSIS — F1994 Other psychoactive substance use, unspecified with psychoactive substance-induced mood disorder: Secondary | ICD-10-CM | POA: Insufficient documentation

## 2022-08-11 DIAGNOSIS — D696 Thrombocytopenia, unspecified: Secondary | ICD-10-CM | POA: Diagnosis not present

## 2022-08-11 DIAGNOSIS — R45851 Suicidal ideations: Secondary | ICD-10-CM | POA: Insufficient documentation

## 2022-08-11 DIAGNOSIS — G894 Chronic pain syndrome: Secondary | ICD-10-CM | POA: Diagnosis not present

## 2022-08-11 LAB — SALICYLATE LEVEL: Salicylate Lvl: <7 mg/dL — ABNORMAL LOW (ref 7.0–30.0)

## 2022-08-11 LAB — COMPREHENSIVE METABOLIC PANEL
ALT: 71 U/L — ABNORMAL HIGH (ref 0–44)
AST: 67 U/L — ABNORMAL HIGH (ref 15–41)
Albumin: 3.3 g/dL — ABNORMAL LOW (ref 3.5–5.0)
Alkaline Phosphatase: 76 U/L (ref 38–126)
Anion gap: 12 (ref 5–15)
BUN: 45 mg/dL — ABNORMAL HIGH (ref 6–20)
CO2: 24 mmol/L (ref 22–32)
Calcium: 8.8 mg/dL — ABNORMAL LOW (ref 8.9–10.3)
Chloride: 105 mmol/L (ref 98–111)
Creatinine, Ser: 1.65 mg/dL — ABNORMAL HIGH (ref 0.61–1.24)
GFR, Estimated: 50 mL/min — ABNORMAL LOW (ref >60)
Glucose, Bld: 192 mg/dL — ABNORMAL HIGH (ref 70–99)
Potassium: 4.3 mmol/L (ref 3.5–5.1)
Sodium: 141 mmol/L (ref 135–145)
Total Bilirubin: 1.6 mg/dL — ABNORMAL HIGH (ref 0.3–1.2)
Total Protein: 6.3 g/dL — ABNORMAL LOW (ref 6.5–8.1)

## 2022-08-11 LAB — RAPID URINE DRUG SCREEN, HOSP PERFORMED
Amphetamines: NOT DETECTED
Barbiturates: NOT DETECTED
Benzodiazepines: NOT DETECTED
Cocaine: NOT DETECTED
Opiates: NOT DETECTED
Tetrahydrocannabinol: NOT DETECTED

## 2022-08-11 LAB — CBC
HCT: 46.3 % (ref 39.0–52.0)
Hemoglobin: 16 g/dL (ref 13.0–17.0)
MCH: 32.3 pg (ref 26.0–34.0)
MCHC: 34.6 g/dL (ref 30.0–36.0)
MCV: 93.5 fL (ref 80.0–100.0)
Platelets: 114 10*3/uL — ABNORMAL LOW (ref 150–400)
RBC: 4.95 MIL/uL (ref 4.22–5.81)
RDW: 12 % (ref 11.5–15.5)
WBC: 2.9 10*3/uL — ABNORMAL LOW (ref 4.0–10.5)
nRBC: 0 % (ref 0.0–0.2)

## 2022-08-11 LAB — BASIC METABOLIC PANEL
Anion gap: 11 (ref 5–15)
BUN: 5 mg/dL — ABNORMAL LOW (ref 6–20)
CO2: 28 mmol/L (ref 22–32)
Calcium: 8.1 mg/dL — ABNORMAL LOW (ref 8.9–10.3)
Chloride: 96 mmol/L — ABNORMAL LOW (ref 98–111)
Creatinine, Ser: 0.81 mg/dL (ref 0.61–1.24)
GFR, Estimated: >60 mL/min (ref >60)
Glucose, Bld: 103 mg/dL — ABNORMAL HIGH (ref 70–99)
Potassium: 3.8 mmol/L (ref 3.5–5.1)
Sodium: 135 mmol/L (ref 135–145)

## 2022-08-11 LAB — ETHANOL: Alcohol, Ethyl (B): <10 mg/dL (ref <10)

## 2022-08-11 LAB — ACETAMINOPHEN LEVEL: Acetaminophen (Tylenol), Serum: <10 ug/mL — ABNORMAL LOW (ref 10–30)

## 2022-08-11 MED ORDER — LACTATED RINGERS IV BOLUS
1000.0000 mL | Freq: Once | INTRAVENOUS | Status: AC
  Administered 2022-08-11: 1000 mL via INTRAVENOUS

## 2022-08-11 MED ORDER — ONDANSETRON HCL 4 MG/2ML IJ SOLN
4.0000 mg | Freq: Once | INTRAMUSCULAR | Status: AC
  Administered 2022-08-11: 4 mg via INTRAVENOUS
  Filled 2022-08-11: qty 2

## 2022-08-11 MED ORDER — LORAZEPAM 1 MG PO TABS
1.0000 mg | ORAL_TABLET | Freq: Four times a day (QID) | ORAL | Status: DC | PRN
  Administered 2022-08-11: 1 mg via ORAL
  Filled 2022-08-11: qty 1

## 2022-08-11 MED ORDER — OXYCODONE HCL 5 MG PO TABS
10.0000 mg | ORAL_TABLET | Freq: Four times a day (QID) | ORAL | Status: DC | PRN
  Administered 2022-08-11: 10 mg via ORAL
  Filled 2022-08-11: qty 2

## 2022-08-11 MED ORDER — ONDANSETRON 4 MG PO TBDP: 4.0000 mg | ORAL_TABLET | Freq: Three times a day (TID) | ORAL | Status: DC | PRN
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
## 2022-08-11 NOTE — ED Notes (Signed)
EDP notified of O2 sat and pt placed on 2L per [place]

## 2022-08-11 NOTE — ED Triage Notes (Signed)
Pt came in verbalizing he is SI , drinking 30 beers ad day. Pt is depressed and having financial issues.

## 2022-08-11 NOTE — BH Assessment (Addendum)
@  2050, Clinician notified patient's nurse's Loma Sousa, NP and Ginger, NP), that this Clinician is ready to complete patient's TTS assessment. Requested nurses to set up the TTS assessment. Prior to initiating the tele assessment,  received an update that patient was "actively vomiting". His nurse requested this Clinician to give patient some time before initiating the TTS assessment.   Clinician requesting staff to notify TTS when patient is ready to be assessed.
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
## 2022-08-11 NOTE — ED Notes (Signed)
Pt's O2 sat dropped to 88% on RA.  Pt placed back on 1L [place].

## 2022-08-11 NOTE — ED Notes (Addendum)
Pt talked to this RN complaining of shaking and feeling anxious. EDP made aware and to order pt ativan PRN. Pt given gingerale and calm and comfortable at this time.

## 2022-08-11 NOTE — ED Provider Notes (Signed)
[facility] EMERGENCY DEPARTMENT Provider Note  CSN: [id number] Arrival date & time: 08/11/22 2993  Chief Complaint(s) Suicidal  HPI Frank Page is a 52 y.o. male with Hx alcohol abuse, alcohol withdrawal, opioid abuse, previous neck surgery who presents emergency department for evaluation of suicidal ideation.  Patient was brought by his friend who states that the patient has been spiraling for the last 3 months.  He states that the patient has been drinking heavily with greater than 15 beers daily for the last 3 months and yesterday he fell off of his porch.  This morning, the patient reportedly was telling the patient's friend that he wanted to kill himself and patient was brought to the ER for psychiatric evaluation.  He does endorse some neck pain and is unsure if this is worse than previous.  States that last drink was 2 hours prior to arrival and patient also took oxycodone prior to arrival.  Denies chest pain, abdominal pain, nausea, vomiting or other systemic symptoms.   Past Medical History Past Medical History:  Diagnosis Date   Alcohol abuse 09/05/2013   Alcohol withdrawal (HCC) 09/05/2013   History of alcohol withdrawal seizures   Anxiety    Chronic pain syndrome 09/05/2013   Dyspnea    Herniated disc    Nonunion of fracture    Psoriasis    Thrombocytopenia, unspecified (HCC) 09/05/2013   Patient Active Problem List   Diagnosis Date Noted   HNP (herniated nucleus pulposus), cervical 11/22/2020   Alcohol dependence with withdrawal with complication (HCC) 09/02/2014   Substance induced mood disorder (HCC) 09/02/2014   Suicidal ideation    Alcoholic hepatitis 09/06/2013   Pneumonia 09/05/2013   Alcohol abuse 09/05/2013   Transaminitis 09/05/2013   Thrombocytopenia, unspecified (HCC) 09/05/2013   Chronic pain syndrome 09/05/2013   Home Medication(s) Prior to Admission medications   Medication Sig Start Date End Date Taking? Authorizing Provider  oxyCODONE-acetaminophen  (PERCOCET) 7.5-325 MG tablet Take 1 tablet by mouth 3 (three) times daily as needed for moderate pain. 07/22/22  Yes [provider]  baclofen (LIORESAL) 20 MG tablet Take 20 mg by mouth 2 (two) times daily. Patient not taking: Reported on 08/11/2022 10/02/20   [provider]  HYDROcodone-acetaminophen (NORCO/VICODIN) 5-325 MG tablet Take by mouth. Patient not taking: Reported on 12/17/2020 12/13/20   [provider]  methocarbamol (ROBAXIN) 500 MG tablet Take 1 tablet (500 mg total) by mouth 2 (two) times daily as needed for muscle spasms. Patient not taking: Reported on 12/17/2020 09/28/20   Eber Hong, MD  methylPREDNISolone (MEDROL DOSEPAK) 4 MG TBPK tablet Taper over 6 days please Patient not taking: Reported on 11/01/2020 09/28/20   Eber Hong, MD  naproxen (NAPROSYN) 500 MG tablet Take 1 tablet (500 mg total) by mouth 2 (two) times daily with a meal. Patient not taking: Reported on 11/01/2020 09/28/20   Eber Hong, MD  oxyCODONE (ROXICODONE) 5 MG immediate release tablet Take 1 tablet (5 mg total) by mouth every 6 (six) hours as needed for moderate pain or severe pain. Patient not taking: Reported on 08/11/2022 12/06/20   Vanetta Mulders, MD  pantoprazole (PROTONIX) 40 MG tablet Take 1 tablet (40 mg total) by mouth daily. Patient not taking: Reported on 12/17/2020 07/12/20   Gilda Crease, MD  dicyclomine (BENTYL) 20 MG tablet Take 1 tablet (20 mg total) by mouth 3 (three) times daily before meals. 07/12/20 09/28/20  Gilda Crease, MD  DULoxetine (CYMBALTA) 30 MG capsule Take 1 capsule (30  mg total) by mouth daily. For depression 09/05/14 09/28/20  Rankin, Shuvon B, NP  gabapentin (NEURONTIN) 100 MG capsule Take 2 capsules (200 mg total) by mouth 2 (two) times daily. For agitation 09/05/14 09/28/20  Rankin, Shuvon B, NP  sucralfate (CARAFATE) 1 g tablet Take 1 tablet (1 g total) by mouth 4 (four) times daily -  with meals and at bedtime. 07/12/20 09/28/20   Gilda Crease, MD  traZODone (DESYREL) 150 MG tablet Take 1 tablet (150 mg total) by mouth at bedtime as needed for sleep. 09/05/14 09/28/20  Rankin, Rada Hay, NP                                                                                                                                    Past Surgical History Past Surgical History:  Procedure Laterality Date   ANTERIOR CERVICAL DECOMP/DISCECTOMY FUSION N/A 11/22/2020   Procedure: Cervical Five-Six Cervical Six-Seven Anterior Cervical Decompression/Discectomy/Fusion;  Surgeon: Coletta Memos, MD;  Location: [facility] OR;  Service: Neurosurgery;  Laterality: N/A;   HERNIA REPAIR     JOINT REPLACEMENT     ORIF FEMORAL SHAFT FRACTURE W/ PLATES AND SCREWS     Family History No family history on file.  Social History Social History   Tobacco Use   Smoking status: Every Day    Packs/day: 2.00    Years: 4.00    Total pack years: 8.00    Types: Cigarettes   Smokeless tobacco: Never  Vaping Use   Vaping Use: Never used  Substance Use Topics   Alcohol use: Yes    Alcohol/week: 168.0 standard drinks of alcohol    Types: 168 Cans of beer per week    Comment: heavily   Drug use: Yes    Types: Marijuana   Allergies Penicillins  Review of Systems Review of Systems  Musculoskeletal:  Positive for myalgias.  Psychiatric/Behavioral:  Positive for suicidal ideas.     Physical Exam Vital Signs  I have reviewed the triage vital signs BP (!) 141/83   Pulse 96   Temp 97.6 F (36.4 C) (Oral)   Resp 14   Ht 5\' 2"  (1.575 m)   Wt 61.2 kg   SpO2 96%   BMI 24.69 kg/m   Physical Exam Constitutional:      General: He is not in acute distress.    Appearance: Normal appearance.  HENT:     Head: Normocephalic and atraumatic.     Nose: No congestion or rhinorrhea.  Eyes:     General:        Right eye: No discharge.        Left eye: No discharge.     Extraocular Movements: Extraocular movements intact.     Pupils: Pupils are equal,  round, and reactive to light.  Cardiovascular:     Rate and Rhythm: Normal rate and regular rhythm.     Heart sounds: No murmur heard. Pulmonary:  Effort: No respiratory distress.     Breath sounds: No wheezing or rales.  Abdominal:     General: There is no distension.     Tenderness: There is no abdominal tenderness.  Musculoskeletal:        General: Tenderness present. Normal range of motion.     Cervical back: Normal range of motion.  Skin:    General: Skin is warm and dry.  Neurological:     General: No focal deficit present.     Mental Status: He is alert.     ED Results and Treatments Labs (all labs ordered are listed, but only abnormal results are displayed) Labs Reviewed  COMPREHENSIVE METABOLIC PANEL - Abnormal; Notable for the following components:      Result Value   Glucose, Bld 192 (*)    BUN 45 (*)    Creatinine, Ser 1.65 (*)    Calcium 8.8 (*)    Total Protein 6.3 (*)    Albumin 3.3 (*)    AST 67 (*)    ALT 71 (*)    Total Bilirubin 1.6 (*)    GFR, Estimated 50 (*)    All other components within normal limits  SALICYLATE LEVEL - Abnormal; Notable for the following components:   Salicylate Lvl <7.0 (*)    All other components within normal limits  ACETAMINOPHEN LEVEL - Abnormal; Notable for the following components:   Acetaminophen (Tylenol), Serum <10 (*)    All other components within normal limits  CBC - Abnormal; Notable for the following components:   WBC 2.9 (*)    Platelets 114 (*)    All other components within normal limits  ETHANOL  RAPID URINE DRUG SCREEN, HOSP PERFORMED                                                                                                                          Radiology CT Head Wo Contrast  Result Date: 08/11/2022 CLINICAL DATA:  Patient is status post fall. EXAM: CT HEAD WITHOUT CONTRAST CT CERVICAL SPINE WITHOUT CONTRAST TECHNIQUE: Multidetector CT imaging of the head and cervical spine was performed  following the standard protocol without intravenous contrast. Multiplanar CT image reconstructions of the cervical spine were also generated. RADIATION DOSE REDUCTION: This exam was performed according to the departmental dose-optimization program which includes automated exposure control, adjustment of the mA and/or kV according to patient size and/or use of iterative reconstruction technique. COMPARISON:  CT cervical spine Dec 06, 2020; brain CT Dec 06, 2020 FINDINGS: CT HEAD FINDINGS Brain: No evidence of acute infarction, hemorrhage, hydrocephalus, extra-axial collection or mass lesion/mass effect. Periventricular and subcortical white matter hypodensities compatible with chronic microvascular ischemic changes. Vascular: No hyperdense vessel or unexpected calcification. Skull: Intact. Sinuses/Orbits: Mild mucosal thickening involving the paranasal sinuses, predominately of the ethmoid air cells. No air-fluid level identified. Other: None CT CERVICAL SPINE FINDINGS Alignment: Normal anatomic alignment. Skull base and vertebrae: Skull base intact. Patient status post anterior cervical spinal  fusion C5-C7. The fusion hardware appears malaligned with the inferior aspect of the plate located approximately 5 mm anterior to the anterior margin of the C7 vertebral body. This appears similar to prior CT cervical spine Dec 06, 2020. In the interval there has been progressive remodeling of the inferior C5 endplate, superior C6 endplate, inferior C6 endplate and superior C7 endplate. Additionally there is progressed height loss of the C5-6 and C6-7 levels without evidence for fusion. No evidence for acute fracture. Soft tissues and spinal canal: No prevertebral fluid or swelling. No visible canal hematoma. Disc levels:  No acute fracture. Upper chest: Biapical scarring. Other: None IMPRESSION: 1. No acute intracranial abnormality. 2. No evidence for cervical spine fracture or subluxation. 3. Status post anterior cervical  spinal fusion C5-C7. The inferior aspect of the fusion hardware is located approximately 5 mm anterior to the anterior margin of the C7 vertebral body. This appears similar to prior CT cervical spine Dec 06, 2020. In the interval there has been progressive remodeling of the inferior C5, superior C6, inferior C6 and superior C7 levels. Additionally there is progressed height loss of the C5-6 and C6-7 levels without evidence for fusion. Findings are likely chronic in etiology. Electronically Signed   By: Annia Beltrew  Davis M.D.   On: 08/11/2022 12:02   CT Cervical Spine Wo Contrast  Result Date: 08/11/2022 CLINICAL DATA:  Patient is status post fall. EXAM: CT HEAD WITHOUT CONTRAST CT CERVICAL SPINE WITHOUT CONTRAST TECHNIQUE: Multidetector CT imaging of the head and cervical spine was performed following the standard protocol without intravenous contrast. Multiplanar CT image reconstructions of the cervical spine were also generated. RADIATION DOSE REDUCTION: This exam was performed according to the departmental dose-optimization program which includes automated exposure control, adjustment of the mA and/or kV according to patient size and/or use of iterative reconstruction technique. COMPARISON:  CT cervical spine Dec 06, 2020; brain CT Dec 06, 2020 FINDINGS: CT HEAD FINDINGS Brain: No evidence of acute infarction, hemorrhage, hydrocephalus, extra-axial collection or mass lesion/mass effect. Periventricular and subcortical white matter hypodensities compatible with chronic microvascular ischemic changes. Vascular: No hyperdense vessel or unexpected calcification. Skull: Intact. Sinuses/Orbits: Mild mucosal thickening involving the paranasal sinuses, predominately of the ethmoid air cells. No air-fluid level identified. Other: None CT CERVICAL SPINE FINDINGS Alignment: Normal anatomic alignment. Skull base and vertebrae: Skull base intact. Patient status post anterior cervical spinal fusion C5-C7. The fusion hardware appears  malaligned with the inferior aspect of the plate located approximately 5 mm anterior to the anterior margin of the C7 vertebral body. This appears similar to prior CT cervical spine Dec 06, 2020. In the interval there has been progressive remodeling of the inferior C5 endplate, superior C6 endplate, inferior C6 endplate and superior C7 endplate. Additionally there is progressed height loss of the C5-6 and C6-7 levels without evidence for fusion. No evidence for acute fracture. Soft tissues and spinal canal: No prevertebral fluid or swelling. No visible canal hematoma. Disc levels:  No acute fracture. Upper chest: Biapical scarring. Other: None IMPRESSION: 1. No acute intracranial abnormality. 2. No evidence for cervical spine fracture or subluxation. 3. Status post anterior cervical spinal fusion C5-C7. The inferior aspect of the fusion hardware is located approximately 5 mm anterior to the anterior margin of the C7 vertebral body. This appears similar to prior CT cervical spine Dec 06, 2020. In the interval there has been progressive remodeling of the inferior C5, superior C6, inferior C6 and superior C7 levels. Additionally there is progressed height loss  of the C5-6 and C6-7 levels without evidence for fusion. Findings are likely chronic in etiology. Electronically Signed   By: Annia Belt M.D.   On: 08/11/2022 12:02   DG Chest 2 View  Result Date: 08/11/2022 CLINICAL DATA:  Shortness of breath, fall. EXAM: CHEST - 2 VIEW COMPARISON:  Dec 17, 2020. FINDINGS: The heart size and mediastinal contours are within normal limits. Both lungs are clear. The visualized skeletal structures are unremarkable. IMPRESSION: No active cardiopulmonary disease. Electronically Signed   By: Lupita Raider M.D.   On: 08/11/2022 11:47    Pertinent labs & imaging results that were available during my care of the patient were reviewed by me and considered in my medical decision making (see MDM for details).  Medications Ordered in  ED Medications  lactated ringers bolus 1,000 mL (has no administration in time range)                                                                                                                                     Procedures .Critical Care  Performed by: Glendora Score, MD Authorized by: Glendora Score, MD   Critical care provider statement:    Critical care time (minutes):  30   Critical care was necessary to treat or prevent imminent or life-threatening deterioration of the following conditions:  Respiratory failure and dehydration   Critical care was time spent personally by me on the following activities:  Development of treatment plan with patient or surrogate, discussions with consultants, evaluation of patient's response to treatment, examination of patient, ordering and review of laboratory studies, ordering and review of radiographic studies, ordering and performing treatments and interventions, pulse oximetry, re-evaluation of patient's condition and review of old charts   (including critical care time)  Medical Decision Making / ED Course   This patient presents to the ED for concern of suicidal ideation, fall, this involves an extensive number of treatment options, and is a complaint that carries with it a high risk of complications and morbidity.  The differential diagnosis includes fracture, contusion, closed head injury, suicidal ideation, alcohol use disorder, polysubstance use  MDM: Patient seen emerged part for evaluation of suicidal ideation.  Physical exam with tenderness in the C-spine but is otherwise unremarkable.  Trauma imaging reassuringly negative.  Patient with a leukopenia to 2.9, thrombocytopenia to 114 likely secondary to his chronic alcohol use.  Creatinine increased to 1.65 with BUN also increased to 45 likely prerenal AKI in the setting of his alcohol use and he is also noted to have a transaminitis with AST 67, ALT 71, total bili 1.6 again likely  consistent with his alcohol use.  Patient was given 2 L lactated Ringer's and at time of signout, patient is pending repeat BMP to ensure resolution of his AKI.  Patient was briefly hypoxic here in the emergency department and was placed on 2 L nasal cannula but has metabolized  underlying substances, the oxygen was weaned off and he currently is saturating well on room air.  TTS consult placed as the patient is likely medically cleared if BMP improves.  Please see provider signout for continuation of workup.   Additional history obtained: -Additional history obtained from friend -External records from outside source obtained and reviewed including: Chart review including previous notes, labs, imaging, consultation notes   Lab Tests: -I ordered, reviewed, and interpreted labs.   The pertinent results include:   Labs Reviewed  COMPREHENSIVE METABOLIC PANEL - Abnormal; Notable for the following components:      Result Value   Glucose, Bld 192 (*)    BUN 45 (*)    Creatinine, Ser 1.65 (*)    Calcium 8.8 (*)    Total Protein 6.3 (*)    Albumin 3.3 (*)    AST 67 (*)    ALT 71 (*)    Total Bilirubin 1.6 (*)    GFR, Estimated 50 (*)    All other components within normal limits  SALICYLATE LEVEL - Abnormal; Notable for the following components:   Salicylate Lvl <5.4 (*)    All other components within normal limits  ACETAMINOPHEN LEVEL - Abnormal; Notable for the following components:   Acetaminophen (Tylenol), Serum <10 (*)    All other components within normal limits  CBC - Abnormal; Notable for the following components:   WBC 2.9 (*)    Platelets 114 (*)    All other components within normal limits  ETHANOL  RAPID URINE DRUG SCREEN, HOSP PERFORMED       Imaging Studies ordered: I ordered imaging studies including CT head, C-spine, chest x-ray I independently visualized and interpreted imaging. I agree with the radiologist interpretation   Medicines ordered and prescription  drug management: Meds ordered this encounter  Medications   lactated ringers bolus 1,000 mL    -I have reviewed the patients home medicines and have made adjustments as needed  Critical interventions IVF, supplemental oxygen  Consultations Obtained: I requested consultation with the [place] providers,  and discussed lab and imaging findings as well as pertinent plan - they recommend: Recommendations are pending   Cardiac Monitoring: The patient was maintained on a cardiac monitor.  I personally viewed and interpreted the cardiac monitored which showed an underlying rhythm of: NSR  Social Determinants of Health:  Factors impacting patients care include: Daily alcohol use, opioid abuse   Reevaluation: After the interventions noted above, I reevaluated the patient and found that they have :improved  Co morbidities that complicate the patient evaluation  Past Medical History:  Diagnosis Date   Alcohol abuse 09/05/2013   Alcohol withdrawal ([place]) 09/05/2013   History of alcohol withdrawal seizures   Anxiety    Chronic pain syndrome 09/05/2013   Dyspnea    Herniated disc    Nonunion of fracture    Psoriasis    Thrombocytopenia, unspecified ([place]) 09/05/2013      Dispostion: I considered admission for this patient, and disposition pending at time of signout.  Please see provider signout for continuation of workup.     Final Clinical Impression(s) / ED Diagnoses Final diagnoses:  None     @PCDICTATION @    Teressa Lower, MD 08/11/22 1935

## 2022-08-11 NOTE — ED Provider Notes (Addendum)
Patient received IV fluids for a little bit of the acute kidney injury mild.  Will treat check BMP.  At this point patient medically cleared enough for evaluation by TTS.  Consult placed.  Patient also has his chronic pain medicine reordered.  Chest x-ray without any acute findings CT head without any acute findings CT cervical spine without any acute findings.  Patient alert.  No evidence of any significant alcohol withdrawal at this time.  Patient is on CIWA protocol.   Fredia Sorrow, MD 08/11/22 1836  [facility] is planning reevaluation in the morning.  It appears that patient's suicidal concerns that he had expressed to's is now is now not significant according to behavioral health.  But they are going to recheck him in the morning.    Fredia Sorrow, MD 08/11/22 [phone]

## 2022-08-11 NOTE — ED Notes (Signed)
Pt about to be assessed by TTS and started having vomiting and complaining of being hot. Asked pt when he last drink was and he stated that it was sometime this morning. EDP made aware and awaiting orders for pt. TTS made aware and to assess pt once he is feeling better.

## 2022-08-11 NOTE — ED Notes (Signed)
Pt moved to room to have TTS completed

## 2022-08-12 ENCOUNTER — Other Ambulatory Visit (HOSPITAL_COMMUNITY)
Admission: EM | Admit: 2022-08-12 | Discharge: 2022-08-14 | Disposition: A | Discharge status: Home / Self Care | Payer: Medicare Other | Attending: Psychiatry | Admitting: Psychiatry

## 2022-08-12 DIAGNOSIS — F1010 Alcohol abuse, uncomplicated: Secondary | ICD-10-CM | POA: Diagnosis present

## 2022-08-12 DIAGNOSIS — Z1152 Encounter for screening for COVID-19: Secondary | ICD-10-CM | POA: Insufficient documentation

## 2022-08-12 DIAGNOSIS — R45851 Suicidal ideations: Secondary | ICD-10-CM | POA: Diagnosis not present

## 2022-08-12 LAB — RESP PANEL BY RT-PCR (RSV, FLU A&B, COVID)  RVPGX2
Influenza A by PCR: NEGATIVE
Influenza B by PCR: NEGATIVE
Resp Syncytial Virus by PCR: NEGATIVE
SARS Coronavirus 2 by RT PCR: NEGATIVE

## 2022-08-12 LAB — CBC WITH DIFFERENTIAL/PLATELET
Abs Immature Granulocytes: 0.02 10*3/uL (ref 0.00–0.07)
Basophils Absolute: 0 10*3/uL (ref 0.0–0.1)
Basophils Relative: 1 %
Eosinophils Absolute: 0.1 10*3/uL (ref 0.0–0.5)
Eosinophils Relative: 2 %
HCT: 42.7 % (ref 39.0–52.0)
Hemoglobin: 14.8 g/dL (ref 13.0–17.0)
Immature Granulocytes: 1 %
Lymphocytes Relative: 18 %
Lymphs Abs: 0.7 10*3/uL (ref 0.7–4.0)
MCH: 32.1 pg (ref 26.0–34.0)
MCHC: 34.7 g/dL (ref 30.0–36.0)
MCV: 92.6 fL (ref 80.0–100.0)
Monocytes Absolute: 0.3 10*3/uL (ref 0.1–1.0)
Monocytes Relative: 8 %
Neutro Abs: 2.8 10*3/uL (ref 1.7–7.7)
Neutrophils Relative %: 70 %
Platelets: 77 10*3/uL — ABNORMAL LOW (ref 150–400)
RBC: 4.61 MIL/uL (ref 4.22–5.81)
RDW: 11.7 % (ref 11.5–15.5)
WBC: 3.9 10*3/uL — ABNORMAL LOW (ref 4.0–10.5)
nRBC: 0 % (ref 0.0–0.2)

## 2022-08-12 MED ORDER — LORAZEPAM 1 MG PO TABS
2.0000 mg | ORAL_TABLET | Freq: Once | ORAL | Status: AC
  Administered 2022-08-12: 2 mg via ORAL
  Filled 2022-08-12: qty 2

## 2022-08-12 MED ORDER — NICOTINE 21 MG/24HR TD PT24
21.0000 mg | MEDICATED_PATCH | Freq: Every day | TRANSDERMAL | Status: DC
  Administered 2022-08-13 – 2022-08-14 (×2): 21 mg via TRANSDERMAL
  Filled 2022-08-12 (×2): qty 1

## 2022-08-12 MED ORDER — HYDROXYZINE HCL 10 MG PO TABS
10.0000 mg | ORAL_TABLET | Freq: Three times a day (TID) | ORAL | Status: DC | PRN
  Administered 2022-08-12 – 2022-08-13 (×2): 10 mg via ORAL
  Filled 2022-08-12 (×2): qty 1

## 2022-08-12 MED ORDER — ALUM & MAG HYDROXIDE-SIMETH 200-200-20 MG/5ML PO SUSP: 30.0000 mL | ORAL | Status: DC | PRN

## 2022-08-12 MED ORDER — MAGNESIUM HYDROXIDE 400 MG/5ML PO SUSP: 30.0000 mL | Freq: Every day | ORAL | Status: DC | PRN

## 2022-08-12 MED ORDER — LORAZEPAM 1 MG PO TABS
1.0000 mg | ORAL_TABLET | Freq: Four times a day (QID) | ORAL | Status: DC | PRN
  Administered 2022-08-13: 1 mg via ORAL
  Filled 2022-08-12: qty 1

## 2022-08-12 MED ORDER — TRAZODONE HCL 50 MG PO TABS
50.0000 mg | ORAL_TABLET | Freq: Every evening | ORAL | Status: DC | PRN
  Administered 2022-08-12 – 2022-08-13 (×2): 50 mg via ORAL
  Filled 2022-08-12 (×2): qty 1

## 2022-08-12 MED ORDER — OXYCODONE HCL 5 MG PO TABS
5.0000 mg | ORAL_TABLET | Freq: Three times a day (TID) | ORAL | Status: DC | PRN
  Administered 2022-08-12 – 2022-08-14 (×3): 5 mg via ORAL
  Filled 2022-08-12 (×3): qty 1

## 2022-08-12 MED ORDER — GABAPENTIN 300 MG PO CAPS
300.0000 mg | ORAL_CAPSULE | Freq: Three times a day (TID) | ORAL | Status: DC
  Administered 2022-08-12 – 2022-08-14 (×6): 300 mg via ORAL
  Filled 2022-08-12 (×6): qty 1

## 2022-08-12 MED ORDER — GABAPENTIN 100 MG PO CAPS
100.0000 mg | ORAL_CAPSULE | Freq: Once | ORAL | Status: AC
  Administered 2022-08-12: 100 mg via ORAL
  Filled 2022-08-12: qty 1

## 2022-08-12 MED ORDER — ACETAMINOPHEN 325 MG PO TABS
650.0000 mg | ORAL_TABLET | Freq: Four times a day (QID) | ORAL | Status: DC | PRN
  Administered 2022-08-14: 650 mg via ORAL
  Filled 2022-08-12: qty 2

## 2022-08-12 MED ORDER — LORAZEPAM 2 MG/ML IJ SOLN: 2.0000 mg | Freq: Once | INTRAMUSCULAR | Status: DC | PRN

## 2022-08-12 NOTE — ED Provider Notes (Addendum)
Emergency Medicine Observation Re-evaluation Note  Frank Page is a 52 y.o. male, seen on rounds today.  Pt initially presented to the ED for complaints of Suicidal and Alcohol Problem Currently, the patient is awake and alert.  Pt presented to the ED yesterday for SI and alcohol abuse.  He did have some AKI, but that is better after IVFs.  He does not show signs of w/dr.  He was seen by [facility] yesterday evening and it looks like IOP was recommended.  It looks like there is a plan for re-assessment this am.  Physical Exam  BP (!) 150/94 (BP Location: Left Arm)   Pulse 94   Temp 98.1 F (36.7 C) (Oral)   Resp 18   Ht 5\' 2"  (1.575 m)   Wt 61.2 kg   SpO2 96%   BMI 24.69 kg/m  Physical Exam General: awake and alert Cardiac: rr Lungs: clear Psych: calm  ED Course / MDM  EKG:   I have reviewed the labs performed to date as well as medications administered while in observation.  Recent changes in the last 24 hours include IVFs improved mild aki.  Plan  Current plan is for re-eval this am.    Isla Pence, MD 08/12/22 0749   Pt has been accepted to the facility based crisis center if covid neg.  Covid/flu/rsv neg.  Pt is stable for transfer.     Isla Pence, MD 08/12/22 1210

## 2022-08-12 NOTE — Consult Note (Cosign Needed Addendum)
Patient accepted to [facility] pending - Covid/ Rsv results  - please call report to RN's at [phone] - Patient to be accepted on [facility] services

## 2022-08-12 NOTE — ED Notes (Signed)
Pt is in the bed sleeping. Respirations are even and unlabored. No acute distress noted. Will continue to monitor for safety. 

## 2022-08-12 NOTE — ED Notes (Signed)
PHQ-9 completed and placed in basket.

## 2022-08-12 NOTE — ED Notes (Signed)
Patient observed/assessed in dining room sitting watching TV. Patient alert and oriented x 4. Affect is flat and eye contact is appropriate. Patient denies pain and anxiety. He denies A/V/H. He denies having any thoughts/plan of self harm and harm towards others. Fluid and snack offered. Patient states that appetite has been good throughout the day. Last BM was today 08/12/22. Verbalizes no further complaints at this time. Will continue to monitor and support.

## 2022-08-12 NOTE — ED Notes (Signed)
Patient in dayroom eating dinner. Patient voices no complaints or concerns at this time. Respirations equal and unlabored, skin warm and dry, NAD. No change in assessment or acuity. Q 15 minute safety checks remain in place.

## 2022-08-12 NOTE — ED Provider Notes (Signed)
Facility Based Crisis Admission H&P  Date: 08/12/22 Patient Name: Frank Page MRN: [id number] Chief Complaint: No chief complaint on file.     Diagnoses:  Final diagnoses:  Alcohol abuse    HPI:  Frank Page is a 52 year old male with a past psychiatric history of alcohol use disorder, one previous behavioral health admission in 2016, as well as reported history of alcohol withdrawal seizures and chronic pain treated with opioids.  He reports that he is housed and has disability income, living with his brother who is a Development worker, community in [place], [place].  The patient initially presented to [facility] emergency department on 1/9 reporting drinking large quantities of alcohol and having suicidal thoughts.  He was transferred to the facility based crisis based on reported desire for detox and residential rehab.  On assessment 1/10 the patient denies interest in residential rehab and equivocates on whether or not he wants to stop drinking.  When asked what problems alcohol is caused to his life, he declines to name any.  He denies significant depressive symptoms, reporting no anhedonia and appropriate sleep and appetite.  He denies experiencing suicidal thoughts since his presentation to the emergency department.  Will consider obtaining collateral information given the initial reports by the patient's friend that he has been "spiraling" for the past several months and made a suicidal statement.  Status: Voluntary, may need to consider IVC if the patient tries to leave AMA  PHQ 2-9:   [place] ED from 08/12/2022 in [facility] ED from 08/11/2022 in [facility] from 12/17/2020 in [place] No Risk High Risk No Risk        Total Time spent with patient: 30 minutes  Musculoskeletal  Strength & Muscle Tone: within normal limits Gait & Station: normal Patient leans: N/A  Psychiatric Specialty  Exam  Psychiatric Specialty Exam: Physical Exam Constitutional:      Appearance: the patient is not toxic-appearing.  Pulmonary:     Effort: Pulmonary effort is normal.  Neurological:     General: No focal deficit present.     Mental Status: the patient is alert and oriented to person, place, and time.   Review of Systems  Respiratory:  Negative for shortness of breath.   Cardiovascular:  Negative for chest pain.  Gastrointestinal:  Negative for abdominal pain, constipation, diarrhea, nausea and vomiting.  Neurological:  Negative for headaches.      BP (!) 177/94   Pulse 99   Temp 98.5 F (36.9 C) (Oral)   Resp 18   SpO2 94%   General Appearance: Fairly Groomed  Eye Contact:  Good  Speech:  Clear and Coherent  Volume:  Normal  Mood:  Euthymic  Affect:  Congruent  Thought Process:  Coherent  Orientation:  Full (Time, Place, and Person)  Thought Content: Logical   Suicidal Thoughts:  No  Homicidal Thoughts:  No  Memory:  Immediate;   Good  Judgement: poor  Insight: poor  Psychomotor Activity:  Normal  Concentration:  Concentration: Good  Recall:  Good  Fund of Knowledge: Good  Language: Good  Akathisia:  No  Handed:    AIMS (if indicated): not done  Assets:  Communication Skills Desire for Improvement Financial Resources/Insurance Housing Leisure Time Physical Health  ADL's:  Intact  Cognition: WNL  Sleep:  Fair     Blood pressure (!) 177/94, pulse 99, temperature 98.5 F (36.9 C), temperature source Oral, resp.  rate 18, SpO2 94 %. There is no height or weight on file to calculate BMI.  Past Psychiatric History: as above   Is the patient at risk to self? No  Has the patient been a risk to self in the past 6 months? No .    Has the patient been a risk to self within the distant past? No   Is the patient a risk to others? No   Has the patient been a risk to others in the past 6 months? No   Has the patient been a risk to others within the distant past?  No   Past Medical History:  Past Medical History:  Diagnosis Date   Alcohol abuse 09/05/2013   Alcohol withdrawal ([place]) 09/05/2013   History of alcohol withdrawal seizures   Anxiety    Chronic pain syndrome 09/05/2013   Dyspnea    Herniated disc    Nonunion of fracture    Psoriasis    Thrombocytopenia, unspecified ([place]) 09/05/2013    Past Surgical History:  Procedure Laterality Date   ANTERIOR CERVICAL DECOMP/DISCECTOMY FUSION N/A 11/22/2020   Procedure: Cervical Five-Six Cervical Six-Seven Anterior Cervical Decompression/Discectomy/Fusion;  Surgeon: Ashok Pall, MD;  Location: [place];  Service: Neurosurgery;  Laterality: N/A;   HERNIA REPAIR     JOINT REPLACEMENT     ORIF FEMORAL SHAFT FRACTURE W/ PLATES AND SCREWS      Family History: No family history on file.  Social History:  Social History   Socioeconomic History   Marital status: Divorced    Spouse name: Not on file   Number of children: Not on file   Years of education: Not on file   Highest education level: Not on file  Occupational History   Not on file  Tobacco Use   Smoking status: Every Day    Packs/day: 2.00    Years: 4.00    Total pack years: 8.00    Types: Cigarettes   Smokeless tobacco: Never  Vaping Use   Vaping Use: Never used  Substance and Sexual Activity   Alcohol use: Yes    Alcohol/week: 168.0 standard drinks of alcohol    Types: 168 Cans of beer per week    Comment: heavily   Drug use: Yes    Types: Marijuana   Sexual activity: Not on file  Other Topics Concern   Not on file  Social History Narrative   Not on file   Social Determinants of Health   Financial Resource Strain: Not on file  Food Insecurity: Not on file  Transportation Needs: Not on file  Physical Activity: Not on file  Stress: Not on file  Social Connections: Not on file  Intimate Partner Violence: Not on file    SDOH:  SDOH Screenings   Tobacco Use: High Risk (08/11/2022)    Last Labs:  Admission on 08/11/2022,  Discharged on 08/12/2022  Component Date Value Ref Range Status   Sodium 08/11/2022 141  135 - 145 mmol/L Final   Potassium 08/11/2022 4.3  3.5 - 5.1 mmol/L Final   Chloride 08/11/2022 105  98 - 111 mmol/L Final   CO2 08/11/2022 24  22 - 32 mmol/L Final   Glucose, Bld 08/11/2022 192 (H)  70 - 99 mg/dL Final   Glucose reference range applies only to samples taken after fasting for at least 8 hours.   BUN 08/11/2022 45 (H)  6 - 20 mg/dL Final   Creatinine, Ser 08/11/2022 1.65 (H)  0.61 - 1.24 mg/dL Final  Calcium 08/11/2022 8.8 (L)  8.9 - 10.3 mg/dL Final   Total Protein 05/19/5101 6.3 (L)  6.5 - 8.1 g/dL Final   Albumin 58/52/7782 3.3 (L)  3.5 - 5.0 g/dL Final   AST 42/35/3614 67 (H)  15 - 41 U/L Final   ALT 08/11/2022 71 (H)  0 - 44 U/L Final   Alkaline Phosphatase 08/11/2022 76  38 - 126 U/L Final   Total Bilirubin 08/11/2022 1.6 (H)  0.3 - 1.2 mg/dL Final   GFR, Estimated 08/11/2022 50 (L)  >60 mL/min Final   Comment: (NOTE) Calculated using the CKD-EPI Creatinine Equation (2021)    Anion gap 08/11/2022 12  5 - 15 Final   Performed at [facility], [address], [place], [place] [address]   Alcohol, Ethyl (B) 08/11/2022 <10  <10 mg/dL Final   Comment: (NOTE) Lowest detectable limit for serum alcohol is 10 mg/dL.  For medical purposes only. Performed at [facility], [address] [place], [place] [address]    Salicylate Lvl 08/11/2022 <7.0 (L)  7.0 - 30.0 mg/dL Final   Performed at [facility], [address], [place], [place] [address]   Acetaminophen (Tylenol), Serum 08/11/2022 <10 (L)  10 - 30 ug/mL Final   Comment: (NOTE) Therapeutic concentrations vary significantly. A range of 10-30 ug/mL  may be an effective concentration for many patients. However, some  are best treated at concentrations outside of this range. Acetaminophen concentrations >150 ug/mL at 4 hours after ingestion  and >50 ug/mL at 12 hours after ingestion are often associated with  toxic  reactions.  Performed at [facility], [address], [place], [place] [address]    Opiates 08/11/2022 NONE DETECTED  NONE DETECTED Final   Cocaine 08/11/2022 NONE DETECTED  NONE DETECTED Final   Benzodiazepines 08/11/2022 NONE DETECTED  NONE DETECTED Final   Amphetamines 08/11/2022 NONE DETECTED  NONE DETECTED Final   Tetrahydrocannabinol 08/11/2022 NONE DETECTED  NONE DETECTED Final   Barbiturates 08/11/2022 NONE DETECTED  NONE DETECTED Final   Comment: (NOTE) DRUG SCREEN FOR MEDICAL PURPOSES ONLY.  IF CONFIRMATION IS NEEDED FOR ANY PURPOSE, NOTIFY LAB WITHIN 5 DAYS.  LOWEST DETECTABLE LIMITS FOR URINE DRUG SCREEN Drug Class                     Cutoff (ng/mL) Amphetamine and metabolites    1000 Barbiturate and metabolites    200 Benzodiazepine                 200 Opiates and metabolites        300 Cocaine and metabolites        300 THC                            50 Performed at [facility], [address] [place], [place] [address]    WBC 08/11/2022 2.9 (L)  4.0 - 10.5 K/uL Final   RBC 08/11/2022 4.95  4.22 - 5.81 MIL/uL Final   Hemoglobin 08/11/2022 16.0  13.0 - 17.0 g/dL Final   HCT 09/98/3382 46.3  39.0 - 52.0 % Final   MCV 08/11/2022 93.5  80.0 - 100.0 fL Final   MCH 08/11/2022 32.3  26.0 - 34.0 pg Final   MCHC 08/11/2022 34.6  30.0 - 36.0 g/dL Final   RDW 50/53/9767 12.0  11.5 - 15.5 % Final   Platelets 08/11/2022 114 (L)  150 - 400 K/uL Final   Comment: SPECIMEN  CHECKED FOR CLOTS Immature Platelet Fraction may be clinically indicated, consider ordering this additional test [id number]    nRBC 08/11/2022 0.0  0.0 - 0.2 % Final   Performed at [facility], [address], [place], [place] [address]   Sodium 08/11/2022 135  135 - 145 mmol/L Final   Potassium 08/11/2022 3.8  3.5 - 5.1 mmol/L Final   Chloride 08/11/2022 96 (L)  98 - 111 mmol/L Final   CO2 08/11/2022 28  22 - 32 mmol/L Final   Glucose, Bld 08/11/2022 103 (H)  70 - 99 mg/dL Final   Glucose reference  range applies only to samples taken after fasting for at least 8 hours.   BUN 08/11/2022 5 (L)  6 - 20 mg/dL Final   Creatinine, Ser 08/11/2022 0.81  0.61 - 1.24 mg/dL Final   Calcium 34/19/3790 8.1 (L)  8.9 - 10.3 mg/dL Final   GFR, Estimated 08/11/2022 >60  >60 mL/min Final   Comment: (NOTE) Calculated using the CKD-EPI Creatinine Equation (2021)    Anion gap 08/11/2022 11  5 - 15 Final   Performed at [facility], [address], [place], [place] [address]   SARS Coronavirus 2 by RT PCR 08/12/2022 NEGATIVE  NEGATIVE Final   Comment: (NOTE) SARS-CoV-2 target nucleic acids are NOT DETECTED.  The SARS-CoV-2 RNA is generally detectable in upper respiratory specimens during the acute phase of infection. The lowest concentration of SARS-CoV-2 viral copies this assay can detect is 138 copies/mL. A negative result does not preclude SARS-Cov-2 infection and should not be used as the sole basis for treatment or other patient management decisions. A negative result may occur with  improper specimen collection/handling, submission of specimen other than nasopharyngeal swab, presence of viral mutation(s) within the areas targeted by this assay, and inadequate number of viral copies(<138 copies/mL). A negative result must be combined with clinical observations, patient history, and epidemiological information. The expected result is Negative.  Fact Sheet for Patients:  BloggerCourse.com  Fact Sheet for Healthcare Providers:  SeriousBroker.it  This test is no                          t yet approved or cleared by the [place] FDA and  has been authorized for detection and/or diagnosis of SARS-CoV-2 by FDA under an Emergency Use Authorization (EUA). This EUA will remain  in effect (meaning this test can be used) for the duration of the COVID-19 declaration under Section 564(b)(1) of the Act, 21 U.S.C.section 360bbb-3(b)(1), unless the  authorization is terminated  or revoked sooner.       Influenza A by PCR 08/12/2022 NEGATIVE  NEGATIVE Final   Influenza B by PCR 08/12/2022 NEGATIVE  NEGATIVE Final   Comment: (NOTE) The Xpert Xpress SARS-CoV-2/FLU/RSV plus assay is intended as an aid in the diagnosis of influenza from Nasopharyngeal swab specimens and should not be used as a sole basis for treatment. Nasal washings and aspirates are unacceptable for Xpert Xpress SARS-CoV-2/FLU/RSV testing.  Fact Sheet for Patients: BloggerCourse.com  Fact Sheet for Healthcare Providers: SeriousBroker.it  This test is not yet approved or cleared by the [place] FDA and has been authorized for detection and/or diagnosis of SARS-CoV-2 by FDA under an Emergency Use Authorization (EUA). This EUA will remain in effect (meaning this test can be used) for the duration of the COVID-19 declaration under Section 564(b)(1) of the Act, 21 U.S.C. section 360bbb-3(b)(1), unless the authorization is terminated or revoked.  Resp Syncytial Virus by PCR 08/12/2022 NEGATIVE  NEGATIVE Final   Comment: (NOTE) Fact Sheet for Patients: BloggerCourse.com  Fact Sheet for Healthcare Providers: SeriousBroker.it  This test is not yet approved or cleared by the [place] FDA and has been authorized for detection and/or diagnosis of SARS-CoV-2 by FDA under an Emergency Use Authorization (EUA). This EUA will remain in effect (meaning this test can be used) for the duration of the COVID-19 declaration under Section 564(b)(1) of the Act, 21 U.S.C. section 360bbb-3(b)(1), unless the authorization is terminated or revoked.  Performed at [facility], [address] [place], [place] [address]     Allergies: Penicillins  PTA Medications: (Not in a hospital admission)   Long Term Goals: Improvement in symptoms so as ready for  discharge  Short Term Goals: Patient will verbalize feelings in meetings with treatment team members., Patient will attend at least of 50% of the groups daily., Pt will complete the PHQ9 on admission, day 3 and discharge., Patient will participate in completing the [place] Suicide Severity Rating Scale, Patient will score a low risk of violence for 24 hours prior to discharge, and Patient will take medications as prescribed daily.  Medical Decision Making  Patient admitted to facility based crisis  Status: Voluntary, may need to consider IVC if the patient tries to leave AMA  Alcohol use disorder, reported history of withdrawal seizures - Patient denies experiencing withdrawal presently and states his seizures only occurred years ago.  He appears calm.  - Scheduled gabapentin to treat alcohol withdrawal and prevent seizure - Did not desire to add benzodiazepine to the patient's regimen - CIWA with as needed Ativan - IM Ativan as needed for seizure  Chronic pain, treated with opioids - Consistent fills in the PDMP - Start oxycodone 5 mg every 8 as needed  Medical management - Labs notable for platelets of 114, will recheck - Slightly elevated LFTs, patient declined hepatitis panel - Elevated T. bili and decreased albumin concerning for declining liver function  Dispo: Likely home, can follow-up with [facility] on an outpatient basis    Recommendations  Based on my evaluation the patient does not appear to have an emergency medical condition.  Carlyn Reichert, MD 08/12/22  3:26 PM

## 2022-08-12 NOTE — ED Notes (Signed)
Patient A&Ox4. Patient admitted to [facility] for alcohol use disorder. Patient denies SI/HI and AVH. Patient denies any physical complaints when asked. No acute distress noted. Support and encouragement provided. Routine safety checks conducted according to facility protocol. Encouraged patient to notify staff if thoughts of harm toward self or others arise. Patient verbalize understanding and agreement. Patient oriented to unit and meal given. Will continue to monitor for safety.

## 2022-08-13 ENCOUNTER — Encounter (HOSPITAL_COMMUNITY): Payer: Self-pay

## 2022-08-13 DIAGNOSIS — F1010 Alcohol abuse, uncomplicated: Secondary | ICD-10-CM | POA: Diagnosis not present

## 2022-08-13 MED ORDER — NAPHAZOLINE-GLYCERIN 0.012-0.25 % OP SOLN
1.0000 [drp] | Freq: Four times a day (QID) | OPHTHALMIC | Status: DC | PRN
  Administered 2022-08-13 (×2): 2 [drp] via OPHTHALMIC
  Filled 2022-08-13: qty 15

## 2022-08-13 MED ORDER — HYDROXYZINE HCL 25 MG PO TABS
50.0000 mg | ORAL_TABLET | Freq: Every evening | ORAL | Status: DC | PRN
  Administered 2022-08-13: 50 mg via ORAL
  Filled 2022-08-13: qty 2

## 2022-08-13 NOTE — ED Notes (Signed)
Pt approached nurses station requesting medication to help him sleep and for chronic pain. Writer informed pt that I couldn't give sleep medication until tonight but he could have medication for discomfort. Pt states, "I'm hurting bad right now". Pt rated pain 9/10. Pt given pain medication and medication for anxiety. No further complaints voiced. Will continue to monitor for safety.

## 2022-08-13 NOTE — Group Note (Signed)
Group Topic: Healthy Self Image and Positive Change  Group Date: 08/13/2022 Start Time: 0800 End Time: 0830 Facilitators: Mervyn Skeeters D, NT  Department: [facility]  Number of Participants: 6  Group Focus: communication Treatment Modality:  Psychoeducation Interventions utilized were support Purpose: improve communication skills  Name: Frank Page Date of Birth: 06/23/1971  MR: [id number]    Level of Participation: moderate Quality of Participation: attentive Interactions with others: gave feedback Mood/Affect: appropriate Triggers (if applicable): na Cognition: coherent/clear Progress: Moderate Response: na Plan: follow-up needed   Patients Problems:  Patient Active Problem List   Diagnosis Date Noted   HNP (herniated nucleus pulposus), cervical 11/22/2020   Alcohol dependence with withdrawal with complication ([place]) 70/48/8891   Substance induced mood disorder ([place]) 09/02/2014   Suicidal ideation    Alcoholic hepatitis 69/45/0388   Pneumonia 09/05/2013   Alcohol abuse 09/05/2013   Transaminitis 09/05/2013   Thrombocytopenia, unspecified ([place]) 09/05/2013   Chronic pain syndrome 09/05/2013

## 2022-08-13 NOTE — ED Notes (Signed)
Pt became tearful stating, "I don't think I can leave tomorrow. I feel like I want need something to drink now". Pt observed trembling with increased anxiety. Skin clammy upon touch. CIWA 9. Pt given prn relief medication for withdrawal sx. Pt currently sitting in dayroom talking with peers. Pt have positive outlook on recovery. Informed pt to notify staff with any needs. Will continue to monitor for safety.

## 2022-08-13 NOTE — Discharge Instructions (Addendum)
If you are having a mental health emergency, including suicidal thoughts, homicidal thoughts, or auditory or visual hallucinations, please seek help using one of the following. 1.  Come to the [facility] behavioral health urgent care located at [place], [place].  This facility performs crisis assessments for patients, can provide substance use treatment, and also offers outpatient therapy and medication management. 2. Call 911 3. Go to any emergency department    In case of an urgent emergency, you have the option of contacting the Mobile Crisis Unit with [facility] at [phone].

## 2022-08-13 NOTE — BH IP Treatment Plan (Addendum)
Interdisciplinary Treatment and Diagnostic Plan Update  08/13/2022 Time of Session: [place] MRN: [id number]  Diagnosis:  Final diagnoses:  Alcohol abuse     Current Medications:  Current Facility-Administered Medications  Medication Dose Route Frequency Provider Last Rate Last Admin   acetaminophen (TYLENOL) tablet 650 mg  650 mg Oral Q6H PRN Corky Sox, MD       alum & mag hydroxide-simeth (MAALOX/MYLANTA) 200-200-20 MG/5ML suspension 30 mL  30 mL Oral Q4H PRN Corky Sox, MD       gabapentin (NEURONTIN) capsule 300 mg  300 mg Oral TID Corky Sox, MD   300 mg at 08/13/22 0914   hydrOXYzine (ATARAX) tablet 10 mg  10 mg Oral TID PRN Corky Sox, MD   10 mg at 08/12/22 2112   LORazepam (ATIVAN) injection 2 mg  2 mg Intramuscular Once PRN Corky Sox, MD       LORazepam (ATIVAN) tablet 1 mg  1 mg Oral Q6H PRN Corky Sox, MD       magnesium hydroxide (MILK OF MAGNESIA) suspension 30 mL  30 mL Oral Daily PRN Corky Sox, MD       naphazoline-glycerin (CLEAR EYES REDNESS) ophth solution 1-2 drop  1-2 drop Both Eyes QID PRN Corky Sox, MD       nicotine (NICODERM CQ - dosed in mg/24 hours) patch 21 mg  21 mg Transdermal Q0600 Corky Sox, MD   21 mg at 08/13/22 0600   oxyCODONE (Oxy IR/ROXICODONE) immediate release tablet 5 mg  5 mg Oral Q8H PRN Corky Sox, MD   5 mg at 08/12/22 1752   traZODone (DESYREL) tablet 50 mg  50 mg Oral QHS PRN Corky Sox, MD   50 mg at 08/12/22 2112   Current Outpatient Medications  Medication Sig Dispense Refill   baclofen (LIORESAL) 20 MG tablet Take 20 mg by mouth 2 (two) times daily. (Patient not taking: Reported on 08/11/2022)     HYDROcodone-acetaminophen (NORCO/VICODIN) 5-325 MG tablet Take by mouth. (Patient not taking: Reported on 12/17/2020)     methocarbamol (ROBAXIN) 500 MG tablet Take 1 tablet (500 mg total) by mouth 2 (two) times daily as needed for muscle spasms. (Patient not taking:  Reported on 12/17/2020) 20 tablet 0   methylPREDNISolone (MEDROL DOSEPAK) 4 MG TBPK tablet Taper over 6 days please (Patient not taking: Reported on 11/01/2020) 21 tablet 0   naproxen (NAPROSYN) 500 MG tablet Take 1 tablet (500 mg total) by mouth 2 (two) times daily with a meal. (Patient not taking: Reported on 11/01/2020) 30 tablet 0   oxyCODONE (ROXICODONE) 5 MG immediate release tablet Take 1 tablet (5 mg total) by mouth every 6 (six) hours as needed for moderate pain or severe pain. (Patient not taking: Reported on 08/11/2022) 12 tablet 0   oxyCODONE-acetaminophen (PERCOCET) 7.5-325 MG tablet Take 1 tablet by mouth 3 (three) times daily as needed for moderate pain.     pantoprazole (PROTONIX) 40 MG tablet Take 1 tablet (40 mg total) by mouth daily. (Patient not taking: Reported on 12/17/2020) 30 tablet 3   PTA Medications: Prior to Admission medications   Medication Sig Start Date End Date Taking? Authorizing Provider  baclofen (LIORESAL) 20 MG tablet Take 20 mg by mouth 2 (two) times daily. Patient not taking: Reported on 08/11/2022 10/02/20   [provider]  HYDROcodone-acetaminophen (NORCO/VICODIN) 5-325 MG tablet Take by mouth. Patient not taking: Reported on 12/17/2020 12/13/20   [provider]  methocarbamol (ROBAXIN) 500 MG tablet Take 1  tablet (500 mg total) by mouth 2 (two) times daily as needed for muscle spasms. Patient not taking: Reported on 12/17/2020 09/28/20   Eber Hong, MD  methylPREDNISolone (MEDROL DOSEPAK) 4 MG TBPK tablet Taper over 6 days please Patient not taking: Reported on 11/01/2020 09/28/20   Eber Hong, MD  naproxen (NAPROSYN) 500 MG tablet Take 1 tablet (500 mg total) by mouth 2 (two) times daily with a meal. Patient not taking: Reported on 11/01/2020 09/28/20   Eber Hong, MD  oxyCODONE (ROXICODONE) 5 MG immediate release tablet Take 1 tablet (5 mg total) by mouth every 6 (six) hours as needed for moderate pain or severe pain. Patient not taking:  Reported on 08/11/2022 12/06/20   Vanetta Mulders, MD  oxyCODONE-acetaminophen (PERCOCET) 7.5-325 MG tablet Take 1 tablet by mouth 3 (three) times daily as needed for moderate pain. 07/22/22   [provider]  pantoprazole (PROTONIX) 40 MG tablet Take 1 tablet (40 mg total) by mouth daily. Patient not taking: Reported on 12/17/2020 07/12/20   Gilda Crease, MD  dicyclomine (BENTYL) 20 MG tablet Take 1 tablet (20 mg total) by mouth 3 (three) times daily before meals. 07/12/20 09/28/20  Gilda Crease, MD  DULoxetine (CYMBALTA) 30 MG capsule Take 1 capsule (30 mg total) by mouth daily. For depression 09/05/14 09/28/20  Rankin, Shuvon B, NP  gabapentin (NEURONTIN) 100 MG capsule Take 2 capsules (200 mg total) by mouth 2 (two) times daily. For agitation 09/05/14 09/28/20  Rankin, Shuvon B, NP  sucralfate (CARAFATE) 1 g tablet Take 1 tablet (1 g total) by mouth 4 (four) times daily -  with meals and at bedtime. 07/12/20 09/28/20  Gilda Crease, MD  traZODone (DESYREL) 150 MG tablet Take 1 tablet (150 mg total) by mouth at bedtime as needed for sleep. 09/05/14 09/28/20  Rankin, Rada Hay, NP    Patient Stressors: Financial difficulties   Health problems   Substance abuse    Patient Strengths: Average or above average intelligence  Capable of independent living  Communication skills  General fund of knowledge  Religious Affiliation  Special hobby/interest  Supportive family/friends   Treatment Modalities: Medication Management, Group therapy, Case management,  1 to 1 session with clinician, Psychoeducation, Recreational therapy.   Physician Treatment Plan for Primary and Secondary Diagnosis:  Final diagnoses:  Alcohol abuse   Long Term Goal(s): Improvement in symptoms so as ready for discharge  Short Term Goals: Patient will verbalize feelings in meetings with treatment team members. Patient will attend at least of 50% of the groups daily. Pt will complete the PHQ9 on  admission, day 3 and discharge. Patient will participate in completing the [place] Suicide Severity Rating Scale Patient will score a low risk of violence for 24 hours prior to discharge Patient will take medications as prescribed daily.  Medication Management: Evaluate patient's response, side effects, and tolerance of medication regimen.  Therapeutic Interventions: 1 to 1 sessions, Unit Group sessions and Medication administration.  Evaluation of Outcomes: Adequate for Discharge  LCSW Treatment Plan for Primary Diagnosis:  Final diagnoses:  Alcohol abuse    Long Term Goal(s): Safe transition to appropriate next level of care at discharge.  Short Term Goals: Facilitate acceptance of mental health diagnosis and concerns through verbal commitment to aftercare plan and appointments at discharge., Patient will identify one social support prior to discharge to aid in patient's recovery., Patient will attend AA/NA groups as scheduled., Identify minimum of 2 triggers associated with mental health/substance abuse issues with treatment  team members., and Increase skills for wellness and recovery by attending 50% of scheduled groups.  Therapeutic Interventions: Assess for all discharge needs, 1 to 1 time with Education officer, museum, Explore available resources and support systems, Assess for adequacy in community support network, Educate family and significant other(s) on suicide prevention, Complete Psychosocial Assessment, Interpersonal group therapy.  Evaluation of Outcomes: Adequate for Discharge   Progress in Treatment: Attending groups: Yes. Participating in groups: Yes. Taking medication as prescribed: Yes. Toleration medication: Yes Family/Significant other contact made: No, will contact:  Dr. Alvie Heidelberg will try to contact pt's daughter. Patient understands diagnosis: Yes. Discussing patient identified problems/goals with staff: Yes. Medical problems stabilized or resolved: Yes. Denies  suicidal/homicidal ideation: Yes. Issues/concerns per patient self-inventory: No. Other: Patient also appears to be minimizing his presenting issues, but still wants treatment.  New problem(s) identified: No, Describe:  n/a  New Short Term/Long Term Goal(s):  Patient Goals:  Discharge  Discharge Plan or Barriers: None at this time.  Reason for Continuation of Hospitalization: Depression and seeking substance use treatment; withdrawal symptoms.  Estimated Length of Stay:  1-5 days  Last 3 [place] Suicide Severity Risk Score: [place] [facility] from 08/12/2022 in [facility] ED from 08/11/2022 in [place] [facility] from 12/17/2020 in [place] No Risk High Risk No Risk       Last PHQ 2/9 Scores:    08/12/2022    9:48 AM  Depression screen PHQ 2/9  Decreased Interest 1  Down, Depressed, Hopeless 1  PHQ - 2 Score 2  Altered sleeping 2  Tired, decreased energy 2  Change in appetite 1  Feeling bad or failure about yourself  1  Trouble concentrating 1  Moving slowly or fidgety/restless 0  Suicidal thoughts 0  PHQ-9 Score 9  Difficult doing work/chores Very difficult    Scribe for Treatment Team: Kerri Perches, LCSW 08/13/2022 12:03 PM

## 2022-08-13 NOTE — ED Notes (Signed)
Patient A&Ox4. Denies intent to harm self/others when asked. Denies A/VH. Patient denies any physical complaints when asked. Pt states, "I had a hard time getting to sleep last night. I was up about every 15-30 min. I had crazy dreams but they were so vivid". Support and encouragement provided. Routine safety checks conducted according to facility protocol. Encouraged patient to notify staff if thoughts of harm toward self or others arise. Patient verbalize understanding and agreement. Will continue to monitor for safety.

## 2022-08-13 NOTE — ED Notes (Signed)
Patient observed/assessed at nursing station. Patient alert and oriented x 4. Affect is Flat. Patient denies pain and anxiety. He denies A/V/H. He denies having any thoughts/plan of self harm and harm towards others. Fluid and snack offered. Patient states that appetite has been good throughout the day. Last BM was 08/13/22. Verbalizes no further complaints at this time. Will continue to monitor and support.

## 2022-08-13 NOTE — ED Provider Notes (Signed)
Behavioral Health Progress Note  Date and Time: 08/13/2022 1:56 PM Name: Frank Page MRN:  [id number]  Subjective:   Frank Page is a 52 year old male with a past psychiatric history of alcohol use disorder, one previous behavioral health admission in 2016, as well as reported history of alcohol withdrawal seizures and chronic pain treated with opioids.  He reports that he is housed and has disability income, living with his brother who is a Nutritional therapist in [place], [place].  The patient initially presented to [facility] emergency department on 1/9 reporting drinking large quantities of alcohol and having suicidal thoughts.  He was transferred to the facility based crisis based on reported desire for detox and residential rehab.  On assessment 1/10 the patient denied interest in residential rehab and equivocated on whether or not he wants to stop drinking.  When asked what problems alcohol is caused to his life, he declines to name any.  He denies significant depressive symptoms, reporting no anhedonia and appropriate sleep and appetite.  He denies experiencing suicidal thoughts since his presentation to the emergency department.  Residential rehab was recommended for the patient, which he has declined.  Given documentation of the patient's friend reported that the patient had been "spiraling" for several months and the friend's report that the patient made a suicidal statement, we spoke with collateral with the patient's consent. Called patient's brother, Llewelyn Sheaffer, [phone].  Alinda Money confirms the social history as given by the patient, including the fact that he is employed and lives with the patient (for many years) and that the patient receives SSI.  He does feel like the patient has significant anxiety and depression that he states that the patient drinks to excess frequently.  He confirms that the patient has made suicidal statements before but he clarifies that this only occurs when the  patient is drunk.  He feels confident that his brother would never act in accordance with these's.  He denies any previous suicide attempts.  He states that the patient has no access to firearms at the house.  Discussed extensively how the patient can receive help should he experience a mental health crisis put this information into the AVS.  His brother states that he has no safety concerns regarding his brother coming home.  Discussed his mental health follow-up plan with [facility] in [place] and his brother states that he intends to hold the patient accountable.  Interview today with the patient is very similar to yesterday.  He lacks insight into his problematic alcohol use.  He denies experiencing significant depression.  He states that he will follow-up with [facility] in [place] to help with mental health.  Denies experiencing any withdrawal symptoms and states that he has no medication side effects.  He is noted to be euthymic throughout the interview and exhibits a future oriented thought process.    Diagnosis:  Final diagnoses:  Alcohol abuse    Total Time spent with patient: 20 minutes  Past Psychiatric History: as above Past Medical History:  Past Medical History:  Diagnosis Date   Alcohol abuse 09/05/2013   Alcohol withdrawal (HCC) 09/05/2013   History of alcohol withdrawal seizures   Anxiety    Chronic pain syndrome 09/05/2013   Dyspnea    Herniated disc    Nonunion of fracture    Psoriasis    Thrombocytopenia, unspecified (HCC) 09/05/2013    Past Surgical History:  Procedure Laterality Date   ANTERIOR CERVICAL DECOMP/DISCECTOMY FUSION N/A 11/22/2020   Procedure: Cervical Five-Six  Cervical Six-Seven Anterior Cervical Decompression/Discectomy/Fusion;  Surgeon: Coletta Memos, MD;  [facility];  Service: Neurosurgery;  Laterality: N/A;   HERNIA REPAIR     JOINT REPLACEMENT     ORIF FEMORAL SHAFT FRACTURE W/ PLATES AND SCREWS     Family History: No family history on  file. Family Psychiatric  History: none Social History:  Social History   Substance and Sexual Activity  Alcohol Use Yes   Alcohol/week: 168.0 standard drinks of alcohol   Types: 168 Cans of beer per week   Comment: heavily     Social History   Substance and Sexual Activity  Drug Use Yes   Types: Marijuana    Social History   Socioeconomic History   Marital status: Divorced    Spouse name: Not on file   Number of children: Not on file   Years of education: Not on file   Highest education level: Not on file  Occupational History   Not on file  Tobacco Use   Smoking status: Every Day    Packs/day: 2.00    Years: 4.00    Total pack years: 8.00    Types: Cigarettes   Smokeless tobacco: Never  Vaping Use   Vaping Use: Never used  Substance and Sexual Activity   Alcohol use: Yes    Alcohol/week: 168.0 standard drinks of alcohol    Types: 168 Cans of beer per week    Comment: heavily   Drug use: Yes    Types: Marijuana   Sexual activity: Not on file  Other Topics Concern   Not on file  Social History Narrative   Not on file   Social Determinants of Health   Financial Resource Strain: Not on file  Food Insecurity: Not on file  Transportation Needs: Not on file  Physical Activity: Not on file  Stress: Not on file  Social Connections: Not on file   SDOH:  SDOH Screenings   Tobacco Use: High Risk (08/11/2022)   Additional Social History:                         Sleep: Fair  Appetite:  Fair  Current Medications:  Current Facility-Administered Medications  Medication Dose Route Frequency Provider Last Rate Last Admin   acetaminophen (TYLENOL) tablet 650 mg  650 mg Oral Q6H PRN Carlyn Reichert, MD       alum & mag hydroxide-simeth (MAALOX/MYLANTA) 200-200-20 MG/5ML suspension 30 mL  30 mL Oral Q4H PRN Carlyn Reichert, MD       gabapentin (NEURONTIN) capsule 300 mg  300 mg Oral TID Carlyn Reichert, MD   300 mg at 08/13/22 0914   hydrOXYzine  (ATARAX) tablet 10 mg  10 mg Oral TID PRN Carlyn Reichert, MD   10 mg at 08/13/22 1253   LORazepam (ATIVAN) injection 2 mg  2 mg Intramuscular Once PRN Carlyn Reichert, MD       LORazepam (ATIVAN) tablet 1 mg  1 mg Oral Q6H PRN Carlyn Reichert, MD       magnesium hydroxide (MILK OF MAGNESIA) suspension 30 mL  30 mL Oral Daily PRN Carlyn Reichert, MD       naphazoline-glycerin (CLEAR EYES REDNESS) ophth solution 1-2 drop  1-2 drop Both Eyes QID PRN Carlyn Reichert, MD       nicotine (NICODERM CQ - dosed in mg/24 hours) patch 21 mg  21 mg Transdermal Q0600 Carlyn Reichert, MD   21 mg at 08/13/22 0600   oxyCODONE (  Oxy IR/ROXICODONE) immediate release tablet 5 mg  5 mg Oral Q8H PRN Carlyn Reichert, MD   5 mg at 08/13/22 1252   traZODone (DESYREL) tablet 50 mg  50 mg Oral QHS PRN Carlyn Reichert, MD   50 mg at 08/12/22 2112   Current Outpatient Medications  Medication Sig Dispense Refill   baclofen (LIORESAL) 20 MG tablet Take 20 mg by mouth 2 (two) times daily. (Patient not taking: Reported on 08/11/2022)     HYDROcodone-acetaminophen (NORCO/VICODIN) 5-325 MG tablet Take by mouth. (Patient not taking: Reported on 12/17/2020)     methocarbamol (ROBAXIN) 500 MG tablet Take 1 tablet (500 mg total) by mouth 2 (two) times daily as needed for muscle spasms. (Patient not taking: Reported on 12/17/2020) 20 tablet 0   methylPREDNISolone (MEDROL DOSEPAK) 4 MG TBPK tablet Taper over 6 days please (Patient not taking: Reported on 11/01/2020) 21 tablet 0   naproxen (NAPROSYN) 500 MG tablet Take 1 tablet (500 mg total) by mouth 2 (two) times daily with a meal. (Patient not taking: Reported on 11/01/2020) 30 tablet 0   oxyCODONE (ROXICODONE) 5 MG immediate release tablet Take 1 tablet (5 mg total) by mouth every 6 (six) hours as needed for moderate pain or severe pain. (Patient not taking: Reported on 08/11/2022) 12 tablet 0   oxyCODONE-acetaminophen (PERCOCET) 7.5-325 MG tablet Take 1 tablet by mouth 3 (three) times daily as  needed for moderate pain.     pantoprazole (PROTONIX) 40 MG tablet Take 1 tablet (40 mg total) by mouth daily. (Patient not taking: Reported on 12/17/2020) 30 tablet 3    Labs  Lab Results:  Admission on 08/12/2022  Component Date Value Ref Range Status   WBC 08/12/2022 3.9 (L)  4.0 - 10.5 K/uL Final   RBC 08/12/2022 4.61  4.22 - 5.81 MIL/uL Final   Hemoglobin 08/12/2022 14.8  13.0 - 17.0 g/dL Final   HCT 02/63/7858 42.7  39.0 - 52.0 % Final   MCV 08/12/2022 92.6  80.0 - 100.0 fL Final   MCH 08/12/2022 32.1  26.0 - 34.0 pg Final   MCHC 08/12/2022 34.7  30.0 - 36.0 g/dL Final   RDW 85/09/7739 11.7  11.5 - 15.5 % Final   Platelets 08/12/2022 77 (L)  150 - 400 K/uL Final   Comment: Immature Platelet Fraction may be clinically indicated, consider ordering this additional test [id number] REPEATED TO VERIFY    nRBC 08/12/2022 0.0  0.0 - 0.2 % Final   Neutrophils Relative % 08/12/2022 70  % Final   Neutro Abs 08/12/2022 2.8  1.7 - 7.7 K/uL Final   Lymphocytes Relative 08/12/2022 18  % Final   Lymphs Abs 08/12/2022 0.7  0.7 - 4.0 K/uL Final   Monocytes Relative 08/12/2022 8  % Final   Monocytes Absolute 08/12/2022 0.3  0.1 - 1.0 K/uL Final   Eosinophils Relative 08/12/2022 2  % Final   Eosinophils Absolute 08/12/2022 0.1  0.0 - 0.5 K/uL Final   Basophils Relative 08/12/2022 1  % Final   Basophils Absolute 08/12/2022 0.0  0.0 - 0.1 K/uL Final   Immature Granulocytes 08/12/2022 1  % Final   Abs Immature Granulocytes 08/12/2022 0.02  0.00 - 0.07 K/uL Final   Performed at [facility], [address], [place], [place] [address]  Admission on 08/11/2022, Discharged on 08/12/2022  Component Date Value Ref Range Status   Sodium 08/11/2022 141  135 - 145 mmol/L Final   Potassium 08/11/2022 4.3  3.5 - 5.1 mmol/L  Final   Chloride 08/11/2022 105  98 - 111 mmol/L Final   CO2 08/11/2022 24  22 - 32 mmol/L Final   Glucose, Bld 08/11/2022 192 (H)  70 - 99 mg/dL Final   Glucose reference range  applies only to samples taken after fasting for at least 8 hours.   BUN 08/11/2022 45 (H)  6 - 20 mg/dL Final   Creatinine, Ser 08/11/2022 1.65 (H)  0.61 - 1.24 mg/dL Final   Calcium 16/10/960401/04/2023 8.8 (L)  8.9 - 10.3 mg/dL Final   Total Protein 54/09/811901/04/2023 6.3 (L)  6.5 - 8.1 g/dL Final   Albumin 14/78/295601/04/2023 3.3 (L)  3.5 - 5.0 g/dL Final   AST 21/30/865701/04/2023 67 (H)  15 - 41 U/L Final   ALT 08/11/2022 71 (H)  0 - 44 U/L Final   Alkaline Phosphatase 08/11/2022 76  38 - 126 U/L Final   Total Bilirubin 08/11/2022 1.6 (H)  0.3 - 1.2 mg/dL Final   GFR, Estimated 08/11/2022 50 (L)  >60 mL/min Final   Comment: (NOTE) Calculated using the CKD-EPI Creatinine Equation (2021)    Anion gap 08/11/2022 12  5 - 15 Final   Performed at [facility], [address] [place], [place] [address]   Alcohol, Ethyl (B) 08/11/2022 <10  <10 mg/dL Final   Comment: (NOTE) Lowest detectable limit for serum alcohol is 10 mg/dL.  For medical purposes only. Performed at [facility], [address] [place], [place] [address]    Salicylate Lvl 08/11/2022 <7.0 (L)  7.0 - 30.0 mg/dL Final   Performed at [facility], [address] [place], [place] [address]   Acetaminophen (Tylenol), Serum 08/11/2022 <10 (L)  10 - 30 ug/mL Final   Comment: (NOTE) Therapeutic concentrations vary significantly. A range of 10-30 ug/mL  may be an effective concentration for many patients. However, some  are best treated at concentrations outside of this range. Acetaminophen concentrations >150 ug/mL at 4 hours after ingestion  and >50 ug/mL at 12 hours after ingestion are often associated with  toxic reactions.  Performed at [facility], [address] [place], [place] [address]    Opiates 08/11/2022 NONE DETECTED  NONE DETECTED Final   Cocaine 08/11/2022 NONE DETECTED  NONE DETECTED Final   Benzodiazepines 08/11/2022 NONE DETECTED  NONE DETECTED Final   Amphetamines 08/11/2022 NONE DETECTED  NONE DETECTED Final   Tetrahydrocannabinol  08/11/2022 NONE DETECTED  NONE DETECTED Final   Barbiturates 08/11/2022 NONE DETECTED  NONE DETECTED Final   Comment: (NOTE) DRUG SCREEN FOR MEDICAL PURPOSES ONLY.  IF CONFIRMATION IS NEEDED FOR ANY PURPOSE, NOTIFY LAB WITHIN 5 DAYS.  LOWEST DETECTABLE LIMITS FOR URINE DRUG SCREEN Drug Class                     Cutoff (ng/mL) Amphetamine and metabolites    1000 Barbiturate and metabolites    200 Benzodiazepine                 200 Opiates and metabolites        300 Cocaine and metabolites        300 THC                            50 Performed at [facility], [address] [place], [place] [address]    WBC 08/11/2022 2.9 (L)  4.0 - 10.5 K/uL Final   RBC 08/11/2022 4.95  4.22 - 5.81 MIL/uL Final  Hemoglobin 08/11/2022 16.0  13.0 - 17.0 g/dL Final   HCT 29/92/4268 46.3  39.0 - 52.0 % Final   MCV 08/11/2022 93.5  80.0 - 100.0 fL Final   MCH 08/11/2022 32.3  26.0 - 34.0 pg Final   MCHC 08/11/2022 34.6  30.0 - 36.0 g/dL Final   RDW 34/19/6222 12.0  11.5 - 15.5 % Final   Platelets 08/11/2022 114 (L)  150 - 400 K/uL Final   Comment: SPECIMEN CHECKED FOR CLOTS Immature Platelet Fraction may be clinically indicated, consider ordering this additional test [id number]    nRBC 08/11/2022 0.0  0.0 - 0.2 % Final   Performed at [facility], [address], [place], [place] [address]   Sodium 08/11/2022 135  135 - 145 mmol/L Final   Potassium 08/11/2022 3.8  3.5 - 5.1 mmol/L Final   Chloride 08/11/2022 96 (L)  98 - 111 mmol/L Final   CO2 08/11/2022 28  22 - 32 mmol/L Final   Glucose, Bld 08/11/2022 103 (H)  70 - 99 mg/dL Final   Glucose reference range applies only to samples taken after fasting for at least 8 hours.   BUN 08/11/2022 5 (L)  6 - 20 mg/dL Final   Creatinine, Ser 08/11/2022 0.81  0.61 - 1.24 mg/dL Final   Calcium 03/16/4817 8.1 (L)  8.9 - 10.3 mg/dL Final   GFR, Estimated 08/11/2022 >60  >60 mL/min Final   Comment: (NOTE) Calculated using the CKD-EPI Creatinine Equation  (2021)    Anion gap 08/11/2022 11  5 - 15 Final   Performed at [facility], [address], [place], [place] [address]   SARS Coronavirus 2 by RT PCR 08/12/2022 NEGATIVE  NEGATIVE Final   Comment: (NOTE) SARS-CoV-2 target nucleic acids are NOT DETECTED.  The SARS-CoV-2 RNA is generally detectable in upper respiratory specimens during the acute phase of infection. The lowest concentration of SARS-CoV-2 viral copies this assay can detect is 138 copies/mL. A negative result does not preclude SARS-Cov-2 infection and should not be used as the sole basis for treatment or other patient management decisions. A negative result may occur with  improper specimen collection/handling, submission of specimen other than nasopharyngeal swab, presence of viral mutation(s) within the areas targeted by this assay, and inadequate number of viral copies(<138 copies/mL). A negative result must be combined with clinical observations, patient history, and epidemiological information. The expected result is Negative.  Fact Sheet for Patients:  BloggerCourse.com  Fact Sheet for Healthcare Providers:  SeriousBroker.it  This test is no                          t yet approved or cleared by the [place] FDA and  has been authorized for detection and/or diagnosis of SARS-CoV-2 by FDA under an Emergency Use Authorization (EUA). This EUA will remain  in effect (meaning this test can be used) for the duration of the COVID-19 declaration under Section 564(b)(1) of the Act, 21 U.S.C.section 360bbb-3(b)(1), unless the authorization is terminated  or revoked sooner.       Influenza A by PCR 08/12/2022 NEGATIVE  NEGATIVE Final   Influenza B by PCR 08/12/2022 NEGATIVE  NEGATIVE Final   Comment: (NOTE) The Xpert Xpress SARS-CoV-2/FLU/RSV plus assay is intended as an aid in the diagnosis of influenza from Nasopharyngeal swab specimens and should not be used as  a sole basis for treatment. Nasal washings and aspirates are unacceptable for Xpert Xpress SARS-CoV-2/FLU/RSV testing.  Fact Sheet  for Patients: EntrepreneurPulse.com.au  Fact Sheet for Healthcare Providers: IncredibleEmployment.be  This test is not yet approved or cleared by the [place] FDA and has been authorized for detection and/or diagnosis of SARS-CoV-2 by FDA under an Emergency Use Authorization (EUA). This EUA will remain in effect (meaning this test can be used) for the duration of the COVID-19 declaration under Section 564(b)(1) of the Act, 21 U.S.C. section 360bbb-3(b)(1), unless the authorization is terminated or revoked.     Resp Syncytial Virus by PCR 08/12/2022 NEGATIVE  NEGATIVE Final   Comment: (NOTE) Fact Sheet for Patients: EntrepreneurPulse.com.au  Fact Sheet for Healthcare Providers: IncredibleEmployment.be  This test is not yet approved or cleared by the [place] FDA and has been authorized for detection and/or diagnosis of SARS-CoV-2 by FDA under an Emergency Use Authorization (EUA). This EUA will remain in effect (meaning this test can be used) for the duration of the COVID-19 declaration under Section 564(b)(1) of the Act, 21 U.S.C. section 360bbb-3(b)(1), unless the authorization is terminated or revoked.  Performed at [facility], [address] [place], [place] [address]     Blood Alcohol level:  Lab Results  Component Value Date   ETH <10 08/11/2022   ETH 391 (HH) 71/69/6789    Metabolic Disorder Labs: No results found for: "HGBA1C", "MPG" No results found for: "PROLACTIN" No results found for: "CHOL", "TRIG", "HDL", "CHOLHDL", "VLDL", "LDLCALC"  Therapeutic Lab Levels: No results found for: "LITHIUM" No results found for: "VALPROATE" No results found for: "CBMZ"  Physical Findings   AUDIT    Flowsheet Row Admission (Discharged) from 09/02/2014 in  [place] [facility]  Alcohol Use Disorder Identification Test Final Score (AUDIT) 27      PHQ2-9    Flowsheet Row ED from 08/12/2022 in [facility]  PHQ-2 Total Score 2  PHQ-9 Total Score 9      [place] [facility] from 08/12/2022 in [facility] ED from 08/11/2022 in [place] [facility] from 12/17/2020 in [place] No Risk High Risk No Risk        Musculoskeletal  Strength & Muscle Tone: within normal limits Gait & Station: normal Patient leans: N/A  Psychiatric Specialty Exam  Psychiatric Specialty Exam: Physical Exam Constitutional:      Appearance: the patient is not toxic-appearing.  Pulmonary:     Effort: Pulmonary effort is normal.  Neurological:     General: No focal deficit present.     Mental Status: the patient is alert and oriented to person, place, and time.   Review of Systems  Respiratory:  Negative for shortness of breath.   Cardiovascular:  Negative for chest pain.  Gastrointestinal:  Negative for abdominal pain, constipation, diarrhea, nausea and vomiting.  Neurological:  Negative for headaches.      BP 138/80   Pulse 94   Temp 98.1 F (36.7 C) (Oral)   Resp 18   SpO2 97%   General Appearance: Fairly Groomed  Eye Contact:  Good  Speech:  Clear and Coherent  Volume:  Normal  Mood:  Euthymic  Affect:  Congruent  Thought Process:  Coherent  Orientation:  Full (Time, Place, and Person)  Thought Content: Logical   Suicidal Thoughts:  No  Homicidal Thoughts:  No  Memory:  Immediate;   Good  Judgement:  fair  Insight:  fair  Psychomotor Activity:  Normal  Concentration:  Concentration: Good  Recall:  Good  Fund of Knowledge: Good  Language: Good  Akathisia:  No  Handed:    AIMS (if indicated): not done  Assets:  Communication Skills Desire for Improvement Financial Resources/Insurance Housing Leisure  Time Physical Health  ADL's:  Intact  Cognition: WNL  Sleep:  Fair     Physical Exam  Physical Exam Constitutional:      Appearance: the patient is not toxic-appearing.  Pulmonary:     Effort: Pulmonary effort is normal.  Neurological:     General: No focal deficit present.     Mental Status: the patient is alert and oriented to person, place, and time.   Review of Systems  Respiratory:  Negative for shortness of breath.   Cardiovascular:  Negative for chest pain.  Gastrointestinal:  Negative for abdominal pain, constipation, diarrhea, nausea and vomiting.  Neurological:  Negative for headaches.   Blood pressure 138/80, pulse 94, temperature 98.1 F (36.7 C), temperature source Oral, resp. rate 18, SpO2 97 %. There is no height or weight on file to calculate BMI.  Treatment Plan Summary: Daily contact with patient to assess and evaluate symptoms and progress in treatment and Medication management  Status: Voluntary   Alcohol use disorder, reported history of withdrawal seizures - Patient denies experiencing withdrawal presently and states his seizures only occurred years ago.  He appears calm.  - Scheduled gabapentin to treat alcohol withdrawal and prevent seizure - Did not desire to add benzodiazepine to the patient's regimen - CIWA with as needed Ativan - IM Ativan as needed for seizure   Chronic pain, treated with opioids - Consistent fills in the PDMP - Start oxycodone 5 mg every 8 as needed   Medical management - Labs notable for platelets of 114, on recheck down to 70s, of note patient was discharged from the hospital in 2015 with platelets for lower, felt to be due to his alcohol use.  Outpatient follow-up was recommended.  Discussed with patient. - Slightly elevated LFTs, patient declined hepatitis panel - Elevated T. bili and decreased albumin concerning for declining liver function   Dispo: can follow-up with [facility] on an outpatient basis, residential rehab  was recommended  Corky Sox, MD 08/13/2022 1:56 PM

## 2022-08-14 DIAGNOSIS — F1010 Alcohol abuse, uncomplicated: Secondary | ICD-10-CM | POA: Diagnosis not present

## 2022-08-14 LAB — MISC LABCORP TEST (SEND OUT): Labcorp test code: 144000

## 2022-08-14 MED ORDER — TRAZODONE HCL 50 MG PO TABS: 50.0000 mg | ORAL_TABLET | ORAL | 0 refills | Status: AC | PRN

## 2022-08-14 MED ORDER — IBUPROFEN 600 MG PO TABS
600.0000 mg | ORAL_TABLET | Freq: Once | ORAL | Status: AC
  Administered 2022-08-14: 600 mg via ORAL
  Filled 2022-08-14: qty 1

## 2022-08-14 MED ORDER — NICOTINE POLACRILEX 4 MG MT GUM: 4.0000 mg | CHEWING_GUM | OROMUCOSAL | 0 refills | Status: DC | PRN

## 2022-08-14 MED ORDER — NICOTINE 21 MG/24HR TD PT24: 21.0000 mg | MEDICATED_PATCH | Freq: Every day | TRANSDERMAL | 0 refills | Status: AC

## 2022-08-14 NOTE — ED Notes (Signed)
Patient is watching tv in the dayroom. Alert and oriented. Will continue to monitor for safety.

## 2022-08-14 NOTE — ED Notes (Signed)
Patient states that he is having pain all over and would like some pain medication.

## 2022-08-14 NOTE — ED Notes (Signed)
Patient requested tylenol for back pain

## 2022-08-14 NOTE — ED Notes (Signed)
Patient A&O x 4, ambulatory. Patient discharged in no acute distress. Patient denied SI/HI, A/VH upon discharge. Patient verbalized understanding of all discharge instructions explained by staff, to include follow up appointments, RX's and safety plan.  Pt did not have any belongings.  Patient escorted to lobby via staff for transport to destination. Safety maintained.

## 2022-08-14 NOTE — ED Notes (Signed)
Patient a&o x3. Denies SI/HI/AVH,Denies withdrawal sx now..Denies intent or plan  to harm self or others. Routine conducted according to faculty protocol .Encourge patient to notify  staff with any needs or concerns. Patient verbalized agreement and understaffing.Will continue to monitor for safety. 

## 2022-08-14 NOTE — Progress Notes (Signed)
LCSW Progress Note   LCSW provided information for [facility] for outpatient psychotherapy and medication management and for [facility] for CD-IOP.   Omelia Blackwater, MSW, [place] [facility]/FBC [phone] phone [phone] fax

## 2022-08-14 NOTE — ED Notes (Signed)
Patient observed/assessed in room in bed appearing in no immediate distress resting peacefully. Q15 minute checks continued by MHT and nursing staff. Will continue to monitor and support. 

## 2022-08-14 NOTE — Progress Notes (Signed)
SPIRITUALITY GROUP NOTE  Spirituality group facilitated by Simone Curia, MDiv, [place].  Group Description:  Group focused on topic of hope.  Patients participated in facilitated discussion around topic, connecting with one another around experiences and definitions for hope.  Group members engaged with visual explorer photos, reflecting on what hope looks like for them today.  Group engaged in discussion around how their definitions of hope are present today in hospital.   Modalities: Psycho-social ed, Adlerian, Narrative, MI Patient Progress: Frank Page was present and engaged throughout group.

## 2022-08-14 NOTE — ED Provider Notes (Signed)
FBC/OBS ASAP Discharge Summary  Date and Time: 08/14/2022 2:03 PM  Name: Frank Page  MRN:  [id number]   Discharge Diagnoses:  Final diagnoses:  Alcohol abuse    Subjective:  Frank Page is a 52 year old male with a past psychiatric history of alcohol use disorder, one previous behavioral health admission in 2016, as well as reported history of alcohol withdrawal seizures and chronic pain treated with opioids.  He reports that he is housed and has disability income, living with his brother who is a Nutritional therapist in [place], [place].  The patient initially presented to [facility] emergency department on 1/9 reporting drinking large quantities of alcohol and having suicidal thoughts.  He was transferred to the facility based crisis based on reported desire for detox and residential rehab.  On assessment 1/10 the patient denied interest in residential rehab and equivocated on whether or not he wants to stop drinking.  When asked what problems alcohol is caused to his life, he declines to name any.  He denies significant depressive symptoms, reporting no anhedonia and appropriate sleep and appetite.  He consistently denied experiencing suicidal thoughts since his presentation to the emergency department.  Residential rehab was recommended for the patient, which he declined.   Given documentation of the patient's friend reported that the patient had been "spiraling" for several months and the friend's report that the patient made a suicidal statement, we spoke with collateral with the patient's consent. Called patient's brother, Frank Page, [phone].  Alinda Money confirms the social history as given by the patient, including the fact that he is employed and lives with the patient (for many years) and that the patient receives SSI.  He does feel like the patient has significant anxiety and depression that he states that the patient drinks to excess frequently.  He confirms that the patient has made  suicidal statements before but he clarifies that this only occurs when the patient is drunk.  He feels confident that his brother would never act in accordance with these's.  He denies any previous suicide attempts.  He states that the patient has no access to firearms at the house.  Discussed extensively how the patient can receive help should he experience a mental health crisis put this information into the AVS.  His brother states that he has no safety concerns regarding his brother coming home.  Discussed his mental health follow-up plan with [facility] in [place] and his brother states that he intends to hold the patient accountable.   Interview today with the patient is very similar to the past two days. He lacks insight into his problematic alcohol use. He denies experiencing significant depression. He states that he will follow-up with [facility] in [place] to help with mental health. Denies experiencing any withdrawal symptoms and states that he has no medication side effects. He is noted to be euthymic throughout the interview and exhibits a future oriented thought process.   Stay Summary: uneventful detox, discharge home  Total Time spent with patient: 30 minutes  Past Psychiatric History: as above Past Medical History:  Past Medical History:  Diagnosis Date   Alcohol abuse 09/05/2013   Alcohol withdrawal (HCC) 09/05/2013   History of alcohol withdrawal seizures   Anxiety    Chronic pain syndrome 09/05/2013   Dyspnea    Herniated disc    Nonunion of fracture    Psoriasis    Thrombocytopenia, unspecified (HCC) 09/05/2013    Past Surgical History:  Procedure Laterality Date   ANTERIOR CERVICAL  DECOMP/DISCECTOMY FUSION N/A 11/22/2020   Procedure: Cervical Five-Six Cervical Six-Seven Anterior Cervical Decompression/Discectomy/Fusion;  Surgeon: Ashok Pall, MD;  Location: [facility];  Service: Neurosurgery;  Laterality: N/A;   HERNIA REPAIR     JOINT REPLACEMENT     ORIF FEMORAL SHAFT  FRACTURE W/ PLATES AND SCREWS     Family History: No family history on file. Family Psychiatric History: none Social History:  Social History   Substance and Sexual Activity  Alcohol Use Yes   Alcohol/week: 168.0 standard drinks of alcohol   Types: 168 Cans of beer per week   Comment: heavily     Social History   Substance and Sexual Activity  Drug Use Yes   Types: Marijuana    Social History   Socioeconomic History   Marital status: Divorced    Spouse name: Not on file   Number of children: Not on file   Years of education: Not on file   Highest education level: Not on file  Occupational History   Not on file  Tobacco Use   Smoking status: Every Day    Packs/day: 2.00    Years: 4.00    Total pack years: 8.00    Types: Cigarettes   Smokeless tobacco: Never  Vaping Use   Vaping Use: Never used  Substance and Sexual Activity   Alcohol use: Yes    Alcohol/week: 168.0 standard drinks of alcohol    Types: 168 Cans of beer per week    Comment: heavily   Drug use: Yes    Types: Marijuana   Sexual activity: Not on file  Other Topics Concern   Not on file  Social History Narrative   Not on file   Social Determinants of Health   Financial Resource Strain: Not on file  Food Insecurity: Not on file  Transportation Needs: Not on file  Physical Activity: Not on file  Stress: Not on file  Social Connections: Not on file   SDOH:  SDOH Screenings   Depression (PHQ2-9): Medium Risk (08/12/2022)  Tobacco Use: High Risk (08/11/2022)    Tobacco Cessation:  A prescription for an FDA-approved tobacco cessation medication provided at discharge  Current Medications:  Current Facility-Administered Medications  Medication Dose Route Frequency Provider Last Rate Last Admin   acetaminophen (TYLENOL) tablet 650 mg  650 mg Oral Q6H PRN Corky Sox, MD   650 mg at 08/14/22 1307   alum & mag hydroxide-simeth (MAALOX/MYLANTA) 200-200-20 MG/5ML suspension 30 mL  30 mL Oral Q4H  PRN Corky Sox, MD       gabapentin (NEURONTIN) capsule 300 mg  300 mg Oral TID Corky Sox, MD   300 mg at 08/14/22 0926   hydrOXYzine (ATARAX) tablet 10 mg  10 mg Oral TID PRN Corky Sox, MD   10 mg at 08/13/22 1253   hydrOXYzine (ATARAX) tablet 50 mg  50 mg Oral QHS PRN Corky Sox, MD   50 mg at 08/13/22 2105   LORazepam (ATIVAN) injection 2 mg  2 mg Intramuscular Once PRN Corky Sox, MD       LORazepam (ATIVAN) tablet 1 mg  1 mg Oral Q6H PRN Corky Sox, MD   1 mg at 08/13/22 1755   magnesium hydroxide (MILK OF MAGNESIA) suspension 30 mL  30 mL Oral Daily PRN Corky Sox, MD       naphazoline-glycerin (CLEAR EYES REDNESS) ophth solution 1-2 drop  1-2 drop Both Eyes QID PRN Corky Sox, MD   2 drop at 08/13/22 2107   nicotine (  NICODERM CQ - dosed in mg/24 hours) patch 21 mg  21 mg Transdermal Q0600 Carlyn Reichert, MD   21 mg at 08/14/22 5573   oxyCODONE (Oxy IR/ROXICODONE) immediate release tablet 5 mg  5 mg Oral Q8H PRN Carlyn Reichert, MD   5 mg at 08/14/22 0757   traZODone (DESYREL) tablet 50 mg  50 mg Oral QHS PRN Carlyn Reichert, MD   50 mg at 08/13/22 2105   Current Outpatient Medications  Medication Sig Dispense Refill   nicotine polacrilex (NICORETTE) 4 MG gum Take 1 each (4 mg total) by mouth as needed for smoking cessation. 100 tablet 0   [START ON 08/15/2022] nicotine (NICODERM CQ - DOSED IN MG/24 HOURS) 21 mg/24hr patch Place 1 patch (21 mg total) onto the skin daily at 6 (six) AM. 28 patch 0   oxyCODONE-acetaminophen (PERCOCET) 7.5-325 MG tablet Take 1 tablet by mouth 3 (three) times daily as needed for moderate pain.     traZODone (DESYREL) 50 MG tablet Take 1 tablet (50 mg total) by mouth every other day as needed for sleep. 15 tablet 0    PTA Medications: (Not in a hospital admission)      08/12/2022    9:48 AM  Depression screen PHQ 2/9  Decreased Interest 1  Down, Depressed, Hopeless 1  PHQ - 2 Score 2  Altered sleeping 2  Tired,  decreased energy 2  Change in appetite 1  Feeling bad or failure about yourself  1  Trouble concentrating 1  Moving slowly or fidgety/restless 0  Suicidal thoughts 0  PHQ-9 Score 9  Difficult doing work/chores Very difficult    Flowsheet Row ED from 08/12/2022 in [facility] ED from 08/11/2022 in [facility] ED from 12/17/2020 in [facility] EMERGENCY DEPARTMENT  C-SSRS RISK CATEGORY No Risk High Risk No Risk       Musculoskeletal  Strength & Muscle Tone: within normal limits Gait & Station: normal Patient leans: N/A  Psychiatric Specialty Exam  Psychiatric Specialty Exam: Physical Exam Constitutional:      Appearance: the patient is not toxic-appearing.  Pulmonary:     Effort: Pulmonary effort is normal.  Neurological:     General: No focal deficit present.     Mental Status: the patient is alert and oriented to person, place, and time.   Review of Systems  Respiratory:  Negative for shortness of breath.   Cardiovascular:  Negative for chest pain.  Gastrointestinal:  Negative for abdominal pain, constipation, diarrhea, nausea and vomiting.  Neurological:  Negative for headaches.      BP (!) 139/93   Pulse (!) 110   Temp 97.8 F (36.6 C) (Oral)   Resp 18   SpO2 98%   General Appearance: Fairly Groomed  Eye Contact:  Good  Speech:  Clear and Coherent  Volume:  Normal  Mood:  Euthymic  Affect:  Congruent  Thought Process:  Coherent  Orientation:  Full (Time, Place, and Person)  Thought Content: Logical   Suicidal Thoughts:  No  Homicidal Thoughts:  No  Memory:  Immediate;   Good  Judgement:  fair  Insight:  poor  Psychomotor Activity:  Normal  Concentration:  Concentration: Good  Recall:  Good  Fund of Knowledge: Good  Language: Good  Akathisia:  No  Handed:    AIMS (if indicated): not done  Assets:  Communication Skills Desire for Improvement Financial Resources/Insurance Housing Leisure Time Physical  Health  ADL's:  Intact  Cognition: WNL  Sleep:  Fair     Physical Exam  Physical Exam Constitutional:      Appearance: the patient is not toxic-appearing.  Pulmonary:     Effort: Pulmonary effort is normal.  Neurological:     General: No focal deficit present.     Mental Status: the patient is alert and oriented to person, place, and time.   Review of Systems  Respiratory:  Negative for shortness of breath.   Cardiovascular:  Negative for chest pain.  Gastrointestinal:  Negative for abdominal pain, constipation, diarrhea, nausea and vomiting.  Neurological:  Negative for headaches.   Blood pressure (!) 139/93, pulse (!) 110, temperature 97.8 F (36.6 C), temperature source Oral, resp. rate 18, SpO2 98 %. There is no height or weight on file to calculate BMI.  Demographic Factors:  Male  Loss Factors: NA  Historical Factors: NA  Risk Reduction Factors:   Living with another person, especially a relative, Positive social support, Positive therapeutic relationship, and Positive coping skills or problem solving skills  Continued Clinical Symptoms:  Alcohol/Substance Abuse/Dependencies  Cognitive Features That Contribute To Risk:  None    Suicide Risk:  Mild: The patient has little demonstrated past psychiatric history aside from alcohol use disorder.  He is consistently demonstrated a euthymic mood and affect and future oriented thinking.  He states that he desires sobriety and intends to follow-up with mental health resources, despite his shallow insight.  He has good social support and does not have access to firearms per the patient and his brother.  I do still have some concern about the patient making suicidal statements while intoxicated, with a concern that he may act on them at some point.  Unfortunately he does not agree to more rigorous treatment and does not meet involuntary commitment criteria.  Plan Of Care/Follow-up recommendations:  Activity as  tolerated. Diet as recommended by PCP. Keep all scheduled follow-up appointments as recommended.  Patient is instructed to take all prescribed medications as recommended. Report any side effects or adverse reactions to your outpatient psychiatrist. Patient is instructed to abstain from alcohol and illegal drugs while on prescription medications. In the event of worsening symptoms, patient is instructed to call the crisis hotline, 911, or go to the nearest emergency department for evaluation and treatment.  Prescriptions given at discharge. Patient agreeable to plan. Given opportunity to ask questions. Appears to feel comfortable with discharge.  Patient is also instructed prior to discharge to: Take all medications as prescribed by mental healthcare provider. Report any adverse effects and or reactions from the medicines to outpatient provider promptly. Patient has been instructed & cautioned: To not engage in alcohol and or illegal drug use while on prescription medicines. In the event of worsening symptoms,  patient is instructed to call the crisis hotline, 911 and or go to the nearest ED for appropriate evaluation and treatment of symptoms. To follow-up with primary care provider for other medical issues, concerns and or health care needs  The patient was evaluated each day by a clinical provider to ascertain response to treatment. Improvement was noted by the patient's report of decreasing symptoms, improved sleep and appetite, affect, medication tolerance, behavior, and participation in unit programming.  Patient was asked each day to complete a self inventory noting mood, mental status, pain, new symptoms, anxiety and concerns.  Patient responded well to medication and being in a therapeutic and supportive environment. Positive and appropriate behavior was noted and the patient was motivated for recovery. The patient worked  closely with the treatment team and case manager to develop a discharge  plan with appropriate goals. Coping skills, problem solving as well as relaxation therapies were also part of the unit programming.  By the day of discharge patient was in much improved condition than upon admission.  Symptoms were reported as significantly decreased or resolved completely. The patient was motivated to continue taking medication with a goal of continued improvement in mental health.    Disposition: home, plan to follow up at [facility], MD 08/14/2022, 2:03 PM

## 2022-08-14 NOTE — ED Notes (Signed)
"  Pt denies SI, plan, and intention.  Suicide safety plan completed, reviewed with this RN, given to the patient, and a copy in the chart."worked on plan for group 

## 2022-08-14 NOTE — Group Note (Signed)
Group Topic: Identity and Relationships  Group Date: 08/14/2022 Start Time: 0800 End Time: 0830 Facilitators: Mervyn Skeeters D, NT  Department: [facility]  Number of Participants: 6  Group Focus: individual meeting Treatment Modality:  Psychoeducation Interventions utilized were leisure development Purpose: regain self-worth  Name: Frank Page Date of Birth: 1971-06-29  MR: [id number]    Level of Participation: moderate Quality of Participation: attentive Interactions with others: gave feedback Mood/Affect: appropriate Triggers (if applicable): NA Cognition: coherent/clear Progress: Moderate Response: NA Plan: follow-up needed  Patients Problems:  Patient Active Problem List   Diagnosis Date Noted   HNP (herniated nucleus pulposus), cervical 11/22/2020   Alcohol dependence with withdrawal with complication ([place]) 54/49/2010   Substance induced mood disorder ([place]) 09/02/2014   Suicidal ideation    Alcoholic hepatitis 02/11/1974   Pneumonia 09/05/2013   Alcohol abuse 09/05/2013   Transaminitis 09/05/2013   Thrombocytopenia, unspecified ([place]) 09/05/2013   Chronic pain syndrome 09/05/2013

## 2022-09-24 ENCOUNTER — Ambulatory Visit (INDEPENDENT_AMBULATORY_CARE_PROVIDER_SITE_OTHER): Payer: Medicare HMO | Admitting: Orthopaedic Surgery

## 2022-09-24 ENCOUNTER — Ambulatory Visit (INDEPENDENT_AMBULATORY_CARE_PROVIDER_SITE_OTHER): Payer: Medicare HMO

## 2022-09-24 ENCOUNTER — Encounter: Payer: Self-pay | Admitting: Orthopaedic Surgery

## 2022-09-24 VITALS — Ht 62.0 in | Wt 137.0 lb

## 2022-09-24 DIAGNOSIS — M79661 Pain in right lower leg: Secondary | ICD-10-CM

## 2022-09-24 DIAGNOSIS — M542 Cervicalgia: Secondary | ICD-10-CM
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
## 2022-10-01 ENCOUNTER — Encounter: Payer: Self-pay | Admitting: Radiology

## 2022-10-22 ENCOUNTER — Ambulatory Visit: Payer: Medicare HMO | Admitting: Orthopaedic Surgery

## 2022-12-24 ENCOUNTER — Ambulatory Visit (INDEPENDENT_AMBULATORY_CARE_PROVIDER_SITE_OTHER): Payer: Medicare HMO | Admitting: Orthopaedic Surgery

## 2022-12-24 ENCOUNTER — Encounter: Payer: Self-pay | Admitting: Orthopaedic Surgery

## 2022-12-24 VITALS — Ht 62.0 in | Wt 147.5 lb

## 2022-12-24 DIAGNOSIS — T8484XA Pain due to internal orthopedic prosthetic devices, implants and grafts, initial encounter: Secondary | ICD-10-CM

## 2022-12-24 DIAGNOSIS — G894 Chronic pain syndrome: Secondary | ICD-10-CM

## 2022-12-30 DIAGNOSIS — T8484XA Pain due to internal orthopedic prosthetic devices, implants and grafts, initial encounter: Secondary | ICD-10-CM | POA: Insufficient documentation

## 2022-12-30 NOTE — Progress Notes (Signed)
Office Visit Note   Patient: Frank Page           Date of Birth: [birth year]           MRN: [id number] Visit Date: 12/24/2022              Requested by: Center, [facility] [address] [place],  [place] [address] PCP: Center, [facility]   Assessment & Plan: Visit Diagnoses:  1. Chronic pain syndrome   2. Painful orthopaedic hardware [facility])            Right tibial screw  Plan: Will set patient up for removal of single screw tibial tubercle which is in the subcutaneous tissue easily palpable on the right leg.  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  Local anesthesia with mild sedation.  Patient has a cervical pseudoarthrosis and does not need to be intubated for this procedure.  Follow-Up Instructions: No follow-ups on file.   Orders:  No orders of the defined types were placed in this encounter.  No orders of the defined types were placed in this encounter.     Procedures: No procedures performed   Clinical Data: No additional findings.   Subjective: Chief Complaint  Patient presents with   Right Knee - Pain    HPI 51 year old male past history of alcoholic hepatitis chronic pain on Percocet 7.5 325 is having problems with the painful screw from previous tib-fib fracture with interlock.  Fracture is completely healed from 11 years ago but the proximal screw he bumps frequently and touching it causes hyperesthesia.  He is requesting screw removal.  Quit smoking 4 months ago.  Previous surgery for cervical fusion by Dr. Franky Macho.  Painful screw in his right leg is anterolateral just below the tibial tubercle.  Patient on pain management from [facility] medical.  Patient cervical fusion two-level cervical he does have lucency within inferior screw 7 mm off the bone.  Review of Systems   Objective: Vital Signs: Ht 5\' 2"  (1.575 m)   Wt 147 lb 8 oz (66.9 kg)   BMI 26.98 kg/m   Physical Exam Constitutional:       Appearance: He is well-developed.  HENT:     Head: Normocephalic and atraumatic.     Right Ear: External ear normal.     Left Ear: External ear normal.  Eyes:     Pupils: Pupils are equal, round, and reactive to light.  Neck:     Thyroid: No thyromegaly.     Trachea: No tracheal deviation.  Cardiovascular:     Rate and Rhythm: Normal rate.  Pulmonary:     Effort: Pulmonary effort is normal.     Breath sounds: No wheezing.  Abdominal:     General: Bowel sounds are normal.     Palpations: Abdomen is soft.  Musculoskeletal:     Cervical back: Neck supple.  Skin:    General: Skin is warm and dry.     Capillary Refill: Capillary refill takes less than 2 seconds.  Neurological:     Mental Status: He is alert and oriented to person, place, and time.  Psychiatric:        Behavior: Behavior normal.        Thought Content: Thought content normal.        Judgment: Judgment normal.     Ortho Exam healed scars from tibial nail fixation.  Prominent screw proximal lateral just lateral to the tibial  tubercle exquisitely tender with positive Tinel's.  Mild crepitus knee range of motion.  Specialty Comments:  No specialty comments available.  Imaging: No results found.   PMFS History: Patient Active Problem List   Diagnosis Date Noted   Painful orthopaedic hardware (HCC) 12/30/2022   HNP (herniated nucleus pulposus), cervical 11/22/2020   Alcohol dependence with withdrawal with complication (HCC) 09/02/2014   Substance induced mood disorder (HCC) 09/02/2014   Suicidal ideation    Alcoholic hepatitis 09/06/2013   Pneumonia 09/05/2013   Alcohol abuse 09/05/2013   Transaminitis 09/05/2013   Thrombocytopenia, unspecified (HCC) 09/05/2013   Chronic pain syndrome 09/05/2013   Past Medical History:  Diagnosis Date   Alcohol abuse 09/05/2013   Alcohol withdrawal (HCC) 09/05/2013   History of alcohol withdrawal seizures   Anxiety    Chronic pain syndrome 09/05/2013   Dyspnea     Herniated disc    Nonunion of fracture    Psoriasis    Thrombocytopenia, unspecified (HCC) 09/05/2013    No family history on file.  Past Surgical History:  Procedure Laterality Date   ANTERIOR CERVICAL DECOMP/DISCECTOMY FUSION N/A 11/22/2020   Procedure: Cervical Five-Six Cervical Six-Seven Anterior Cervical Decompression/Discectomy/Fusion;  Surgeon: Coletta Memos, MD;  Location: [facility] OR;  Service: Neurosurgery;  Laterality: N/A;   HERNIA REPAIR     JOINT REPLACEMENT     ORIF FEMORAL SHAFT FRACTURE W/ PLATES AND SCREWS     Social History   Occupational History   Not on file  Tobacco Use   Smoking status: Every Day    Packs/day: 2.00    Years: 4.00    Additional pack years: 0.00    Total pack years: 8.00    Types: Cigarettes   Smokeless tobacco: Never  Vaping Use   Vaping Use: Never used  Substance and Sexual Activity   Alcohol use: Yes    Alcohol/week: 168.0 standard drinks of alcohol    Types: 168 Cans of beer per week    Comment: heavily   Drug use: Yes    Types: Marijuana   Sexual activity: Not on file

## 2023-01-12 ENCOUNTER — Telehealth: Payer: Self-pay

## 2023-01-12 NOTE — Telephone Encounter (Signed)
I called patient to discuss scheduling tibial hardware removal.  Left voice mail for him to return my call.

## 2023-02-12 ENCOUNTER — Other Ambulatory Visit: Payer: Self-pay | Admitting: Physician Assistant

## 2023-02-17 ENCOUNTER — Encounter (HOSPITAL_BASED_OUTPATIENT_CLINIC_OR_DEPARTMENT_OTHER): Payer: Self-pay | Admitting: Orthopaedic Surgery

## 2023-02-20 NOTE — Anesthesia Preprocedure Evaluation (Addendum)
Anesthesia Evaluation  Patient identified by MRN, date of birth, ID band Patient awake    Reviewed: Allergy & Precautions, NPO status , Patient's Chart, lab work & pertinent test results  Airway Mallampati: II       Dental  (+) Upper Dentures, Lower Dentures   Pulmonary shortness of breath and with exertion, pneumonia, resolved, Current SmokerPatient did not abstain from smoking.   Pulmonary exam normal breath sounds clear to auscultation       Cardiovascular negative cardio ROS Normal cardiovascular exam Rhythm:Regular Rate:Normal     Neuro/Psych Seizures -,  PSYCHIATRIC DISORDERS Anxiety     ETOH withdrawal related    GI/Hepatic negative GI ROS,,,(+)     substance abuse  alcohol use and marijuana use, Hepatitis -, UnspecifiedElevated LFT's   Endo/Other    Renal/GU   negative genitourinary   Musculoskeletal Chronic pain syndrome Painful right tibial nail Psoriasis   Abdominal   Peds  Hematology Hx/o thrombocytopenia   Anesthesia Other Findings   Reproductive/Obstetrics                             Anesthesia Physical Anesthesia Plan  ASA: 3  Anesthesia Plan: MAC   Post-op Pain Management: Dilaudid IV, Precedex and Ofirmev IV (intra-op)*   Induction:   PONV Risk Score and Plan: 2 and Treatment may vary due to age or medical condition, Ondansetron and Dexamethasone  Airway Management Planned: Natural Airway and Simple Face Mask  Additional Equipment:   Intra-op Plan:   Post-operative Plan:   Informed Consent: I have reviewed the patients History and Physical, chart, labs and discussed the procedure including the risks, benefits and alternatives for the proposed anesthesia with the patient or authorized representative who has indicated his/her understanding and acceptance.     Dental advisory given  Plan Discussed with: CRNA and Anesthesiologist  Anesthesia Plan  Comments:         Anesthesia Quick Evaluation

## 2023-02-22 ENCOUNTER — Ambulatory Visit (HOSPITAL_BASED_OUTPATIENT_CLINIC_OR_DEPARTMENT_OTHER): Payer: Medicare HMO | Admitting: Anesthesiology

## 2023-02-22 ENCOUNTER — Encounter (HOSPITAL_BASED_OUTPATIENT_CLINIC_OR_DEPARTMENT_OTHER): Payer: Self-pay | Admitting: Orthopaedic Surgery

## 2023-02-22 ENCOUNTER — Encounter (HOSPITAL_BASED_OUTPATIENT_CLINIC_OR_DEPARTMENT_OTHER)
Admission: RE | Disposition: A | Discharge status: Home / Self Care | Payer: Self-pay | Source: Home / Self Care | Attending: Orthopaedic Surgery

## 2023-02-22 ENCOUNTER — Ambulatory Visit (HOSPITAL_BASED_OUTPATIENT_CLINIC_OR_DEPARTMENT_OTHER): Payer: Medicare HMO

## 2023-02-22 ENCOUNTER — Ambulatory Visit (HOSPITAL_BASED_OUTPATIENT_CLINIC_OR_DEPARTMENT_OTHER)
Admission: RE | Admit: 2023-02-22 | Discharge: 2023-02-22 | Disposition: A | Discharge status: Home / Self Care | Payer: Medicare HMO | Attending: Orthopaedic Surgery | Admitting: Orthopaedic Surgery

## 2023-02-22 ENCOUNTER — Other Ambulatory Visit: Payer: Self-pay

## 2023-02-22 DIAGNOSIS — F419 Anxiety disorder, unspecified: Secondary | ICD-10-CM | POA: Insufficient documentation

## 2023-02-22 DIAGNOSIS — F17210 Nicotine dependence, cigarettes, uncomplicated: Secondary | ICD-10-CM | POA: Insufficient documentation

## 2023-02-22 DIAGNOSIS — T8484XA Pain due to internal orthopedic prosthetic devices, implants and grafts, initial encounter: Secondary | ICD-10-CM

## 2023-02-22 DIAGNOSIS — Z79899 Other long term (current) drug therapy: Secondary | ICD-10-CM | POA: Diagnosis not present

## 2023-02-22 DIAGNOSIS — K7010 Alcoholic hepatitis without ascites: Secondary | ICD-10-CM | POA: Diagnosis not present

## 2023-02-22 DIAGNOSIS — F1290 Cannabis use, unspecified, uncomplicated: Secondary | ICD-10-CM | POA: Diagnosis not present

## 2023-02-22 DIAGNOSIS — G894 Chronic pain syndrome: Secondary | ICD-10-CM | POA: Diagnosis not present

## 2023-02-22 DIAGNOSIS — F10239 Alcohol dependence with withdrawal, unspecified: Secondary | ICD-10-CM

## 2023-02-22 DIAGNOSIS — L409 Psoriasis, unspecified: Secondary | ICD-10-CM | POA: Diagnosis not present

## 2023-02-22 DIAGNOSIS — T85848A Pain due to other internal prosthetic devices, implants and grafts, initial encounter: Secondary | ICD-10-CM | POA: Diagnosis not present

## 2023-02-22 DIAGNOSIS — Y838 Other surgical procedures as the cause of abnormal reaction of the patient, or of later complication, without mention of misadventure at the time of the procedure: Secondary | ICD-10-CM | POA: Diagnosis not present

## 2023-02-22 DIAGNOSIS — F1010 Alcohol abuse, uncomplicated: Secondary | ICD-10-CM | POA: Insufficient documentation

## 2023-02-22 DIAGNOSIS — Z4589 Encounter for adjustment and management of other implanted devices: Secondary | ICD-10-CM | POA: Diagnosis not present

## 2023-02-22 HISTORY — PX: HARDWARE REMOVAL: SHX979

## 2023-02-22 LAB — COMPREHENSIVE METABOLIC PANEL
ALT: 19 U/L (ref 0–44)
AST: 22 U/L (ref 15–41)
Albumin: 4 g/dL (ref 3.5–5.0)
Alkaline Phosphatase: 64 U/L (ref 38–126)
Anion gap: 8 (ref 5–15)
BUN: 9 mg/dL (ref 6–20)
CO2: 25 mmol/L (ref 22–32)
Calcium: 8.9 mg/dL (ref 8.9–10.3)
Chloride: 105 mmol/L (ref 98–111)
Creatinine, Ser: 1.02 mg/dL (ref 0.61–1.24)
GFR, Estimated: >60 mL/min (ref >60)
Glucose, Bld: 96 mg/dL (ref 70–99)
Potassium: 3.4 mmol/L — ABNORMAL LOW (ref 3.5–5.1)
Sodium: 138 mmol/L (ref 135–145)
Total Bilirubin: 0.4 mg/dL (ref 0.3–1.2)
Total Protein: 6.9 g/dL (ref 6.5–8.1)

## 2023-02-22 SURGERY — REMOVAL, HARDWARE
Anesthesia: Monitor Anesthesia Care | Site: Leg Lower | Laterality: Right

## 2023-02-22 MED ORDER — LACTATED RINGERS IV SOLN: INTRAVENOUS | Status: DC

## 2023-02-22 MED ORDER — CEFAZOLIN SODIUM-DEXTROSE 2-4 GM/100ML-% IV SOLN
INTRAVENOUS | Status: AC
  Filled 2023-02-22: qty 100

## 2023-02-22 MED ORDER — PROPOFOL 10 MG/ML IV BOLUS
INTRAVENOUS | Status: AC
  Filled 2023-02-22: qty 20

## 2023-02-22 MED ORDER — ONDANSETRON HCL 4 MG/2ML IJ SOLN: 4.0000 mg | Freq: Once | INTRAMUSCULAR | Status: DC | PRN

## 2023-02-22 MED ORDER — ONDANSETRON HCL 4 MG/2ML IJ SOLN
INTRAMUSCULAR | Status: DC | PRN
  Administered 2023-02-22: 4 mg via INTRAVENOUS

## 2023-02-22 MED ORDER — DEXMEDETOMIDINE HCL IN NACL 200 MCG/50ML IV SOLN
INTRAVENOUS | Status: DC | PRN
  Administered 2023-02-22: 12 ug via INTRAVENOUS

## 2023-02-22 MED ORDER — FENTANYL CITRATE (PF) 100 MCG/2ML IJ SOLN
INTRAMUSCULAR | Status: AC
  Filled 2023-02-22: qty 2

## 2023-02-22 MED ORDER — OXYCODONE HCL 5 MG/5ML PO SOLN: 5.0000 mg | Freq: Once | ORAL | Status: DC | PRN

## 2023-02-22 MED ORDER — VANCOMYCIN HCL 1000 MG IV SOLR
INTRAVENOUS | Status: DC | PRN
  Administered 2023-02-22: 1000 mg via INTRAVENOUS

## 2023-02-22 MED ORDER — MIDAZOLAM HCL 2 MG/2ML IJ SOLN
INTRAMUSCULAR | Status: AC
  Filled 2023-02-22: qty 2

## 2023-02-22 MED ORDER — OXYCODONE HCL 5 MG PO TABS: 5.0000 mg | ORAL_TABLET | Freq: Once | ORAL | Status: DC | PRN

## 2023-02-22 MED ORDER — PROPOFOL 500 MG/50ML IV EMUL
INTRAVENOUS | Status: DC | PRN
  Administered 2023-02-22: 200 ug/kg/min via INTRAVENOUS

## 2023-02-22 MED ORDER — MIDAZOLAM HCL 2 MG/2ML IJ SOLN
INTRAMUSCULAR | Status: DC | PRN
  Administered 2023-02-22: 2 mg via INTRAVENOUS

## 2023-02-22 MED ORDER — BUPIVACAINE HCL (PF) 0.25 % IJ SOLN
INTRAMUSCULAR | Status: AC
  Filled 2023-02-22: qty 30

## 2023-02-22 MED ORDER — FENTANYL CITRATE (PF) 100 MCG/2ML IJ SOLN
INTRAMUSCULAR | Status: DC | PRN
  Administered 2023-02-22: 100 ug via INTRAVENOUS

## 2023-02-22 MED ORDER — BUPIVACAINE HCL (PF) 0.25 % IJ SOLN
INTRAMUSCULAR | Status: DC | PRN
  Administered 2023-02-22: 4 mL

## 2023-02-22 MED ORDER — MORPHINE SULFATE (PF) 4 MG/ML IV SOLN: 1.0000 mg | INTRAVENOUS | Status: DC | PRN

## 2023-02-22 MED ORDER — ONDANSETRON HCL 4 MG/2ML IJ SOLN
INTRAMUSCULAR | Status: AC
  Filled 2023-02-22: qty 2

## 2023-02-22 MED ORDER — VANCOMYCIN HCL IN DEXTROSE 1-5 GM/200ML-% IV SOLN
INTRAVENOUS | Status: AC
  Filled 2023-02-22: qty 200

## 2023-02-22 SURGICAL SUPPLY — 46 items
BANDAGE ESMARK 6X9 LF (GAUZE/BANDAGES/DRESSINGS) IMPLANT
BLADE SURG 15 STRL LF DISP TIS (BLADE) ×2 IMPLANT
BLADE SURG 15 STRL SS (BLADE) ×1
BNDG CMPR 5X4 KNIT ELC UNQ LF (GAUZE/BANDAGES/DRESSINGS)
BNDG CMPR 9X4 STRL LF SNTH (GAUZE/BANDAGES/DRESSINGS)
BNDG CMPR 9X6 STRL LF SNTH (GAUZE/BANDAGES/DRESSINGS)
BNDG ELASTIC 4INX 5YD STR LF (GAUZE/BANDAGES/DRESSINGS) IMPLANT
BNDG ESMARK 4X9 LF (GAUZE/BANDAGES/DRESSINGS) IMPLANT
BNDG ESMARK 6X9 LF (GAUZE/BANDAGES/DRESSINGS)
BNDG GZE 12X3 1 PLY HI ABS (GAUZE/BANDAGES/DRESSINGS) ×1
BNDG STRETCH GAUZE 3IN X12FT (GAUZE/BANDAGES/DRESSINGS) ×2 IMPLANT
CORD BIPOLAR FORCEPS 12FT (ELECTRODE) IMPLANT
COVER BACK TABLE 60X90IN (DRAPES) IMPLANT
COVER MAYO STAND STRL (DRAPES) IMPLANT
DRAPE EXTREMITY T 121X128X90 (DISPOSABLE) ×2 IMPLANT
DRAPE OEC MINIVIEW 54X84 (DRAPES) IMPLANT
ELECT REM PT RETURN 9FT ADLT (ELECTROSURGICAL) ×1
ELECTRODE REM PT RTRN 9FT ADLT (ELECTROSURGICAL) ×2 IMPLANT
GAUZE XEROFORM 1X8 LF (GAUZE/BANDAGES/DRESSINGS) ×2 IMPLANT
GLOVE BIO SURGEON STRL SZ7.5 (GLOVE) ×2 IMPLANT
GLOVE BIOGEL PI IND STRL 8 (GLOVE) ×2 IMPLANT
GOWN STRL REUS W/ TWL LRG LVL3 (GOWN DISPOSABLE) ×2 IMPLANT
GOWN STRL REUS W/ TWL XL LVL3 (GOWN DISPOSABLE) ×2 IMPLANT
GOWN STRL REUS W/TWL LRG LVL3 (GOWN DISPOSABLE) ×1
GOWN STRL REUS W/TWL XL LVL3 (GOWN DISPOSABLE) ×1
NDL HYPO 25X1 1.5 SAFETY (NEEDLE) IMPLANT
NEEDLE HYPO 25X1 1.5 SAFETY (NEEDLE)
NS IRRIG 1000ML POUR BTL (IV SOLUTION) ×2 IMPLANT
PACK ARTHROSCOPY DSU (CUSTOM PROCEDURE TRAY) ×2 IMPLANT
PACK BASIN DAY SURGERY FS (CUSTOM PROCEDURE TRAY) ×2 IMPLANT
PAD CAST 3X4 CTTN HI CHSV (CAST SUPPLIES) IMPLANT
PADDING CAST ABS COTTON 4X4 ST (CAST SUPPLIES) ×2 IMPLANT
PADDING CAST COTTON 3X4 STRL (CAST SUPPLIES)
PENCIL SMOKE EVACUATOR (MISCELLANEOUS) ×2 IMPLANT
SHEET MEDIUM DRAPE 40X70 STRL (DRAPES) IMPLANT
SPIKE FLUID TRANSFER (MISCELLANEOUS) IMPLANT
SPLINT PLASTER CAST XFAST 3X15 (CAST SUPPLIES) IMPLANT
STOCKINETTE 4X48 STRL (DRAPES) IMPLANT
SUCTION TUBE FRAZIER 10FR DISP (SUCTIONS) IMPLANT
SUT ETHILON 4 0 PS 2 18 (SUTURE) ×4 IMPLANT
SUT ETHILON 5 0 PS 2 18 (SUTURE) ×2 IMPLANT
SUT ETHILON 6 0 P 1 (SUTURE) ×2 IMPLANT
SYR BULB EAR ULCER 3OZ GRN STR (SYRINGE) IMPLANT
TOWEL GREEN STERILE FF (TOWEL DISPOSABLE) ×4 IMPLANT
UNDERPAD 30X36 HEAVY ABSORB (UNDERPADS AND DIAPERS) ×2 IMPLANT
YANKAUER SUCT BULB TIP NO VENT (SUCTIONS) IMPLANT

## 2023-02-22 NOTE — Interval H&P Note (Signed)
History and Physical Interval Note:  02/22/2023 8:45 AM  Frank Page  has presented today for surgery, with the diagnosis of painful right tibial nail proximal interlock screw.  The various methods of treatment have been discussed with the patient and family. After consideration of risks, benefits and other options for treatment, the patient has consented to  Procedure(s): REMOVAL OF RIGHT TIBIAL INTERLOCK SCREW (Right) as a surgical intervention.  The patient's history has been reviewed, patient examined, no change in status, stable for surgery.  I have reviewed the patient's chart and labs.  Questions were answered to the patient's satisfaction.     Eldred Manges

## 2023-02-22 NOTE — Anesthesia Postprocedure Evaluation (Signed)
Anesthesia Post Note  Patient: Frank Page  Procedure(s) Performed: REMOVAL OF RIGHT TIBIAL INTERLOCK SCREW (Right: Leg Lower)     Patient location during evaluation: PACU Anesthesia Type: MAC Level of consciousness: awake and alert and oriented Pain management: pain level controlled Vital Signs Assessment: post-procedure vital signs reviewed and stable Respiratory status: spontaneous breathing, nonlabored ventilation and respiratory function stable Cardiovascular status: stable and blood pressure returned to baseline Postop Assessment: no apparent nausea or vomiting Anesthetic complications: no   No notable events documented.  Last Vitals:  Vitals:   02/22/23 1000 02/22/23 1014  BP: 99/60 100/76  Pulse: 62 (!) 59  Resp: 14 20  Temp:  36.6 C  SpO2: 97% 97%    Last Pain:  Vitals:   02/22/23 1014  TempSrc: Temporal  PainSc: 0-No pain                 Patricio Popwell A.

## 2023-02-22 NOTE — Discharge Instructions (Addendum)
Leave dressing on for 48 hours then you can remove it take shower and after you shower just apply a large Band-Aid over the sutures.  You may have some bruising near the incision.  You can use some ice intermittently and take the pain medication that you already on for pain management.  See Dr. Ophelia Charter next week for wound check and sutures will likely be removed at 10 to 14 days. Post Anesthesia Home Care Instructions  Activity: Get plenty of rest for the remainder of the day. A responsible individual must stay with you for 24 hours following the procedure.  For the next 24 hours, DO NOT: -Drive a car -Advertising copywriter -Drink alcoholic beverages -Take any medication unless instructed by your physician -Make any legal decisions or sign important papers.  Meals: Start with liquid foods such as gelatin or soup. Progress to regular foods as tolerated. Avoid greasy, spicy, heavy foods. If nausea and/or vomiting occur, drink only clear liquids until the nausea and/or vomiting subsides. Call your physician if vomiting continues.  Special Instructions/Symptoms: Your throat may feel dry or sore from the anesthesia or the breathing tube placed in your throat during surgery. If this causes discomfort, gargle with warm salt water. The discomfort should disappear within 24 hours.  If you had a scopolamine patch placed behind your ear for the management of post- operative nausea and/or vomiting:  1. The medication in the patch is effective for 72 hours, after which it should be removed.  Wrap patch in a tissue and discard in the trash. Wash hands thoroughly with soap and water. 2. You may remove the patch earlier than 72 hours if you experience unpleasant side effects which may include dry mouth, dizziness or visual disturbances. 3. Avoid touching the patch. Wash your hands with soap and water after contact with the patch.

## 2023-02-22 NOTE — H&P (Signed)
Assessment & Plan: Visit Diagnoses:  1. Chronic pain syndrome   2. Painful orthopaedic hardware [facility])            Right tibial screw   Plan: Will set patient up for removal of single screw tibial tubercle which is in the subcutaneous tissue easily palpable on the right leg.  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  Local anesthesia with mild sedation.  Patient has a cervical pseudoarthrosis and does not need to be intubated for this procedure.   Follow-Up Instructions: No follow-ups on file.    Orders:  No orders of the defined types were placed in this encounter.   No orders of the defined types were placed in this encounter.        Procedures: No procedures performed     Clinical Data: No additional findings.     Subjective:    Chief Complaint  Patient presents with   Right Knee - Pain      HPI 52 year old male past history of alcoholic hepatitis chronic pain on Percocet 7.5 325 is having problems with the painful screw from previous tib-fib fracture with interlock.  Fracture is completely healed from 11 years ago but the proximal screw he bumps frequently and touching it causes hyperesthesia.  He is requesting screw removal.  Quit smoking 4 months ago.  Previous surgery for cervical fusion by Dr. Franky Macho.  Painful screw in his right leg is anterolateral just below the tibial tubercle.  Patient on pain management from [facility] medical.  Patient cervical fusion two-level cervical he does have lucency within inferior screw 7 mm off the bone.   Review of Systems     Objective: Vital Signs: Ht 5\' 2"  (1.575 m)   Wt 147 lb 8 oz (66.9 kg)   BMI 26.98 kg/m    Physical Exam Constitutional:      Appearance: He is well-developed.  HENT:     Head: Normocephalic and atraumatic.     Right Ear: External ear normal.     Left Ear: External ear normal.  Eyes:     Pupils: Pupils are equal, round, and reactive to light.  Neck:     Thyroid: No  thyromegaly.     Trachea: No tracheal deviation.  Cardiovascular:     Rate and Rhythm: Normal rate.  Pulmonary:     Effort: Pulmonary effort is normal.     Breath sounds: No wheezing.  Abdominal:     General: Bowel sounds are normal.     Palpations: Abdomen is soft.  Musculoskeletal:     Cervical back: Neck supple.  Skin:    General: Skin is warm and dry.     Capillary Refill: Capillary refill takes less than 2 seconds.  Neurological:     Mental Status: He is alert and oriented to person, place, and time.  Psychiatric:        Behavior: Behavior normal.        Thought Content: Thought content normal.        Judgment: Judgment normal.        Ortho Exam healed scars from tibial nail fixation.  Prominent screw proximal lateral just lateral to the tibial tubercle exquisitely tender with positive Tinel's.  Mild crepitus knee range of motion.   Specialty Comments:  No specialty comments available.   Imaging: No results found.     PMFS History:     Patient Active Problem List    Diagnosis  Date Noted   Painful orthopaedic hardware (HCC) 12/30/2022   HNP (herniated nucleus pulposus), cervical 11/22/2020   Alcohol dependence with withdrawal with complication (HCC) 09/02/2014   Substance induced mood disorder (HCC) 09/02/2014   Suicidal ideation     Alcoholic hepatitis 09/06/2013   Pneumonia 09/05/2013   Alcohol abuse 09/05/2013   Transaminitis 09/05/2013   Thrombocytopenia, unspecified (HCC) 09/05/2013   Chronic pain syndrome 09/05/2013        Past Medical History:  Diagnosis Date   Alcohol abuse 09/05/2013   Alcohol withdrawal (HCC) 09/05/2013    History of alcohol withdrawal seizures   Anxiety     Chronic pain syndrome 09/05/2013   Dyspnea     Herniated disc     Nonunion of fracture     Psoriasis     Thrombocytopenia, unspecified (HCC) 09/05/2013        No family history on file.           Past Surgical History:  Procedure Laterality Date   ANTERIOR CERVICAL  DECOMP/DISCECTOMY FUSION N/A 11/22/2020    Procedure: Cervical Five-Six Cervical Six-Seven Anterior Cervical Decompression/Discectomy/Fusion;  Surgeon: Coletta Memos, MD;  Location: [facility] OR;  Service: Neurosurgery;  Laterality: N/A;   HERNIA REPAIR       JOINT REPLACEMENT       ORIF FEMORAL SHAFT FRACTURE W/ PLATES AND SCREWS            Social History         Occupational History   Not on file  Tobacco Use   Smoking status: Every Day      Packs/day: 2.00      Years: 4.00      Additional pack years: 0.00      Total pack years: 8.00      Types: Cigarettes   Smokeless tobacco: Never  Vaping Use   Vaping Use: Never used  Substance and Sexual Activity   Alcohol use: Yes      Alcohol/week: 168.0 standard drinks of alcohol      Types: 168 Cans of beer per week      Comment: heavily   Drug use: Yes      Types: Marijuana   Sexual activity: Not on file

## 2023-02-22 NOTE — Transfer of Care (Signed)
Immediate Anesthesia Transfer of Care Note  Patient: OMEED OSUNA  Procedure(s) Performed: REMOVAL OF RIGHT TIBIAL INTERLOCK SCREW (Right)  Patient Location: PACU  Anesthesia Type:MAC  Level of Consciousness: awake and patient cooperative  Airway & Oxygen Therapy: Patient Spontanous Breathing and Patient connected to face mask oxygen  Post-op Assessment: Report given to RN and Post -op Vital signs reviewed and stable  Post vital signs: Reviewed and stable  Last Vitals:  Vitals Value Taken Time  BP    Temp    Pulse 58 02/22/23 0929  Resp 13 02/22/23 0929  SpO2 99 % 02/22/23 0929  Vitals shown include unfiled device data.  Last Pain:  Vitals:   02/22/23 0647  TempSrc: Temporal  PainSc: 7       Patients Stated Pain Goal: 3 (02/22/23 4696)  Complications: No notable events documented.

## 2023-02-22 NOTE — Op Note (Signed)
Pre and postop diagnosis: Right tibial interlock nail proximal interlock painful screw.  Procedure: Removal of proximal interlock screw right tibia.  Surgeon: Ophelia Charter MD  Anesthesia IV sedation plus local 5 cc Marcaine 0.25%.  Tourniquet: None.  52 year old male history of alcoholic hepatitis chronic pain on chronic Percocet and also morphine with persistent sharp pain when he bumps his knee pain at night.  Previous tibial nail placement was 11 years ago and this was secondary renailing for delayed union or nonunion and he had a distal fibular osteotomy which's still remains.  He said pain at the screw site with palpation and activity and states even the sheet on his leg is painful.  Procedure after prepped with ChloraPrep extremity sheets and draped impervious stockinette timeout procedure infiltration in the old longitudinal incision which was 1.5 cm was performed and subtendinous tissue was carefully spread.  Muscle was split screw head was visualized and [employer] screwdriver was used to remove the proximal interlock screw which was bronze or gold collar.  Screws removed irrigation.  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  Nylon 3-0 interrupted sutures x 3 placed Xeroform 4 x 4's and Ace wrap was applied for postop dressing.  Patient can follow-up in the office in 1 week.

## 2023-02-23 ENCOUNTER — Encounter (HOSPITAL_BASED_OUTPATIENT_CLINIC_OR_DEPARTMENT_OTHER): Payer: Self-pay | Admitting: Orthopaedic Surgery

## 2023-03-03 ENCOUNTER — Encounter: Payer: Self-pay | Admitting: Orthopaedic Surgery

## 2023-03-03 ENCOUNTER — Ambulatory Visit: Payer: Medicare HMO | Admitting: Orthopaedic Surgery

## 2023-03-03 VITALS — Ht 62.0 in | Wt 139.0 lb

## 2023-03-03 DIAGNOSIS — G894 Chronic pain syndrome: Secondary | ICD-10-CM

## 2023-03-03 NOTE — Progress Notes (Signed)
.    Return for removal of pain.  Tibial interlock screw proximally.  He persistently had bumped it or had electrical sensation if he touched where the screw was present.  Screw was removed and 2 sutures removed today Steri-Strips applied.  He has noted good relief from the removal of the screw.  He is on chronic oxycodone supplied elsewhere.  Incision looks good he is released from care and can return on an as-needed basis.

## 2023-06-15 ENCOUNTER — Encounter: Payer: Self-pay | Admitting: *Deleted

## 2023-12-14 ENCOUNTER — Encounter (INDEPENDENT_AMBULATORY_CARE_PROVIDER_SITE_OTHER): Payer: Self-pay | Admitting: *Deleted

## 2024-05-14 NOTE — ED Provider Notes (Signed)
 Emergency Department Provider Note    ED Clinical Impression   Final diagnoses:  Pneumonia due to infectious organism, unspecified laterality, unspecified part of lung (Primary)  Hypoxemia  Congestive heart failure, unspecified HF chronicity, unspecified heart failure type (CMS-HCC)    ED Assessment/Plan    Condition: Stable Disposition: Admit  This chart has been completed using Engineer, Civil (consulting) software, and while attempts have been made to ensure accuracy, certain words and phrases may not be transcribed as intended.   History   Chief Complaint  Patient presents with  . Shortness of Breath  . Cough  . Dizziness  . Near Syncope   HPI  Frank Page is a 53 y.o. male  who presents today to the  emergency department complaining of cough, congestion, shortness of breath for the past several days.  Patient states cough is present with clear sputum.  He denies any fever or chills.  He denies any pain.  He describes symptoms as moderate.  Patient notes that he ran out of his medications recently.    Allergies: is allergic to penicillins. Medications: has a current medication list which includes the following long-term medication(s): albuterol , atorvastatin, fluoxetine, gabapentin , pregabalin, and trazodone . PMHx:  has a past medical history of Asthma (HHS-HCC) and Chronic pain after traumatic injury. PSHx:  has a past surgical history that includes Hernia repair (Left); Fracture surgery (Right); Hip surgery (Left); Neck surgery; and pr colsc flx w/rmvl of tumor polyp lesion snare tq (N/A, 03/13/2024). SocHx:  reports that he has been smoking cigarettes and cigars. He started smoking about 17 months ago. He has a 30 pack-year smoking history. He has never used smokeless tobacco. He reports current alcohol  use of about 12.0 standard drinks of alcohol  per week. He reports current drug  use. Drug: Marijuana. Allergies, Medications, Medical, Surgical, and Social History were reviewed as documented above.   Social Drivers of Health with Concerns   Food Insecurity: Not on file  Tobacco Use: High Risk (05/01/2024)   Patient History   . Smoking Tobacco Use: Every Day   . Smokeless Tobacco Use: Never   . Passive Exposure: Not on file  Transportation Needs: Not on file  Alcohol  Use: Not on file  Housing: Not on file  Physical Activity: Not on file  Utilities: Not on file  Stress: Not on file  Substance Use: Not on file (06/09/2023)  Social Connections: Not on file  Financial Resource Strain: Not on file  Health Literacy: Not on file  Internet Connectivity: Not on file     Review Of Systems  Review of Systems  Constitutional:  Negative for fever.  HENT:  Positive for congestion.   Respiratory:  Positive for cough and shortness of breath. Negative for chest tightness.   Cardiovascular:  Negative for chest pain.  Gastrointestinal:  Negative for abdominal pain.  Skin:  Negative for color change.  Psychiatric/Behavioral:  Negative for behavioral problems.   All other systems reviewed and are negative.   Physical Exam   BP 119/72   Pulse 99   Temp 36.7 C (98 F)   Resp 24   SpO2 93%   Physical Exam Vitals and nursing note reviewed.  Constitutional:      General: He is in acute distress.  HENT:     Head: Normocephalic.  Eyes:     Conjunctiva/sclera: Conjunctivae normal.  Cardiovascular:     Rate and Rhythm: Regular rhythm. Tachycardia present.     Pulses: Normal pulses.     Heart sounds: Normal heart sounds.  Pulmonary:     Effort: No respiratory distress.     Breath sounds: Rhonchi and rales present.     Comments: Differential diagnosis appreciated.  Rhonchi in the lung bases. Abdominal:     General: There is no distension.     Tenderness: There is no abdominal tenderness. There is no guarding or rebound.  Musculoskeletal:        General: No  deformity.     Right lower leg: No edema.     Left lower leg: No edema.  Skin:    General: Skin is warm.     Capillary Refill: Capillary refill takes 2 to 3 seconds.     Comments: Normal cap refill.  Neurological:     General: No focal deficit present.  Psychiatric:        Mood and Affect: Mood normal.     ED Course  Medical Decision Making Differential diagnosis includes pneumonia versus influenza versus COVID-19 infection versus reactive airway disease.  Patient meets SIRS criteria.  Sepsis pathway initiated.  EKG shows sinus tachycardia at 127 bpm.  Normal axis.  Normal intervals.  No acute injury pattern.  2:13 PM Chest x-ray suggestive of multifocal pneumonia.  SEPSIS FLUID STATEMENT  Administration of 30 mL/kg of crystalloid fluids would be detrimental or harmful for this patient, Frank Page, despite having hypotension, a lactate greater than or equal to 4 mmol/L, or documentation of severe sepsis for the following reason:   - Blood pressure responded to lesser volume, concern for CHF.  The volume of crystalloid fluids in place of 30 mL/kg that this patient, Frank Page, was to receive is 1000 ml.  2:48 PM Patient's BNP is significantly elevated.  Given this, it is a possibility he could have an element of failure since he does have bilateral interstitial prominence.  Clinically, I still think it is most likely pneumonia.  However, we will go ahead and give him some Lasix to diurese him.  3:04 PM Patient feels better.  Patient's care discussed with Dr. Feliz, hospitalist.  Will admit.  4:44 PM Repeat Volume Status and Tissue Perfusion Assessment:  I have re-evaluated this patient at  4:44 PM, and  I have completed a focused tissue perfusion assessment.  Vital Signs:  BP 119/72   Pulse 99   Temp 36.7 C (98 F)   Resp 24   SpO2 93%    Mental Status:  Alert, oriented, thought content appropriate Cardiopulmonary Exam:  Cardiac: Regular rate and  rhythm Pulmonary: Rhonchi Capillary Refill:  < or = 2 seconds Peripheral Pulse Evaluation:  radial R:2+ (normal)/L:2+ (normal) Skin Examination:  No signs of Cyanosis          Amount and/or Complexity of Data Reviewed Labs: ordered. Radiology: ordered. ECG/medicine tests: ordered.  Risk Prescription drug management. Decision regarding hospitalization.     Critical Care  Performed by: Fote, Ardeen Hanger, MD Authorized by: Elisabeth Brutus Bryant, FNP   Critical care provider statement:    Critical care time (minutes):  30   Critical care time was exclusive of:  Separately billable procedures and treating other patients   Critical care was necessary to treat or prevent imminent or life-threatening deterioration of the following conditions: Hypoxemia.   Critical care was time spent personally by  me on the following activities:  Discussions with consultants, ordering and review of radiographic studies, ordering and review of laboratory studies, re-evaluation of patient's condition and evaluation of patient's response to treatment   Care discussed with: admitting provider      Encounter Date: 05/14/24  ECG 12 Lead  Result Value   EKG Systolic BP    EKG Diastolic BP    EKG Ventricular Rate 127   EKG Atrial Rate 127   EKG P-R Interval 148   EKG QRS Duration 84   EKG Q-T Interval 324   EKG QTC Calculation 470   EKG Calculated P Axis 72   EKG Calculated R Axis 87   EKG Calculated T Axis 59   QTC Fredericia 416   Narrative   Sinus tachycardia Otherwise normal ECG When compared with ECG of 16-Oct-2022 16:34, T wave inversion no longer evident in Inferior leads Confirmed by Cherie Searle ([id number]) on 05/14/2024 2:29:24 PM     ED Results Results for orders placed or performed during the hospital encounter of 05/14/24  Rapid Influenza / RSV / COVID PCR   Specimen: Nasopharyngeal Swab  Result Value Ref Range   SARS-CoV-2 PCR Negative Negative   Influenza A Negative  Negative   Influenza B Negative Negative   RSV Negative Negative  Comprehensive Metabolic Panel  Result Value Ref Range   Sodium 134 (L) 135 - 145 mmol/L   Potassium 3.5 3.5 - 5.0 mmol/L   Chloride 96 (L) 98 - 107 mmol/L   CO2 31.0 21.0 - 32.0 mmol/L   Anion Gap 7 3 - 11 mmol/L   BUN 7 (L) 8 - 20 mg/dL   Creatinine 9.43 (L) 9.19 - 1.30 mg/dL   BUN/Creatinine Ratio 13    eGFR CKD-EPI (2021) Male >90 >=60 mL/min/1.91m2   Glucose 129 70 - 179 mg/dL   Calcium 8.6 8.5 - 89.8 mg/dL   Albumin 2.3 (L) 3.5 - 5.0 g/dL   Total Protein 6.5 6.0 - 8.0 g/dL   Total Bilirubin 0.4 0.3 - 1.2 mg/dL   AST 12 (L) 15 - 40 U/L   ALT 19 12 - 78 U/L   Alkaline Phosphatase 107 46 - 116 U/L  Lactate Sepsis  Result Value Ref Range   Lactate 0.9 0.5 - 1.9 mmol/L  Pro-BNP  Result Value Ref Range   PRO-BNP 1,544.0 (H) 0.0 - 125.0 pg/mL  ECG 12 Lead  Result Value Ref Range   EKG Systolic BP  mmHg   EKG Diastolic BP  mmHg   EKG Ventricular Rate 127 BPM   EKG Atrial Rate 127 BPM   EKG P-R Interval 148 ms   EKG QRS Duration 84 ms   EKG Q-T Interval 324 ms   EKG QTC Calculation 470 ms   EKG Calculated P Axis 72 degrees   EKG Calculated R Axis 87 degrees   EKG Calculated T Axis 59 degrees   QTC Fredericia 416 ms  Arterial Blood Gas  Result Value Ref Range   pH, Arterial 7.40 7.35 - 7.45   pCO2, Arterial 46.8 35.0 - 48.0 mm[Hg]   pO2, Arterial 39 (LL) 83 - 108 mm[Hg]   HCO3 (Bicarbonate), Arterial 29.2 (H) 18.0 - 23.0 mmol/L   Base Excess, Arterial 3.6 (H) -2.0 - 3.0 mmol/L   O2 Sat, Arterial 73.2 (L) 95.0 - 98.0 %   Total Carbon Dioxide, Arterial 31 (H) 22 - 29 mmol/L   Carboxyhemoglobin 1.5 0.0 - 4.9 %   ABG Allen Test POS  BG Draw Site Radial, left    ABG Comment ROOM AIR   CBC w/ Differential  Result Value Ref Range   WBC 13.0 (H) 4.0 - 10.5 10*9/L   RBC 4.12 4.10 - 5.60 10*12/L   HGB 12.4 (L) 12.5 - 17.0 g/dL   HCT 63.0 63.9 - 49.9 %   MCV 89.6 80.0 - 98.0 fL   MCH 30.1 27.0 - 34.0 pg    MCHC 33.6 32.0 - 36.0 g/dL   RDW 87.7 88.4 - 85.4 %   MPV 9.1 7.4 - 10.4 fL   Platelet 277 140 - 415 10*9/L   Neutrophils % 76.7 %   Lymphocytes % 7.1 %   Monocytes % 14.5 %   Eosinophils % 0.1 %   Basophils % 0.4 %   Absolute Neutrophils 10.0 (H) 1.8 - 7.8 10*9/L   Absolute Lymphocytes 0.9 0.7 - 4.5 10*9/L   Absolute Monocytes 1.9 (H) 0.1 - 1.0 10*9/L   Absolute Eosinophils 0.0 0.0 - 0.4 10*9/L   Absolute Basophils 0.1 0.0 - 0.2 10*9/L   CTA Chest W Contrast Result Date: 05/14/2024 EXAM: CTA chest with IV contrast-pulmonary embolism protocol  HISTORY:  Hypoxia  Technique: Chest CTA with IV contrast (pulmonary embolism protocol). This examination was specifically tailored to evaluate the pulmonary arteries for the presence of intraluminal thrombus.  Volumetric axial CT Maximum Intensity Projection (MIP) pulmonary angiographic images were generated at the scanner and sent to PACS for review.   DLP:  AEC (automated exposure control) and/or manual techniques such as size-specific kV and mAs are employed where appropriate to reduce radiation exposure for all CT exams.  COMPARISON:  Noncontrast CT 03/27/2024  FINDINGS:  Pulmonary Arteries: There is adequate contrast filling of the pulmonary arteries through the proximal segmental level. The mid and peripheral segmental pulmonary arteries are obscured by diffuse motion artifact. There are no intraluminal filling defects through the proximal segmental level.  Cardiac/mediastinum: There are scattered 3-8 mm mediastinal lymph nodes, slightly increased in size compared with the prior. There are no enlarged mediastinal lymph nodes. A lymph node in the superior right hilum measures 15 mm short axis, new compared with the prior. A lymph node in the mid right hilum measures 8 mm short axis. There are no enlarged left hilar lymph nodes.  Lungs and pleura: There are extensive heterogeneous and partially confluent nodular opacities throughout the lungs diffusely.  These opacities have a slight upper lobe distribution. Areas of groundglass and slightly more confluent heterogeneous opacity identified in the central upper lobes and peripheral right middle lobe and left upper lobe lingula, and to a lesser extent the infrahilar left lower lobe.  The central airways are patent. The pleural spaces are normal.  Upper abdomen: The visualized portions of the upper abdomen are normal.  Bones and soft tissues: The regional bones and soft tissues are intact.     1.    Negative for central pulmonary embolism. The mid and peripheral pulmonary arteries are obscured by motion artifact. 2.    Extensive heterogeneous and partially confluent nodular opacities are identified throughout the lungs diffusely, with a slight upper lobe distribution. The appearance is most suspicious for interstitial/atypical pneumonia, though pulmonary edema is also possible. The miliary distribution raises the possibility of mycobacterial infection, mycoplasma, fungal infection, and viral pneumonia. 3.    There are enlarged right hilar lymph nodes and borderline enlarged mediastinal lymph nodes, likely reactive.  Signed (Electronic Signature): 05/14/2024 4:20 PM Signed By: Debby LITTIE Willy MICKEY, MD  ECG 12 Lead Result Date: 05/14/2024 Sinus tachycardia Otherwise normal ECG When compared with ECG of 16-Oct-2022 16:34, T wave inversion no longer evident in Inferior leads Confirmed by Cherie Searle ([id number]) on 05/14/2024 2:29:24 PM  XR Chest Portable Result Date: 05/14/2024 Exam: Portable Chest  History:  Shortness of breath, cough  Technique: One frontal portable view  Comparison:  Noncontrast chest CT 08/28/2023  Findings:    Lungs: There are coarse heterogeneous reticular opacities throughout the lungs bilaterally, right greater than left, new compared with the prior CT.  Mediastinum: Heart size is normal.  Pleural Spaces: The pleural spaces are normal.  Bones: The regional bones are intact.     There are  new asymmetric heterogeneous interstitial opacities bilaterally, right greater than left. The appearance is most suspicious for interstitial pneumonia.  Signed (Electronic Signature): 05/14/2024 1:25 PM Signed By: Debby LITTIE Willy MICKEY, MD   Medications Administered:  Medications  FLUoxetine (PROZAC) capsule 10 mg (has no administration in time range)  methylPREDNISolone  sodium succinate (SOLU-Medrol ) injection 40 mg (has no administration in time range)  ipratropium-albuterol  (DUO-NEB) 0.5-2.5 mg/3 mL nebulizer solution 3 mL (3 mL Nebulization Given 05/14/24 1346)  methylPREDNISolone  sodium succinate (SOLU-Medrol ) injection 125 mg (125 mg Intravenous Given 05/14/24 1346)  levoFLOXacin  (LEVAQUIN ) 750 mg/150 mL IVPB 750 mg (0 mg Intravenous Stopped 05/14/24 1541)  aztreonam (AZACTAM) 2 g in sodium chloride  0.9 % (NS) 100 mL IVPB-connector bag (0 g Intravenous Stopped 05/14/24 1645)  sodium chloride  0.9% (NS) bolus 1,000 mL (0 mL Intravenous Stopped 05/14/24 1603)  furosemide (LASIX) injection 40 mg (40 mg Intravenous Given 05/14/24 1510)  iohexol  (OMNIPAQUE ) 350 mg iodine/mL solution (  Given 05/14/24 1608)  hydrOXYzine  (ATARAX ) tablet 25 mg (25 mg Oral Given 05/14/24 1739)    Discharge Medications (Medications Prescribed during this  ED visit and Patient's Home Medications) :    Your Medication List     ASK your doctor about these medications    albuterol  90 mcg/actuation inhaler Commonly known as: PROVENTIL  HFA;VENTOLIN  HFA 2 puffs every four (4) hours as needed.   atorvastatin 40 MG tablet Commonly known as: LIPITOR Take 1 tablet (40 mg total) by mouth daily.   buPROPion 100 MG 12 hr tablet Commonly known as: Wellbutrin SR Take 1 tablet (100 mg total) by mouth Three (3) times a day after meals.   cyclobenzaprine 10 MG tablet Commonly known as: FLEXERIL 1 tablet (10 mg total).   fentaNYL  12 mcg/hr patch Commonly known as: DURAGESIC  Place 1 patch on the skin every third day.    FLUoxetine 40 MG capsule Commonly known as: PROZAC Take 1 capsule (40 mg total) by mouth daily.   gabapentin  300 MG capsule Commonly known as: NEURONTIN  Take 1 capsule (300 mg total) by mouth two (2) times a day.   hydrOXYzine  25 MG tablet Commonly known as: ATARAX  TAKE 1 TABLET BY MOUTH UP TO TWICE DAILY AS NEEDED FOR SLEEP OR ANXIETY   NARCAN 4 mg/actuation nasal spray Generic drug: naloxone 1 spray.   oxyCODONE  10 mg Tr12 12 hr crush resistant ER/CR tablet Commonly known as: OxyCONTIN  Take 1 tablet (10 mg total) by mouth two (2) times a day as needed for pain.   oxyCODONE -acetaminophen  7.5-325 mg per tablet Commonly known as: PERCOCET Take 1 tablet by mouth every six (6) hours as needed for pain.   pregabalin 25 MG capsule Commonly known as: LYRICA 1 capsule (25 mg total).   tizanidine 2 MG tablet Commonly known as: ZANAFLEX Take 1 tablet (  2 mg total) by mouth every six (6) hours.   traZODone  50 MG tablet Commonly known as: DESYREL  Take 2 tablets (100 mg total) by mouth nightly.          Cherie Ardeen Hanger, MD 05/14/24 1745

## 2024-05-14 NOTE — ACP (Advance Care Planning) (Signed)
 ADVANCE CARE PLANNING NOTE  Discussion Date:  May 14, 2024  Patient has decisional capacity:  Yes  Patient has selected a Health Care Decision-Maker if loses capacity: Yes  Health Care Decision Maker as of 05/14/2024 Maci Mullett, [phone], daughter   Discussion Participants: Pt, myself, brother Koren  Communication of Medical Status/Prognosis:  Yes  Communication of Treatment Goals/Options:  Discussed pt's treatment plan, code status  Treatment Decisions:  Agrees with the treatment plan, full code     I spent 4 minutes providing voluntary advance care planning services for this patient.
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
## 2024-05-24 NOTE — Nursing Note (Signed)
   05/24/24 1354  Final Assessment  Patient's Post Acute Contact Information see demo  Has a PCP appointment been made? No  Has a specialist appointment been made? No  Post Acute Facility needed at discharge? No  Home Care/ Home Medical Equipment needed at discharge? Yes  Home Care/ Home Medical Equipment [facility] Supplies) (specify) (Nebulizer)  Outpatient/Community Referrals needed for discharge? No  Currently receiving outpatient dialysis? N/A  Discharge Disposition Home w/ Self Care  Transportation Anticipated fleeta, wheelchair accessible (Moving on faith)  Quality data for continuing care services shared with patient and/or representative? Yes  Patient and/or family were provided with choice of facilities / services that are available and appropriate to meet post hospital care needs? N/A  Final Assessment Complete  Final Assessment Complete Yes

## 2024-05-24 NOTE — Discharge Summary (Signed)
 ------------------------------------------------------------------------------- Attestation signed by Feliz Margart Fallow, DO at 05/28/24 [phone] I was the supervising physician during time of service.  Case was discussed with APP and I agree with management as below. -------------------------------------------------------------------------------   DISCHARGE SUMMARY Frank Page [facility]   Discharge date:   May 24, 2024 Length of stay:    LOS: 10 days    Discharge Service:   [facility] Hospitalists Discharge Attending Physician: Brutus Franco Shank, FNP Discharge to:    To Home Condition at Discharge:  stable Code status:                         Full Code   Hospital Course: 05/14/24 admitted with sepsis, atypical pneumonia, acute hypoxic respiratory failure.  IV antibiotics, IV steroids, nebulizer treatments, 4 L of oxygen.  CIWA protocol in place.  05/16/24 WBC increased, oxygen needs increase to 5L, escalate IV antibx to Cefepime and Vanc.  Positive for rhinovirus.    05/19/24 ==> Review of pt's chart at time of discharge shows concern for possible TB.  Spoke with pt to get more details and he states he has had hemoptysis, 10 lb wt loss over last month, night sweats, along with CTA findings of:   Extensive heterogeneous and partially confluent nodular opacities are identified throughout the lungs diffusely, with a slight upper lobe distribution. The appearance is most suspicious for interstitial/atypical pneumonia, though pulmonary edema is also possible. The miliary distribution raises the possibility of mycobacterial infection, mycoplasma, fungal infection, and viral pneumonia.   Additionally, on admission on 05/14/24, he presented with 10-11 days of cough, congestion, SOB, and chills.  PO2 was 39 on presenting ABG  Discussed w/ attending, Dr Heath, who recommends placing pt in negative pressure isolation room and testing pt for TB.  ============  Frank Page is a  53 year old male with a history of chronic obstructive pulmonary disease (COPD), primary atypical interstitial pneumonia, opioid dependence, anxiety disorder, and alcohol  use, who was admitted on 05/14/2024 for acute hypoxic respiratory failure and sepsis secondary to atypical pneumonia.  PROBLEM-ORIENTED HOSPITAL SUMMARY  Acute Hypoxic Respiratory Failure, Atypical/Interstitial Pneumonia, and Sepsis  He presented with 10-11 days of cough, congestion, shortness of breath, and chills, with worsening dyspnea prompting admission. On admission, he was hypoxic (O2 sat 73.2% on room air, pO2 39), tachypneic, and ill-appearing, requiring 4 L/min supplemental oxygen, which was later increased to 5 L/min due to persistent hypoxia and increased work of breathing. Imaging revealed extensive bilateral interstitial and nodular opacities, right greater than left, consistent with interstitial/atypical pneumonia; CTA was negative for pulmonary embolism. He was initially treated with IV levofloxacin , IV steroids, and nebulizer treatments; levofloxacin  was discontinued during the hospitalization. Due to rising WBC and increased oxygen requirements, antibiotics were escalated to cefepime and vancomycin , then transitioned to meropenem and vancomycin , both of which were discontinued as blood cultures remained negative and clinical status stabilized. He remained on IV steroids, with the dose increased to TID for worsening wheezing. Oxygen requirements fluctuated, with persistent exertional hypoxia and desaturation to 84% on room air with ambulation; he was discharged with home oxygen arranged through [employer]. Sputum cultures were attempted, and respiratory pathogen panel was positive for rhinovirus/enterovirus; Legionella and other pathogens were negative. Blood cultures remained negative throughout the admission.  COPD and Chronic Respiratory Disease  He has a history of COPD and primary atypical interstitial pneumonia, with  chronic daily cigar use. During admission, he experienced persistent wheezing and coarse breath sounds, requiring frequent nebulizer treatments  and chest physiotherapy. He was not on home oxygen prior to admission. At discharge, he continued to require supplemental oxygen with exertion and had persistent symptoms of dyspnea and wheezing.  Rhinovirus  Respiratory pathogen panel was positive for rhinovirus/enterovirus, which contributed to his respiratory symptoms and was managed supportively.  Opioid Dependence and Chronic Pain  He has a history of opioid dependence and chronic pain after traumatic injury, previously managed with pain medications but transitioned to buprenorphine (Subutex) prior to admission. Subutex was continued during hospitalization. Pain was well controlled throughout the admission, with pain scores consistently at zero.  Alcohol  Use and Withdrawal Risk  He reported drinking 6-10 beers approximately three times per week, with last use one week prior to admission. CIWA protocol was implemented for withdrawal monitoring; he had a CIWA score of 14 at one point and received PRN diazepam  for symptoms.  Anxiety Disorder  He has a history of anxiety disorder, managed with hydroxyzine , bupropion, and fluoxetine. He experienced intermittent anxiety during hospitalization, particularly with episodes of dyspnea, and received PRN hydroxyzine  and diazepam  with good effect.  Malnutrition and Weight Loss  He had a 10-pound weight loss in the month prior to admission, with poor appetite and decreased oral intake. Nutrition consult recommended oral nutritional supplements and encouragement of PO intake, with some improvement in appetite during hospitalization.  Tobacco Use  He is a daily cigar smoker and was offered nicotine  replacement therapy during admission.  Other Chronic Conditions  His past medical history includes asthma and chronic pain after traumatic injury. No acute issues  related to these conditions arose during this hospitalization.  Advance Care Planning and Code Status  He participated in advance care planning and designated his daughter as health care decision-maker. He remained full code throughout the admission.  Disposition  He was discharged with home oxygen arranged, and follow-up was recommended.  Allergies  He has a documented allergy to penicillins (anaphylaxis).   ______________________________________   Admission HPI    Patient admitted on: 05/14/2024 12:55 PM  Patient admitted by: Brutus Franco Shank, FNP    CHIEF COMPLAINT: Shortness of breath, cough, sick X about 10 days   Day of admission HPI:  Frank Page  is a 53 y.o. male with a PMH significant for alcohol  use, chronic pain, COPD, anxiety, daily cigar smoker who presented with illness times about 10 to 11 days consisting of congestion, cough, shortness of breath, chills.  Shortness of breath worsened and patient decided he needed to come to the hospital today.  Has not taken any of his medications today.  Daily cigar smoker.  PCP Silvio at [facility].  Drinks about 6-10 beers approximately 3 times a week.  Last time he drank was 1 week ago, last Sunday.  Lives with his brother Koren who is at bedside.  Denies nausea or vomiting, has had some intermittent diarrhea and decreased appetite.  Weakness and lack of energy.   Significant findings include white blood count 13.0, sodium 134, BNP 1544, ABG showing pO2 39 with oxygen saturation 73.2 on room air.  RPP negative, chest x-ray shows interstitial opacities bilaterally right greater than left suspicious for interstitial pneumonia.  CTA negative for PE.  Shows extensive heterogeneous and partially confluent nodular opacities with a slight upper lobe distribution suspicious for interstitial/atypical pneumonia.  Admit patient with IV antibiotics, oxygen support, nebulizer treatments, IV steroids.   Patient admitted on Home O2? -  no Patient on home anticoagulant? -  no Patient admitted with Chronic  home foley catheter? - no Foley catheter placed or replaced by another service prior to admission? - no Central Line Status: NONE   Mental Status on Admission: The patient is Alert and oriented to PERSON The patient is Alert And oriented to TIME The patient is Alert and oriented to LOCATION  Today; patient is sitting up in bed awake and alert.  States he feels so much better and is very grateful for the care he has received.  Appetite good.  Shortness of breath with exertion has basically resolved.   Problem List, Assessment & Plan     ASSESSMENT & PLAN (In order of descending acuity)   Atypical pneumonia, acute hypoxic respiratory failure, sepsis with organ dysfunction, rhinovirus Received Merrem and Vanc, IV, up to 5 L of oxygen, nebulizer treatments, IV steroids Patient with severe hypoxia and increased work of breathing, tachypnea on admission Sepsis as evidenced by hypotension, tachypnea, tachycardia, hypoxic respiratory failure, source pneumonia Patient's blood pressure stabilized with IV fluids, did not need pressors Legionella negative, rhinovirus positive, negative for flu/COVID/RSV AFB cultures pending, smears negative for TB; patient was unable to produce another sputum culture as requested by ID I-S, Acapella Wheezing has basically resolved, will send home on prednisone taper WBC trending down, is also on steroids   Alcohol  use CIWA protocol although patient has not had alcohol  in 1 week reportedly Valium  in place for prn use Hydroxyzine  on schedule, pt is finding this helpful   History of chronic pain and opioid use Patient is now seeing mental health NP at [facility] clinic, has been taken off pain medication and is using Subutex Continued Subutex while admitted   Anxiety Continued hydroxyzine  on schedule as well as Wellbutrin and Prozac daily Patient is calm but intermittently anxious    Tobacco user Daily cigar smoker Nicotine  patch available if needed Patient may have an element of COPD as well   Anticoagulation; Lovenox Oxygen; room air IV fluids; stopped IV antibiotics; stopped   Incidental Findings for outpatient Follow-Up: No significant incidental findings present   Patient is medically stable and ready for discharge home.  He is instructed to quarantine until all cultures and smears have returned, thus far negative.  His wheezing is much improved.  He will be placed on a prednisone taper, no need for antibiotics at discharge.  Did not qualify for oxygen on 6-minute walk test today.  Nebulizer machine ordered as well as treatments.  Encouraged to stop smoking.  Follow-up with PCP.  Discharge instructions reviewed with the patient and he expresses gratitude for his care.  Discharge home.   ADDITIONAL NON-ACUTE FINDINGS, OBSERVATIONS, FAMILY DISCUSSIONS, ETC. (When present):  General; fairly well-appearing 53 year old male Cardiovascular; regular rhythm, rate 60s to 70s Pulmonary; coarse with faint expiratory wheeze scattered, good air exchange, saturating 96% on room air, normal breathing effort Abdomen; soft, nontender, nondistended Extremities; moves all extremities spontaneously, no edema Neuro; alert and oriented x 3, no focal deficit ______________________________________  Mental Status On day of Discharge:  The patient is Alert and oriented to PERSON The patient is Alert And oriented to TIME The patient is Alert and oriented to LOCATION  CODE STATUS :                    Full Code   An advanced care planning discussion was  had with patient and/or patient's decisions maker (documented separately).  Patient discharged on Home O2? - no Patient discharged on home anticoagulant? -  no  Foley Catheter  status: None Central Line Status: NONE  Time Spent on Discharge I spent greater than 30 minutes counseling and coordinating care for the discharge of  this patient. The pt and I discussed the importance of outpatient follow-up as well as concerning signs and symptoms that would require immediate evaluation by a medical professional. The aforementioned conversation participants understand  and did show insight. I did use teachback to ensure understanding. The above participant/s is aware that not following the discussed plan, recommendations, and follow up can lead to severe negative effects on the patient's health, up to and including death.  Discharge Medications     Your Medication List     STOP taking these medications    cyclobenzaprine 10 MG tablet Commonly known as: FLEXERIL   fentaNYL  12 mcg/hr patch Commonly known as: DURAGESIC    NARCAN 4 mg/actuation nasal spray Generic drug: naloxone   oxyCODONE  10 mg Tr12 12 hr crush resistant ER/CR tablet Commonly known as: OxyCONTIN    oxyCODONE -acetaminophen  7.5-325 mg per tablet Commonly known as: PERCOCET   pregabalin 25 MG capsule Commonly known as: LYRICA   tizanidine 2 MG tablet Commonly known as: ZANAFLEX       START taking these medications    ipratropium-albuterol  0.5-2.5 mg/3 mL nebulizer Commonly known as: DUO-NEB Inhale 3 mL by nebulization every six (6) hours.   ipratropium-albuterol  0.5-2.5 mg/3 mL nebulizer Commonly known as: DUO-NEB Inhale 3 mL by nebulization every eight (8) hours as needed.   predniSONE 20 MG tablet Commonly known as: DELTASONE Take 2 tablets (40 mg total) by mouth daily for 3 days, THEN 1 tablet (20 mg total) daily for 3 days, THEN 0.5 tablets (10 mg total) daily for 4 days. Start taking on: May 24, 2024       CONTINUE taking these medications    albuterol  90 mcg/actuation inhaler Commonly known as: PROVENTIL  HFA;VENTOLIN  HFA 2 puffs every four (4) hours as needed.   atorvastatin 40 MG tablet Commonly known as: LIPITOR Take 1 tablet (40 mg total) by mouth daily.   buPROPion 100 MG 12 hr tablet Commonly known as:  Wellbutrin SR Take 1 tablet (100 mg total) by mouth Three (3) times a day after meals.   FLUoxetine 40 MG capsule Commonly known as: PROZAC Take 1 capsule (40 mg total) by mouth daily.   gabapentin  300 MG capsule Commonly known as: NEURONTIN  Take 1 capsule (300 mg total) by mouth two (2) times a day.   hydrOXYzine  25 MG tablet Commonly known as: ATARAX  TAKE 1 TABLET BY MOUTH UP TO TWICE DAILY AS NEEDED FOR SLEEP OR ANXIETY   traZODone  50 MG tablet Commonly known as: DESYREL  Take 2 tablets (100 mg total) by mouth nightly.       _____________________________________  Nutrition:                                  Diet Instructions     Discharge diet (specify)     Discharge Nutrition Therapy: Regular                     ___________________________________________  Discharge Instructions   Nutrition:                                  Diet Instructions     Discharge diet (specify)     Discharge Nutrition Therapy: Regular  Activity:                                   Activity Instructions     Activity as tolerated     Activity as tolerated         Appointments:                          Follow Up:                              Follow Up instructions and Outpatient Referrals    Call MD for:  difficulty breathing, headache or visual disturbances     Call MD for:  persistent dizziness or light-headedness     Call MD for:  persistent nausea or vomiting     Call MD for:  persistent nausea or vomiting     Call MD for:  severe uncontrolled pain     Call MD for:  severe uncontrolled pain     Call MD for: Temperature > 38.5 Celsius ( > 101.3 Fahrenheit)     Call MD for: Temperature > 38.5 Celsius ( > 101.3 Fahrenheit)     Discharge instructions         Allergies  Allergen Reactions  . Penicillins Anaphylaxis and Other (See Comments)    Tolerated Merrem 05/16/24     Past Medical History[1]  Past Surgical History[2]   Family History[3]   Current  Medications[4]  Imaging  CTA Chest W Contrast Result Date: 05/23/2024 Exam:  CT Angiogram Chest (Pulmonary Embolism Protocol), CT abdomen and pelvis with contrast  History:  Shortness of breath, cough, dizziness, evaluate for tuberculosis  Technique: Chest CTA with IV contrast (pulmonary embolism protocol). CT abdomen and pelvis with contrast. Chest CT specifically tailored to evaluate the pulmonary arteries for the presence of intraluminal thrombus.  Volumetric axial CT Maximum Intensity Projection (MIP) pulmonary angiographic images were generated at the scanner and sent to PACS for review. AEC (automated exposure control) and/or manual techniques such as size-specific kV and mAs are employed where appropriate to reduce radiation exposure for all CT exams.  Comparison:  None  Chest CT Findings:  PULMONARY ARTERIES: No acute pulmonary embolism. Pulmonary arteries normal caliber.  MEDIASTINUM: Great vessels enhance appropriately. No thoracic aortic aneurysm or dissection. Aberrant right subclavian artery anatomic variant noted at the aortic arch. The esophagus is unremarkable. No lymphadenopathy.  LUNGS: Heterogeneous ground glass density and subtle diffuse nodularity throughout the lungs. No pleural effusion or pneumothorax. There is central bronchial wall thickening.  HEPATOBILIARY:  Negative.  PANCREAS:  Negative.  SPLEEN:  Negative.  ADRENALS:  Negative.  KIDNEYS/URETERS:  Symmetric nephrograms.  VASCULAR:  No aortic aneurysm or dissection.  BOWEL/MESENTERY:  Normal appendix. Stomach and bowel are unremarkable.  PELVIS:  No pelvic free fluid. The bladder appears normal.  BONES/SOFT TISSUES:  Partially imaged anterior instrumented fusion of the lower cervical spine. Plate and screw fixation of the left acetabulum. Regional bones are otherwise intact. Superficial soft tissues are unremarkable.     1.    Subtle diffuse nodularity throughout both lungs with few scattered groundglass densities and central  bronchial wall thickening. Imaging features are nonspecific, consider infectious/inflammatory bronchiolitis versus atypical infection (including tuberculosis). No associated lymphadenopathy.  2.    No pulmonary embolus.  3.    No additional acute process in  the abdomen or pelvis.  Signed (Electronic Signature): 05/23/2024 6:32 PM Signed By: Emeline KATHEE Milroy, MD  CT Abdomen Pelvis W Contrast Result Date: 05/23/2024 Exam:  CT Angiogram Chest (Pulmonary Embolism Protocol), CT abdomen and pelvis with contrast  History:  Shortness of breath, cough, dizziness, evaluate for tuberculosis  Technique: Chest CTA with IV contrast (pulmonary embolism protocol). CT abdomen and pelvis with contrast. Chest CT specifically tailored to evaluate the pulmonary arteries for the presence of intraluminal thrombus.  Volumetric axial CT Maximum Intensity Projection (MIP) pulmonary angiographic images were generated at the scanner and sent to PACS for review. AEC (automated exposure control) and/or manual techniques such as size-specific kV and mAs are employed where appropriate to reduce radiation exposure for all CT exams.  Comparison:  None  Chest CT Findings:  PULMONARY ARTERIES: No acute pulmonary embolism. Pulmonary arteries normal caliber.  MEDIASTINUM: Great vessels enhance appropriately. No thoracic aortic aneurysm or dissection. Aberrant right subclavian artery anatomic variant noted at the aortic arch. The esophagus is unremarkable. No lymphadenopathy.  LUNGS: Heterogeneous ground glass density and subtle diffuse nodularity throughout the lungs. No pleural effusion or pneumothorax. There is central bronchial wall thickening.  HEPATOBILIARY:  Negative.  PANCREAS:  Negative.  SPLEEN:  Negative.  ADRENALS:  Negative.  KIDNEYS/URETERS:  Symmetric nephrograms.  VASCULAR:  No aortic aneurysm or dissection.  BOWEL/MESENTERY:  Normal appendix. Stomach and bowel are unremarkable.  PELVIS:  No pelvic free fluid. The bladder appears normal.   BONES/SOFT TISSUES:  Partially imaged anterior instrumented fusion of the lower cervical spine. Plate and screw fixation of the left acetabulum. Regional bones are otherwise intact. Superficial soft tissues are unremarkable.     1.    Subtle diffuse nodularity throughout both lungs with few scattered groundglass densities and central bronchial wall thickening. Imaging features are nonspecific, consider infectious/inflammatory bronchiolitis versus atypical infection (including tuberculosis). No associated lymphadenopathy.  2.    No pulmonary embolus.  3.    No additional acute process in the abdomen or pelvis.  Signed (Electronic Signature): 05/23/2024 6:32 PM Signed By: Emeline KATHEE Milroy, MD  XR Chest Portable Result Date: 05/23/2024 Exam:  Portable Chest  History:  Cough    Technique: Single frontal view of the chest.  Comparison:  None.    Findings:  No consolidative change, focal infiltrate or effusion. Mediastinal contours are unremarkable. Pulmonary vasculature appears normal.     No acute cardiopulmonary abnormality.      Signed (Electronic Signature): 05/23/2024 5:55 PM Signed By: Emeline KATHEE Milroy, MD  XR Abdomen 1 View Result Date: 05/19/2024 Exam:  Abdomen 1 view  History:  Abdominal pain.  Technique:  Abdomen, 1 view (supine)  Comparison:  None.  Findings:  Nonspecific bowel gas pattern. No obstruction. No pneumoperitoneum.  Left iliac and acetabular plate and screw fixation. No acute osseous abnormality.    Negative abdomen radiograph.    Signed (Electronic Signature): 05/19/2024 8:44 PM Signed By: Genia Rosebush, MD  CTA Chest W Contrast Result Date: 05/14/2024 EXAM: CTA chest with IV contrast-pulmonary embolism protocol  HISTORY:  Hypoxia  Technique: Chest CTA with IV contrast (pulmonary embolism protocol). This examination was specifically tailored to evaluate the pulmonary arteries for the presence of intraluminal thrombus.  Volumetric axial CT Maximum Intensity Projection (MIP) pulmonary  angiographic images were generated at the scanner and sent to PACS for review.   DLP:  AEC (automated exposure control) and/or manual techniques such as size-specific kV and mAs are employed where appropriate to reduce radiation exposure for  all CT exams.  COMPARISON:  Noncontrast CT 03/27/2024  FINDINGS:  Pulmonary Arteries: There is adequate contrast filling of the pulmonary arteries through the proximal segmental level. The mid and peripheral segmental pulmonary arteries are obscured by diffuse motion artifact. There are no intraluminal filling defects through the proximal segmental level.  Cardiac/mediastinum: There are scattered 3-8 mm mediastinal lymph nodes, slightly increased in size compared with the prior. There are no enlarged mediastinal lymph nodes. A lymph node in the superior right hilum measures 15 mm short axis, new compared with the prior. A lymph node in the mid right hilum measures 8 mm short axis. There are no enlarged left hilar lymph nodes.  Lungs and pleura: There are extensive heterogeneous and partially confluent nodular opacities throughout the lungs diffusely. These opacities have a slight upper lobe distribution. Areas of groundglass and slightly more confluent heterogeneous opacity identified in the central upper lobes and peripheral right middle lobe and left upper lobe lingula, and to a lesser extent the infrahilar left lower lobe.  The central airways are patent. The pleural spaces are normal.  Upper abdomen: The visualized portions of the upper abdomen are normal.  Bones and soft tissues: The regional bones and soft tissues are intact.     1.    Negative for central pulmonary embolism. The mid and peripheral pulmonary arteries are obscured by motion artifact. 2.    Extensive heterogeneous and partially confluent nodular opacities are identified throughout the lungs diffusely, with a slight upper lobe distribution. The appearance is most suspicious for interstitial/atypical  pneumonia, though pulmonary edema is also possible. The miliary distribution raises the possibility of mycobacterial infection, mycoplasma, fungal infection, and viral pneumonia. 3.    There are enlarged right hilar lymph nodes and borderline enlarged mediastinal lymph nodes, likely reactive.  Signed (Electronic Signature): 05/14/2024 4:20 PM Signed By: Debby LITTIE Willy MICKEY, MD  ECG 12 Lead Result Date: 05/14/2024 Sinus tachycardia Otherwise normal ECG When compared with ECG of 16-Oct-2022 16:34, T wave inversion no longer evident in Inferior leads Confirmed by Cherie Searle ([id number]) on 05/14/2024 2:29:24 PM  XR Chest Portable Result Date: 05/14/2024 Exam: Portable Chest  History:  Shortness of breath, cough  Technique: One frontal portable view  Comparison:  Noncontrast chest CT 08/28/2023  Findings:    Lungs: There are coarse heterogeneous reticular opacities throughout the lungs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

## 2024-05-24 NOTE — Nursing Note (Signed)
 Patient was provided discharge instructions; vital signs stable, no complaints of pain. Patient was advised to obtain medications from [facility] and instructed to discontinue the listed medications. Patient reported having a scheduled appointment with his primary care physician on Friday. Patient expressed difficulty tolerating gabapentin ; this nurse advised him to discuss this issue with Dr. Alston, his primary care provider. Patient was assisted in wheeling downstairs to his transportation for discharge. All discharge paperwork was provided, and the patient was given multiple masks to use during quarantine.

## 2024-05-29 NOTE — Care Plan (Signed)
         Transitions Subsequent Calls   Subjective/Objective: Since our last conversation, are you feeling better, same, or worse?: Better Patient concerns addressed today: Pt continues to c/o fatigue, decreased appetite, and hot sweats. He denies any chest discomfort, fever, HA, or SOB. He states he now has a nebulizer machine. TCM instructed pt to contact his PCP for any concerns or questions.  Have there been any medication changes since our last conversation?: N/A Barriers to patient care?: None Care Plan Task completed today: PNE Education, PNE symptoms, Tobacco cessation, Food/fluid Action Plan: Case Manager next follow-up date: 06/05/24 Intervention provided: Brief motivational interviewing, Supportive listening CC Note updated: Yes Encounter routed to: N/A Appointments reviewed: Appointment Info Comments: PCP 06/02/24 Patient completed TCM 30-Day Enrollment Program and agrees for referral to: N/A     DAWNYETTA L TAYLOR, RN

## 2024-05-31 NOTE — BH Treatment Plan (Signed)
 Poke with patient on May 31, 2024 to inform him by phone that his Aspergillus test was negative.  Patient appreciated the phone call.  He has a follow-up appointment with his PCP coming up on following, October 31, Friday.  He is complaining about ongoing constipation and having painful bowel movements.  I advised him to consider getting MiraLAX over-the-counter and over-the-counter Dulcolax oral or suppository medication depending on his preference.  Also asked him to keep good notes on how he is feeling and discuss it with his PCP coming up on Friday visit.  Patient understands and is agreeable.  Answered all questions.  Appreciative of the help that he received from me while in hospital.

## 2024-06-02 NOTE — Progress Notes (Signed)
 Spoke with patient approximately 5 PM on Friday, October 31 and explained that yeast specimen was seen in his sputum sample.  Patient saw his PCP today, and they said everything seemed to be fine. Although this is encouraging news, patient has been sick for approximately a month and his current antibiotic regimen may not address the yeast noted on today's sample results.  Advised him to coordinate care with his PCP as he may need antifungal added to his current regimen.  Patient understands and is agreeable.  He was reminded to come back to the ER if there is any further issues questions concerns or problems.  I will add patient's PCP to the results so they can help manage the patient better.

## 2024-06-05 NOTE — Care Plan (Signed)
    [facility] 30 DAY TRANSITION FOLLOW-UP CALL    Attempted to contact patient today to follow up in the 30 Day [facility] Transitions Program for Pneumonia. Patient unable to be contacted. Left message to return call.; 1st attempt.     DAWNYETTA L TAYLOR, RN

## 2024-06-06 NOTE — Care Plan (Signed)
         Transitions Subsequent Calls   Subjective/Objective: Since our last conversation, are you feeling better, same, or worse?: Same Patient concerns addressed today: Pt states he isn't feeling well today. Pt c/o weakness x2 days and intermittent SOB with activity. He states he was notified by hospital provider on yesterday lab results showing yeast in his sputum. He is scheduled to follow up with his PCP tomorrow to address results. TCM advised pt to return to ED/call 911 for worsened symptoms and/or difficulty breathing. Pt voiced understanding.  Have there been any medication changes since our last conversation?: N/A Barriers to patient care?: None Care Plan Task completed today: Immunization, Infection Prevention, Worsening/unresolved symptoms Action Plan: Case Manager next follow-up date: 06/09/24 Intervention provided: Brief motivational interviewing, Supportive listening CC Note updated: Yes Encounter routed to: PCP Appointments reviewed: Appointment Info Comments: PCP 06/07/24 Patient completed TCM 30-Day Enrollment Program and agrees for referral to: N/A     DAWNYETTA L TAYLOR, RN

## 2024-06-09 ENCOUNTER — Observation Stay (HOSPITAL_COMMUNITY)
Admission: EM | Admit: 2024-06-09 | Discharge: 2024-06-11 | Disposition: A | Discharge status: Home / Self Care | Attending: Internal Medicine | Admitting: Internal Medicine

## 2024-06-09 ENCOUNTER — Encounter (HOSPITAL_COMMUNITY): Payer: Self-pay | Admitting: *Deleted

## 2024-06-09 ENCOUNTER — Other Ambulatory Visit: Payer: Self-pay

## 2024-06-09 ENCOUNTER — Emergency Department (HOSPITAL_COMMUNITY)

## 2024-06-09 DIAGNOSIS — R918 Other nonspecific abnormal finding of lung field: Secondary | ICD-10-CM | POA: Insufficient documentation

## 2024-06-09 DIAGNOSIS — J441 Chronic obstructive pulmonary disease with (acute) exacerbation: Secondary | ICD-10-CM

## 2024-06-09 DIAGNOSIS — R0902 Hypoxemia: Secondary | ICD-10-CM | POA: Diagnosis not present

## 2024-06-09 DIAGNOSIS — I959 Hypotension, unspecified: Secondary | ICD-10-CM | POA: Diagnosis not present

## 2024-06-09 DIAGNOSIS — M7989 Other specified soft tissue disorders: Secondary | ICD-10-CM

## 2024-06-09 DIAGNOSIS — J189 Pneumonia, unspecified organism: Secondary | ICD-10-CM | POA: Insufficient documentation

## 2024-06-09 DIAGNOSIS — Z8701 Personal history of pneumonia (recurrent): Secondary | ICD-10-CM | POA: Diagnosis not present

## 2024-06-09 LAB — CBC WITH DIFFERENTIAL/PLATELET
Abs Immature Granulocytes: 0.03 K/uL (ref 0.00–0.07)
Basophils Absolute: 0 K/uL (ref 0.0–0.1)
Basophils Relative: 1 %
Eosinophils Absolute: 0.6 K/uL — ABNORMAL HIGH (ref 0.0–0.5)
Eosinophils Relative: 9 %
HCT: 35.3 % — ABNORMAL LOW (ref 39.0–52.0)
Hemoglobin: 11.7 g/dL — ABNORMAL LOW (ref 13.0–17.0)
Immature Granulocytes: 1 %
Lymphocytes Relative: 23 %
Lymphs Abs: 1.4 K/uL (ref 0.7–4.0)
MCH: 30.2 pg (ref 26.0–34.0)
MCHC: 33.1 g/dL (ref 30.0–36.0)
MCV: 91 fL (ref 80.0–100.0)
Monocytes Absolute: 0.5 K/uL (ref 0.1–1.0)
Monocytes Relative: 9 %
Neutro Abs: 3.5 K/uL (ref 1.7–7.7)
Neutrophils Relative %: 57 %
Platelets: 153 K/uL (ref 150–400)
RBC: 3.88 MIL/uL — ABNORMAL LOW (ref 4.22–5.81)
RDW: 12.8 % (ref 11.5–15.5)
WBC: 6 K/uL (ref 4.0–10.5)
nRBC: 0 % (ref 0.0–0.2)

## 2024-06-09 LAB — BASIC METABOLIC PANEL WITH GFR
Anion gap: 11 (ref 5–15)
BUN: 12 mg/dL (ref 6–20)
CO2: 28 mmol/L (ref 22–32)
Calcium: 9.3 mg/dL (ref 8.9–10.3)
Chloride: 102 mmol/L (ref 98–111)
Creatinine, Ser: 0.81 mg/dL (ref 0.61–1.24)
GFR, Estimated: >60 mL/min (ref >60)
Glucose, Bld: 92 mg/dL (ref 70–99)
Potassium: 3.8 mmol/L (ref 3.5–5.1)
Sodium: 141 mmol/L (ref 135–145)

## 2024-06-09 LAB — TROPONIN T, HIGH SENSITIVITY
Troponin T High Sensitivity: <15 ng/L (ref 0–19)
Troponin T High Sensitivity: <15 ng/L (ref 0–19)

## 2024-06-09 LAB — LACTIC ACID, PLASMA
Lactic Acid, Venous: 0.7 mmol/L (ref 0.5–1.9)
Lactic Acid, Venous: 0.6 mmol/L (ref 0.5–1.9)

## 2024-06-09 LAB — RESP PANEL BY RT-PCR (RSV, FLU A&B, COVID)  RVPGX2
Influenza A by PCR: NEGATIVE
Influenza B by PCR: NEGATIVE
Resp Syncytial Virus by PCR: NEGATIVE
SARS Coronavirus 2 by RT PCR: NEGATIVE

## 2024-06-09 LAB — TSH: TSH: 1.31 u[IU]/mL (ref 0.350–4.500)

## 2024-06-09 LAB — PRO BRAIN NATRIURETIC PEPTIDE: Pro Brain Natriuretic Peptide: 750 pg/mL — ABNORMAL HIGH (ref <300.0)

## 2024-06-09 LAB — PHOSPHORUS: Phosphorus: 3.4 mg/dL (ref 2.5–4.6)

## 2024-06-09 LAB — CORTISOL: Cortisol, Plasma: 4.9 ug/dL

## 2024-06-09 MED ORDER — LINACLOTIDE 145 MCG PO CAPS
145.0000 ug | ORAL_CAPSULE | Freq: Every day | ORAL | Status: DC
  Administered 2024-06-10 – 2024-06-11 (×2): 145 ug via ORAL
  Filled 2024-06-09 (×2): qty 1

## 2024-06-09 MED ORDER — FLUOXETINE HCL 10 MG PO CAPS
10.0000 mg | ORAL_CAPSULE | Freq: Every day | ORAL | Status: DC
  Administered 2024-06-10 – 2024-06-11 (×2): 10 mg via ORAL
  Filled 2024-06-09 (×2): qty 1

## 2024-06-09 MED ORDER — ORAL CARE MOUTH RINSE: 15.0000 mL | OROMUCOSAL | Status: DC | PRN

## 2024-06-09 MED ORDER — ENOXAPARIN SODIUM 40 MG/0.4ML IJ SOSY
40.0000 mg | PREFILLED_SYRINGE | INTRAMUSCULAR | Status: DC
  Administered 2024-06-09 – 2024-06-10 (×2): 40 mg via SUBCUTANEOUS
  Filled 2024-06-09 (×2): qty 0.4

## 2024-06-09 MED ORDER — HYDROXYZINE HCL 25 MG PO TABS: 25.0000 mg | ORAL_TABLET | Freq: Two times a day (BID) | ORAL | Status: DC | PRN

## 2024-06-09 MED ORDER — METHYLPREDNISOLONE SODIUM SUCC 40 MG IJ SOLR
40.0000 mg | Freq: Two times a day (BID) | INTRAMUSCULAR | Status: DC
  Administered 2024-06-09 – 2024-06-11 (×4): 40 mg via INTRAVENOUS
  Filled 2024-06-09 (×4): qty 1

## 2024-06-09 MED ORDER — DOCUSATE SODIUM 100 MG PO CAPS
100.0000 mg | ORAL_CAPSULE | Freq: Two times a day (BID) | ORAL | Status: DC
  Administered 2024-06-09 – 2024-06-10 (×2): 100 mg via ORAL
  Filled 2024-06-09 (×2): qty 1

## 2024-06-09 MED ORDER — IOHEXOL 350 MG/ML SOLN
75.0000 mL | Freq: Once | INTRAVENOUS | Status: AC | PRN
  Administered 2024-06-09: 75 mL via INTRAVENOUS

## 2024-06-09 MED ORDER — ONDANSETRON HCL 4 MG PO TABS: 4.0000 mg | ORAL_TABLET | Freq: Four times a day (QID) | ORAL | Status: DC | PRN

## 2024-06-09 MED ORDER — LACTATED RINGERS IV BOLUS
250.0000 mL | Freq: Once | INTRAVENOUS | Status: AC
  Administered 2024-06-09: 250 mL via INTRAVENOUS

## 2024-06-09 MED ORDER — TRAZODONE HCL 50 MG PO TABS
50.0000 mg | ORAL_TABLET | Freq: Every evening | ORAL | Status: DC | PRN
  Administered 2024-06-09 – 2024-06-10 (×2): 50 mg via ORAL
  Filled 2024-06-09 (×2): qty 1

## 2024-06-09 MED ORDER — ONDANSETRON HCL 4 MG/2ML IJ SOLN: 4.0000 mg | Freq: Four times a day (QID) | INTRAMUSCULAR | Status: DC | PRN

## 2024-06-09 MED ORDER — SODIUM CHLORIDE 0.9 % IV SOLN
500.0000 mg | INTRAVENOUS | Status: DC
  Administered 2024-06-09: 500 mg via INTRAVENOUS
  Filled 2024-06-09: qty 5

## 2024-06-09 MED ORDER — LACTATED RINGERS IV BOLUS
500.0000 mL | Freq: Once | INTRAVENOUS | Status: AC
  Administered 2024-06-09: 500 mL via INTRAVENOUS

## 2024-06-09 MED ORDER — TRAZODONE HCL 50 MG PO TABS: 50.0000 mg | ORAL_TABLET | ORAL | Status: DC | PRN

## 2024-06-09 MED ORDER — IPRATROPIUM-ALBUTEROL 0.5-2.5 (3) MG/3ML IN SOLN
3.0000 mL | Freq: Once | RESPIRATORY_TRACT | Status: AC
  Administered 2024-06-09: 3 mL via RESPIRATORY_TRACT
  Filled 2024-06-09: qty 3

## 2024-06-09 MED ORDER — PREGABALIN 25 MG PO CAPS
25.0000 mg | ORAL_CAPSULE | Freq: Two times a day (BID) | ORAL | Status: DC
  Administered 2024-06-09 – 2024-06-11 (×4): 25 mg via ORAL
  Filled 2024-06-09 (×4): qty 1

## 2024-06-09 MED ORDER — THIAMINE MONONITRATE 100 MG PO TABS
100.0000 mg | ORAL_TABLET | Freq: Every day | ORAL | Status: DC
  Administered 2024-06-09 – 2024-06-11 (×3): 100 mg via ORAL
  Filled 2024-06-09 (×3): qty 1

## 2024-06-09 MED ORDER — IPRATROPIUM-ALBUTEROL 0.5-2.5 (3) MG/3ML IN SOLN: 3.0000 mL | Freq: Four times a day (QID) | RESPIRATORY_TRACT | Status: DC | PRN

## 2024-06-09 MED ORDER — NICOTINE 21 MG/24HR TD PT24
21.0000 mg | MEDICATED_PATCH | Freq: Every day | TRANSDERMAL | Status: DC
  Administered 2024-06-09 – 2024-06-11 (×3): 21 mg via TRANSDERMAL
  Filled 2024-06-09 (×3): qty 1

## 2024-06-09 MED ORDER — PANTOPRAZOLE SODIUM 40 MG PO TBEC
40.0000 mg | DELAYED_RELEASE_TABLET | Freq: Every day | ORAL | Status: DC
  Administered 2024-06-09 – 2024-06-11 (×3): 40 mg via ORAL
  Filled 2024-06-09 (×3): qty 1

## 2024-06-09 MED ORDER — BUPROPION HCL ER (XL) 150 MG PO TB24
150.0000 mg | ORAL_TABLET | Freq: Every morning | ORAL | Status: DC
  Administered 2024-06-10 – 2024-06-11 (×2): 150 mg via ORAL
  Filled 2024-06-09 (×2): qty 1

## 2024-06-09 MED ORDER — DM-GUAIFENESIN ER 30-600 MG PO TB12
1.0000 | ORAL_TABLET | Freq: Two times a day (BID) | ORAL | Status: DC
  Administered 2024-06-09 – 2024-06-11 (×5): 1 via ORAL
  Filled 2024-06-09 (×6): qty 1

## 2024-06-09 MED ORDER — METHYLPREDNISOLONE SODIUM SUCC 125 MG IJ SOLR
125.0000 mg | Freq: Once | INTRAMUSCULAR | Status: AC
  Administered 2024-06-09: 125 mg via INTRAVENOUS
  Filled 2024-06-09: qty 2

## 2024-06-09 MED ORDER — ACETAMINOPHEN 650 MG RE SUPP: 650.0000 mg | Freq: Four times a day (QID) | RECTAL | Status: DC | PRN

## 2024-06-09 MED ORDER — ACETAMINOPHEN 325 MG PO TABS
650.0000 mg | ORAL_TABLET | Freq: Four times a day (QID) | ORAL | Status: DC | PRN
  Administered 2024-06-09 – 2024-06-10 (×3): 650 mg via ORAL
  Filled 2024-06-09 (×3): qty 2

## 2024-06-09 MED ORDER — DOXYCYCLINE HYCLATE 100 MG PO TABS
100.0000 mg | ORAL_TABLET | Freq: Two times a day (BID) | ORAL | Status: DC
  Administered 2024-06-10 – 2024-06-11 (×3): 100 mg via ORAL
  Filled 2024-06-09 (×3): qty 1

## 2024-06-09 MED ORDER — SODIUM CHLORIDE 0.9 % IV SOLN: INTRAVENOUS | Status: DC

## 2024-06-09 MED ORDER — BUDESONIDE 0.5 MG/2ML IN SUSP
0.5000 mg | Freq: Two times a day (BID) | RESPIRATORY_TRACT | Status: DC
  Administered 2024-06-09 – 2024-06-11 (×4): 0.5 mg via RESPIRATORY_TRACT
  Filled 2024-06-09 (×4): qty 2

## 2024-06-09 MED ORDER — BUPRENORPHINE HCL-NALOXONE HCL 8-2 MG SL SUBL
2.0000 | SUBLINGUAL_TABLET | Freq: Every day | SUBLINGUAL | Status: DC
  Administered 2024-06-09 – 2024-06-11 (×3): 2 via SUBLINGUAL
  Filled 2024-06-09 (×3): qty 2

## 2024-06-09 MED ORDER — SODIUM CHLORIDE 0.9 % IV SOLN
1.0000 g | Freq: Once | INTRAVENOUS | Status: AC
  Administered 2024-06-09: 1 g via INTRAVENOUS
  Filled 2024-06-09: qty 10

## 2024-06-09 MED ORDER — FOLIC ACID 1 MG PO TABS
1.0000 mg | ORAL_TABLET | Freq: Every day | ORAL | Status: DC
  Administered 2024-06-09 – 2024-06-11 (×3): 1 mg via ORAL
  Filled 2024-06-09 (×3): qty 1

## 2024-06-09 MED ORDER — POLYETHYLENE GLYCOL 3350 17 G PO PACK
17.0000 g | PACK | Freq: Every day | ORAL | Status: DC
  Administered 2024-06-09: 17 g via ORAL
  Filled 2024-06-09: qty 1

## 2024-06-09 MED ORDER — ARFORMOTEROL TARTRATE 15 MCG/2ML IN NEBU
15.0000 ug | INHALATION_SOLUTION | Freq: Two times a day (BID) | RESPIRATORY_TRACT | Status: DC
  Administered 2024-06-09 – 2024-06-11 (×4): 15 ug via RESPIRATORY_TRACT
  Filled 2024-06-09 (×4): qty 2

## 2024-06-09 NOTE — Plan of Care (Signed)

## 2024-06-09 NOTE — ED Notes (Signed)
 Patient transported to CT

## 2024-06-09 NOTE — ED Notes (Signed)
 ED Provider at bedside.

## 2024-06-09 NOTE — H&P (Signed)
 History and Physical    Patient: Frank Page FMW:[id number] DOB: 04/14/1971 DOA: 06/09/2024 DOS: the patient was seen and examined on 06/09/2024 PCP: [facility]   Patient coming from: Home  Chief Complaint:  Chief Complaint  Patient presents with   Shortness of Breath   HPI: CLINTON DRAGONE is a 53 y.o. male with medical history significant of COPD, history of alcohol  abuse (patient reporting almost complete abstinence at the moment), tobacco abuse, chronic pain syndrome, hypertension, thrombocytopenia and recent extensive hospitalization secondary to pneumonia and COPD exacerbation (to [facility]).  Patient reports that at discharge she was supposed to be on oxygen supplementation but that was never arranged.  Patient has noticed ongoing constipation/bloating sensation and also swelling on his lower extremities.  He has dyspnea on exertion and during today's evaluation by PCP was found to be hypoxic and hypotensive.  In the ED he was afebrile, WBCs within normal limits and chest x-ray/CT angiogram of the chest demonstrating atelectatic changes and resolving patchy infiltrate, no pulmonary embolism and chronic emphysematous changes.  Steroids, nebulizer treatment, one-time dose of antibiotics and oxygen supplementation were provided.  Patient received some fluid resuscitation and [facility] have been contacted to place patient in the hospital for further evaluation and management.  Review of Systems: As mentioned in the history of present illness. All other systems reviewed and are negative. Past Medical History:  Diagnosis Date   Alcohol  abuse 09/05/2013   Alcohol  withdrawal (HCC) 09/05/2013   History of alcohol  withdrawal seizures   Anxiety    Chronic pain syndrome 09/05/2013   Dyspnea    Herniated disc    Nonunion of fracture    Psoriasis    Thrombocytopenia, unspecified 09/05/2013   Past Surgical History:  Procedure Laterality Date   ANTERIOR CERVICAL  DECOMP/DISCECTOMY FUSION N/A 11/22/2020   Procedure: Cervical Five-Six Cervical Six-Seven Anterior Cervical Decompression/Discectomy/Fusion;  Surgeon: Gillie Duncans, MD;  Location: [facility] OR;  Service: Neurosurgery;  Laterality: N/A;   HARDWARE REMOVAL Right 02/22/2023   Procedure: REMOVAL OF RIGHT TIBIAL INTERLOCK SCREW;  Surgeon: Barbarann Oneil BROCKS, MD;  Location: [facility];  Service: Orthopedics;  Laterality: Right;   HERNIA REPAIR     JOINT REPLACEMENT     ORIF FEMORAL SHAFT FRACTURE W/ PLATES AND SCREWS     Social History:  reports that he has been smoking cigars. He has never used smokeless tobacco. He reports current alcohol  use of about 168.0 standard drinks of alcohol  per week. He reports current drug use. Drug: Marijuana.  Allergies  Allergen Reactions   Penicillins Anaphylaxis    History reviewed. No pertinent family history.  Prior to Admission medications   Medication Sig Start Date End Date Taking? Authorizing Provider  albuterol  (VENTOLIN  HFA) 108 (90 Base) MCG/ACT inhaler SMARTSIG:1 Puff(s) Via Inhaler Every 4 Hours PRN 12/08/22   [provider]  buPROPion (WELLBUTRIN XL) 150 MG 24 hr tablet Take 150 mg by mouth every morning. 12/15/22   [provider]  FLUoxetine (PROZAC) 10 MG capsule Take 10 mg by mouth daily. 12/15/22   [provider]  hydrOXYzine  (ATARAX ) 25 MG tablet Take 25 mg by mouth 2 (two) times daily as needed. 12/15/22   [provider]  nicotine  (NICODERM CQ  - DOSED IN MG/24 HOURS) 21 mg/24hr patch Place 1 patch (21 mg total) onto the skin daily at 6 (six) AM. 08/15/22   Marry Clamp, MD  nicotine  polacrilex (NICORETTE ) 4 MG gum Take 1 each (4 mg total) by  mouth as needed for smoking cessation. 08/14/22   Marry Clamp, MD  oxyCODONE -acetaminophen  (PERCOCET) 7.5-325 MG tablet Take 1 tablet by mouth 3 (three) times daily as needed for moderate pain. 07/22/22   [provider]  pregabalin (LYRICA) 25 MG capsule  Take 25 mg by mouth 2 (two) times daily. 12/22/22   [provider]  tiZANidine (ZANAFLEX) 4 MG tablet Take 4 mg by mouth 3 (three) times daily as needed. 12/22/22   [provider]  traZODone  (DESYREL ) 50 MG tablet Take 1 tablet (50 mg total) by mouth every other day as needed for sleep. 08/14/22   Marry Clamp, MD  triamcinolone  cream (KENALOG ) 0.5 % Apply 1 Application topically 2 (two) times daily. 12/07/22   [provider]  Vitamin D, Ergocalciferol, 50000 units CAPS Take 1 capsule by mouth once a week. 12/07/22   [provider]  dicyclomine  (BENTYL ) 20 MG tablet Take 1 tablet (20 mg total) by mouth 3 (three) times daily before meals. 07/12/20 09/28/20  Haze Lonni PARAS, MD  DULoxetine  (CYMBALTA ) 30 MG capsule Take 1 capsule (30 mg total) by mouth daily. For depression 09/05/14 09/28/20  Rankin, Shuvon B, NP  gabapentin  (NEURONTIN ) 100 MG capsule Take 2 capsules (200 mg total) by mouth 2 (two) times daily. For agitation 09/05/14 09/28/20  Rankin, Shuvon B, NP  sucralfate  (CARAFATE ) 1 g tablet Take 1 tablet (1 g total) by mouth 4 (four) times daily -  with meals and at bedtime. 07/12/20 09/28/20  Haze Lonni PARAS, MD    Physical Exam: Vitals:   06/09/24 1348 06/09/24 1349 06/09/24 1400 06/09/24 1522  BP:   106/65 115/70  Pulse: 75 69 75 76  Resp: 17 16 19 16   Temp:    99 F (37.2 C)  TempSrc:    Oral  SpO2: 94% 94% 95% 96%  Weight:      Height:       General exam: Alert, awake, oriented x 3; no chest pain, no nausea, no vomiting.  Demonstrating mild difficulty speaking in full sentences. Respiratory system: 2 L nasal Cram supplementation in place, positive expiratory wheezing and fair air movement appreciated on exam.  Scattered rhonchi bilaterally has been noticed. Cardiovascular system:RRR. No murmurs, rubs, gallops. Gastrointestinal system: Abdomen is nondistended, soft and nontender. No organomegaly or masses felt. Normal bowel sounds  heard. Central nervous system: Alert and oriented. No focal neurological deficits. Extremities: No stenosis or clubbing; trace edema appreciated bilaterally. Skin: No petechiae. Psychiatry: Judgement and insight appear normal.  For Affect.  Data Reviewed: Basic metabolic panel: Sodium 141, potassium 3.8, chloride 102, bicarb 28, BUN 12, creatinine 0.81 and GFR >60 Lactic acid: 0.7 >> 0.8 proBNP: 750 CBC: White blood cell 6.0, hemoglobin 11.7 and platelet count 153K Respiratory panel by PCR: Negative for COVID, influenza and RSV High sensitive troponin: <15  X 2   Assessment and Plan: 1-COPD exacerbation/Bronchiectasis and hypoxia - 2 L nasal cannula supplementation has been applying - Patient will be treated with steroids, mucolytics, nebulizer management using: Pulmicort/Brovana and DuoNeb. - Flutter valve has been ordered -Patient started on oral doxycycline for bronchiectasis. - Follow clinical response. - Once stable will check desaturation screening and arrange for home oxygen.  2-history of hypertension; presenting with low blood pressure - Gentle fluid resuscitation will be provided - Cortisol level will be checked - Patient at high risk for adrenal insufficiency after extensive acute treatment with steroids as per patient reports without tapering process. - Holding diuretics - Will check TSH  and 2D echo.  3-GERD - Continue PPI.  4-chronic pain syndrome - Resume home analgesic therapy.  5-depression/anxiety - No suicidal ideation appreciated - Will resume home psychotropic agents.  6-tobacco/alcohol  abuse - Cessation counseling provided - Nicotine  patch has been ordered - Patient reports since previous admission almost complete abstinence - No signs of withdrawal currently appreciated - Will provide thiamine  and folic acid .   Advance Care Planning:   Code Status: Full Code   Consults: None  Family Communication: Brother at bedside.  Severity of  Illness: The appropriate patient status for this patient is OBSERVATION. Observation status is judged to be reasonable and necessary in order to provide the required intensity of service to ensure the patient's safety. The patient's presenting symptoms, physical exam findings, and initial radiographic and laboratory data in the context of their medical condition is felt to place them at decreased risk for further clinical deterioration. Furthermore, it is anticipated that the patient will be medically stable for discharge from the hospital within 2 midnights of admission.   Author: Eric Nunnery, MD 06/09/2024 6:46 PM  For on call review [email or web address].

## 2024-06-09 NOTE — ED Provider Notes (Signed)
 [place] EMERGENCY DEPARTMENT AT [facility] Provider Note   CSN: [id number] Arrival date & time: 06/09/24  1128     Patient presents with: Shortness of Breath   Frank Page is a 53 y.o. male.   52 year old male presents for evaluation of shortness of breath.  He was recently admitted at [facility] for pneumonia.  TB was ruled out and he was found to have Candida pneumonia.  Was treated with IV antibiotics and antifungals while in the hospital.  He improved while there and it was recommended he go home on oxygen, however he did not go and pick up the tank.  Patient states over the last couple weeks he has been significantly more short of breath, noticed some leg swelling and he feels short of breath even at rest.  Was seen by his primary care doctor multiple times this week and today was in the office and sent to the ER for low blood pressure, hypoxia with oxygen saturation in the 80s.  Patient does still smoke and he does have a history of COPD.  Denies any cough, fevers, back pain, chest pain, nausea or vomiting or any other symptoms or concerns at this time.   Shortness of Breath Associated symptoms: no abdominal pain, no chest pain, no cough, no ear pain, no fever, no rash, no sore throat and no vomiting        Prior to Admission medications   Medication Sig Start Date End Date Taking? Authorizing Provider  albuterol  (VENTOLIN  HFA) 108 (90 Base) MCG/ACT inhaler SMARTSIG:1 Puff(s) Via Inhaler Every 4 Hours PRN 12/08/22   [provider]  buPROPion (WELLBUTRIN XL) 150 MG 24 hr tablet Take 150 mg by mouth every morning. 12/15/22   [provider]  FLUoxetine (PROZAC) 10 MG capsule Take 10 mg by mouth daily. 12/15/22   [provider]  hydrOXYzine  (ATARAX ) 25 MG tablet Take 25 mg by mouth 2 (two) times daily as needed. 12/15/22   [provider]  nicotine  (NICODERM CQ  - DOSED IN MG/24 HOURS) 21 mg/24hr patch Place 1 patch (21 mg total) onto the  skin daily at 6 (six) AM. 08/15/22   Marry Clamp, MD  nicotine  polacrilex (NICORETTE ) 4 MG gum Take 1 each (4 mg total) by mouth as needed for smoking cessation. 08/14/22   Marry Clamp, MD  oxyCODONE -acetaminophen  (PERCOCET) 7.5-325 MG tablet Take 1 tablet by mouth 3 (three) times daily as needed for moderate pain. 07/22/22   [provider]  pregabalin (LYRICA) 25 MG capsule Take 25 mg by mouth 2 (two) times daily. 12/22/22   [provider]  tiZANidine (ZANAFLEX) 4 MG tablet Take 4 mg by mouth 3 (three) times daily as needed. 12/22/22   [provider]  traZODone  (DESYREL ) 50 MG tablet Take 1 tablet (50 mg total) by mouth every other day as needed for sleep. 08/14/22   Marry Clamp, MD  triamcinolone  cream (KENALOG ) 0.5 % Apply 1 Application topically 2 (two) times daily. 12/07/22   [provider]  Vitamin D, Ergocalciferol, 50000 units CAPS Take 1 capsule by mouth once a week. 12/07/22   [provider]  dicyclomine  (BENTYL ) 20 MG tablet Take 1 tablet (20 mg total) by mouth 3 (three) times daily before meals. 07/12/20 09/28/20  Haze Lonni PARAS, MD  DULoxetine  (CYMBALTA ) 30 MG capsule Take 1 capsule (30 mg total) by mouth daily. For depression 09/05/14 09/28/20  Rankin, Shuvon B, NP  gabapentin  (NEURONTIN ) 100 MG capsule Take 2 capsules (  200 mg total) by mouth 2 (two) times daily. For agitation 09/05/14 09/28/20  Rankin, Shuvon B, NP  sucralfate  (CARAFATE ) 1 g tablet Take 1 tablet (1 g total) by mouth 4 (four) times daily -  with meals and at bedtime. 07/12/20 09/28/20  Haze Lonni PARAS, MD    Allergies: Penicillins    Review of Systems  Constitutional:  Positive for fatigue. Negative for chills and fever.  HENT:  Negative for ear pain and sore throat.   Eyes:  Negative for pain and visual disturbance.  Respiratory:  Positive for shortness of breath. Negative for cough.   Cardiovascular:  Negative for chest pain and palpitations.   Gastrointestinal:  Negative for abdominal pain and vomiting.  Genitourinary:  Negative for dysuria and hematuria.  Musculoskeletal:  Negative for arthralgias and back pain.  Skin:  Negative for color change and rash.  Neurological:  Positive for light-headedness. Negative for seizures and syncope.  All other systems reviewed and are negative.   Updated Vital Signs BP 115/70   Pulse 76   Temp 99 F (37.2 C) (Oral)   Resp 16   Ht 5' 2 (1.575 m)   Wt 69.9 kg   SpO2 96%   BMI 28.17 kg/m   Physical Exam Vitals and nursing note reviewed.  Constitutional:      General: He is not in acute distress.    Appearance: He is well-developed. He is ill-appearing.     Comments: Chronically ill-appearing  HENT:     Head: Normocephalic and atraumatic.  Eyes:     Conjunctiva/sclera: Conjunctivae normal.  Cardiovascular:     Rate and Rhythm: Normal rate and regular rhythm.     Heart sounds: No murmur heard. Pulmonary:     Effort: Pulmonary effort is normal. No respiratory distress.     Breath sounds: Wheezing and rhonchi present.  Abdominal:     Palpations: Abdomen is soft.     Tenderness: There is no abdominal tenderness.  Musculoskeletal:        General: No swelling.     Cervical back: Neck supple.     Right lower leg: Edema present.     Left lower leg: Edema present.  Skin:    General: Skin is warm and dry.     Capillary Refill: Capillary refill takes less than 2 seconds.     Coloration: Skin is pale.  Neurological:     General: No focal deficit present.     Mental Status: He is alert.  Psychiatric:        Mood and Affect: Mood normal.     (all labs ordered are listed, but only abnormal results are displayed) Labs Reviewed  PRO BRAIN NATRIURETIC PEPTIDE - Abnormal; Notable for the following components:      Result Value   Pro Brain Natriuretic Peptide 750.0 (*)    All other components within normal limits  CBC WITH DIFFERENTIAL/PLATELET - Abnormal; Notable for the  following components:   RBC 3.88 (*)    Hemoglobin 11.7 (*)    HCT 35.3 (*)    Eosinophils Absolute 0.6 (*)    All other components within normal limits  RESP PANEL BY RT-PCR (RSV, FLU A&B, COVID)  RVPGX2  CULTURE, BLOOD (ROUTINE X 2)  CULTURE, BLOOD (ROUTINE X 2)  BASIC METABOLIC PANEL WITH GFR  LACTIC ACID, PLASMA  LACTIC ACID, PLASMA  CORTISOL  TSH  PHOSPHORUS  HIV ANTIBODY (ROUTINE TESTING W REFLEX)  TROPONIN T, HIGH SENSITIVITY  TROPONIN T, HIGH SENSITIVITY  EKG: EKG Interpretation Date/Time:  Friday June 09 2024 11:38:04 EST Ventricular Rate:  71 PR Interval:  135 QRS Duration:  83 QT Interval:  412 QTC Calculation: 448 R Axis:   72  Text Interpretation: Sinus rhythm Comapred to prior EKG from 12/07/2023 Confirmed by Gennaro Bouchard ([id number]) on 06/09/2024 11:42:22 AM  Radiology: CT Angio Chest Pulmonary Embolism (PE) W or WO Contrast Result Date: 06/09/2024 EXAM: CTA CHEST 06/09/2024 01:41:42 PM TECHNIQUE: CTA of the chest was performed without and with the administration of 75 mL of iohexol  (OMNIPAQUE ) 350 MG/ML intravenous contrast. Multiplanar reformatted images are provided for review. MIP images are provided for review. Automated exposure control, iterative reconstruction, and/or weight based adjustment of the mA/kV was utilized to reduce the radiation dose to as low as reasonably achievable. COMPARISON: None available. CLINICAL HISTORY: SOB, recent hospital stay, leg swelling, hx of PNA. FINDINGS: PULMONARY ARTERIES: Pulmonary arteries are adequately opacified for evaluation. No acute pulmonary embolus. Main pulmonary artery is normal in caliber. MEDIASTINUM: The heart and pericardium demonstrate no acute abnormality. Faint atheromatous vascular calcifications in the left anterior descending coronary artery (LAD). Aberrant right subclavian artery passes behind the esophagus. There is no acute abnormality of the thoracic aorta. LYMPH NODES: Conglomerate right hilar  adenopathy 1.3 cm in diameter, image 64, series 5. Upper normal size left hilar and bilateral infrahilar lymph nodes. LUNGS AND PLEURA: Patchy airspace opacities favoring the right upper lobe but also with scattered ground glass and faint airspace opacities in the right lower lobe, left lower lobe, and left upper lobe. Mild bilateral airway thickening. This pattern can be seen in pulmonary hemorrhage, atypical pneumonia, acute eosinophilic pneumonia, acute hypersensitivity pneumonitis, or in pulmonary edema (although no cardiomegaly is present to indicate cardiogenic pulmonary edema). Keeping the right greater than left nodal prominence, atypical pneumonia is favored. No evidence of pleural effusion or pneumothorax. UPPER ABDOMEN: Limited images of the upper abdomen are unremarkable. SOFT TISSUES AND BONES: No acute bone or soft tissue abnormality. IMPRESSION: 1. No pulmonary embolism. 2. Patchy multifocal airspace opacities with mild bilateral airway thickening; atypical pneumonia is favored. 3. Right hilar adenopathy measuring 1.3 cm, with upper-normal left hilar and bilateral infrahilar lymph nodes. Electronically signed by: Ryan Salvage MD 06/09/2024 03:17 PM EST RP Workstation: [id number]   DG Chest Port 1 View Result Date: 06/09/2024 EXAM: 1 VIEW(S) XRAY OF THE CHEST 06/09/2024 12:00:00 PM COMPARISON: Chest radiograph 08/11/2022. CLINICAL HISTORY: SOB (shortness of breath). FINDINGS: LUNGS AND PLEURA: Patchy opacities over the right upper lobe which could reflect atelectasis versus infiltrate. No pulmonary edema. No pleural effusion. No pneumothorax. HEART AND MEDIASTINUM: No acute abnormality of the cardiac and mediastinal silhouettes. BONES AND SOFT TISSUES: Partially visualized lower cervical spine fusion hardware. IMPRESSION: 1. Patchy right upper lobe opacities, suspicious for atelectasis versus pneumonia. Electronically signed by: Donnice Mania MD 06/09/2024 12:59 PM EST RP Workstation: [id number]      Procedures   Medications Ordered in the ED  azithromycin (ZITHROMAX) 500 mg in sodium chloride  0.9 % 250 mL IVPB (500 mg Intravenous New Bag/Given 06/09/24 1403)  methylPREDNISolone  sodium succinate (SOLU-MEDROL ) 40 mg/mL injection 40 mg (has no administration in time range)  budesonide (PULMICORT) nebulizer solution 0.5 mg (has no administration in time range)  arformoterol (BROVANA) nebulizer solution 15 mcg (has no administration in time range)  ipratropium-albuterol  (DUONEB) 0.5-2.5 (3) MG/3ML nebulizer solution 3 mL (has no administration in time range)  pantoprazole  (PROTONIX ) EC tablet 40 mg (has no administration in time range)  dextromethorphan-guaiFENesin (MUCINEX DM)  30-600 MG per 12 hr tablet 1 tablet (has no administration in time range)  nicotine  (NICODERM CQ  - dosed in mg/24 hours) patch 21 mg (has no administration in time range)  enoxaparin (LOVENOX) injection 40 mg (has no administration in time range)  0.9 %  sodium chloride  infusion (has no administration in time range)  acetaminophen  (TYLENOL ) tablet 650 mg (has no administration in time range)    Or  acetaminophen  (TYLENOL ) suppository 650 mg (has no administration in time range)  ondansetron  (ZOFRAN ) tablet 4 mg (has no administration in time range)    Or  ondansetron  (ZOFRAN ) injection 4 mg (has no administration in time range)  ipratropium-albuterol  (DUONEB) 0.5-2.5 (3) MG/3ML nebulizer solution 3 mL (3 mLs Nebulization Given 06/09/24 1302)  methylPREDNISolone  sodium succinate (SOLU-MEDROL ) 125 mg/2 mL injection 125 mg (125 mg Intravenous Given 06/09/24 1159)  lactated ringers  bolus 250 mL (0 mLs Intravenous Stopped 06/09/24 1247)  cefTRIAXone (ROCEPHIN) 1 g in sodium chloride  0.9 % 100 mL IVPB (1 g Intravenous New Bag/Given 06/09/24 1341)  iohexol  (OMNIPAQUE ) 350 MG/ML injection 75 mL (75 mLs Intravenous Contrast Given 06/09/24 1317)  lactated ringers  bolus 500 mL (0 mLs Intravenous Stopped 06/09/24 1449)                                     Medical Decision Making Cardiac monitor interpretation: Sinus rhythm, no ectopy  Social determinants of health: Alcohol  abuse, tobacco abuse  Patient here for hypoxia.  Was sent in by primary care doctor for hypotension hypoxia.  Oxygen saturation in the 80s on arrival blood pressure also in the 80s systolic.  Was given IV fluids with improvement in his blood pressure.  I gave him 750 cc and an improvement of his blood pressure to the 110s.  Was placed on 2 L nasal cannula and oxygen saturation improved to 94%.  Also given a DuoNeb and steroids as he does have a history of COPD.  Troponin is negative but BNP is slightly elevated.  Chest x-ray appears fairly unremarkable at this time.  Patient was recently at [facility] for pneumonia and sent home but never picked up any oxygen.  I will start him on antibiotics for pneumonia as his x-ray looks like he has a persistent pneumonia and there is some atypical looking pneumonia on his CT.  Discussed patient's case with hospitalist and patient be admitted for further workup and management.  All results and plan discussed with patient and family at bedside.  They are agreeable with plan for admission.  Problems Addressed: COPD exacerbation (HCC): acute illness or injury History of pneumonia: chronic illness or injury with exacerbation, progression, or side effects of treatment Hypotension, unspecified hypotension type: undiagnosed new problem with uncertain prognosis Hypoxia: undiagnosed new problem with uncertain prognosis Leg swelling: undiagnosed new problem with uncertain prognosis  Amount and/or Complexity of Data Reviewed Independent Historian: caregiver    Details: Daughter at bedside helps provide history regarding patient's care at [facility] and primary care visit today.  Daughter states that patient was sent in for hypoxia and hypotension  External Data Reviewed: notes.    Details: Prior hospital stay from [facility] reviewed and  patient was treated for pneumonia there, had a negative TB test.  Was also treated for COPD Labs: ordered. Decision-making details documented in ED Course.    Details: Ordered and reviewed by me and BNP is slightly elevated Radiology: ordered and independent interpretation performed.  Decision-making details documented in ED Course.    Details: Ordered and inter by me independently radiology Chest x-ray: Shows persistent right upper lobe opacity  CT angiogram of the chest shows no PE but does show atypical pneumonia ECG/medicine tests: ordered and independent interpretation performed. Decision-making details documented in ED Course.    Details: Ordered and interpreted by me in the absence of cardiology and shows sinus rhythm, no STEMI or significant change when compared to prior Discussion of management or test interpretation with external provider(s): Dr. Ricky - hospitalist -I spoke with him on the phone regarding the patient's case and he will meet the patient for further workup and management  Risk OTC drugs. Prescription drug management. Parenteral controlled substances. Drug therapy requiring intensive monitoring for toxicity. Decision regarding hospitalization. Diagnosis or treatment significantly limited by social determinants of health. Risk Details: CRITICAL CARE Performed by: Duwaine LITTIE Fusi   Total critical care time: 35 minutes  Critical care time was exclusive of separately billable procedures and treating other patients.  Critical care was necessary to treat or prevent imminent or life-threatening deterioration.  Critical care was time spent personally by me on the following activities: development of treatment plan with patient and/or surrogate as well as nursing, discussions with consultants, evaluation of patient's response to treatment, examination of patient, obtaining history from patient or surrogate, ordering and performing treatments and interventions, ordering  and review of laboratory studies, ordering and review of radiographic studies, pulse oximetry and re-evaluation of patient's condition.   Critical Care Total time providing critical care: 35 minutes     Final diagnoses:  Hypoxia  COPD exacerbation (HCC)  Hypotension, unspecified hypotension type  History of pneumonia  Leg swelling    ED Discharge Orders     None          Fusi Duwaine LITTIE, DO 06/09/24 1531

## 2024-06-09 NOTE — ED Triage Notes (Signed)
 Pt with recent admission at [facility] for SOB x 2 weeks, coughing up blood and night sweats.  Pt's daughter states TB was negative but has yeast in his lungs. Pt c/o dizziness and SOB.

## 2024-06-10 ENCOUNTER — Observation Stay (HOSPITAL_COMMUNITY)

## 2024-06-10 DIAGNOSIS — R0609 Other forms of dyspnea: Secondary | ICD-10-CM | POA: Diagnosis not present

## 2024-06-10 LAB — BASIC METABOLIC PANEL WITH GFR
Anion gap: 11 (ref 5–15)
BUN: 16 mg/dL (ref 6–20)
CO2: 26 mmol/L (ref 22–32)
Calcium: 9.1 mg/dL (ref 8.9–10.3)
Chloride: 105 mmol/L (ref 98–111)
Creatinine, Ser: 0.7 mg/dL (ref 0.61–1.24)
GFR, Estimated: >60 mL/min (ref >60)
Glucose, Bld: 158 mg/dL — ABNORMAL HIGH (ref 70–99)
Potassium: 4 mmol/L (ref 3.5–5.1)
Sodium: 142 mmol/L (ref 135–145)

## 2024-06-10 LAB — ECHOCARDIOGRAM COMPLETE
Area-P 1/2: 5.62 cm2
Height: 62 in
S' Lateral: 4 cm
Weight: 2464 [oz_av]

## 2024-06-10 LAB — CBC
HCT: 33.4 % — ABNORMAL LOW (ref 39.0–52.0)
Hemoglobin: 11 g/dL — ABNORMAL LOW (ref 13.0–17.0)
MCH: 29.7 pg (ref 26.0–34.0)
MCHC: 32.9 g/dL (ref 30.0–36.0)
MCV: 90.3 fL (ref 80.0–100.0)
Platelets: 157 K/uL (ref 150–400)
RBC: 3.7 MIL/uL — ABNORMAL LOW (ref 4.22–5.81)
RDW: 12.3 % (ref 11.5–15.5)
WBC: 4.5 K/uL (ref 4.0–10.5)
nRBC: 0 % (ref 0.0–0.2)

## 2024-06-10 LAB — HEMOGLOBIN A1C
Hgb A1c MFr Bld: 5.5 % (ref 4.8–5.6)
Mean Plasma Glucose: 111.15 mg/dL

## 2024-06-10 LAB — HIV ANTIBODY (ROUTINE TESTING W REFLEX): HIV Screen 4th Generation wRfx: NONREACTIVE

## 2024-06-10 MED ORDER — OXYCODONE HCL 5 MG PO TABS
5.0000 mg | ORAL_TABLET | Freq: Four times a day (QID) | ORAL | Status: DC | PRN
  Administered 2024-06-10: 10 mg via ORAL
  Administered 2024-06-11: 5 mg via ORAL
  Administered 2024-06-11: 10 mg via ORAL
  Filled 2024-06-10: qty 2
  Filled 2024-06-10: qty 1
  Filled 2024-06-10 (×2): qty 2

## 2024-06-10 MED ORDER — POLYETHYLENE GLYCOL 3350 17 G PO PACK: 17.0000 g | PACK | Freq: Every day | ORAL | Status: DC | PRN

## 2024-06-10 NOTE — TOC CM/SW Note (Signed)
 Transition of Care [facility]) - Inpatient Brief Assessment   Patient Details  Name: Frank Page MRN: [id number] Date of Birth: 01-21-1971  [facility] CM/SW Contact:    Noreen KATHEE Pinal, LCSWA Phone Number: 06/10/2024, 11:55 AM   Clinical Narrative:  Inpatient Care Management (ICM) has reviewed patient and no other ICM needs have been identified at this time. We will continue to monitor patient advancement through interdisciplinary progression rounds. If new patient transition needs arise, please place a ICM consult.    Transition of Care Asessment: Insurance and Status: Insurance coverage has been reviewed Patient has primary care physician: Yes Home environment has been reviewed: Single Family Home Prior level of function:: Independent Prior/Current Home Services: No current home services Social Drivers of Health Review: SDOH reviewed no interventions necessary Readmission risk has been reviewed: Yes Transition of care needs: no transition of care needs at this time

## 2024-06-10 NOTE — Progress Notes (Signed)
 Progress Note   Patient: Frank Page FMW:[id number] DOB: May 16, 1971 DOA: 06/09/2024     0 DOS: the patient was seen and examined on 06/10/2024   Brief hospital admission narrative: SIMUEL STEBNER is a 53 y.o. male with medical history significant of COPD, history of alcohol  abuse (patient reporting almost complete abstinence at the moment), tobacco abuse, chronic pain syndrome, hypertension, thrombocytopenia and recent extensive hospitalization secondary to pneumonia and COPD exacerbation (to [facility]).  Patient reports that at discharge she was supposed to be on oxygen supplementation but that was never arranged.  Patient has noticed ongoing constipation/bloating sensation and also swelling on his lower extremities.  He has dyspnea on exertion and during today's evaluation by PCP was found to be hypoxic and hypotensive.   In the ED he was afebrile, WBCs within normal limits and chest x-ray/CT angiogram of the chest demonstrating atelectatic changes and resolving patchy infiltrate, no pulmonary embolism and chronic emphysematous changes.   Steroids, nebulizer treatment, one-time dose of antibiotics and oxygen supplementation were provided.  Patient received some fluid resuscitation and TR[facility] have been contacted to place patient in the hospital for further evaluation and management.  Assessment and plan 1-COPD exacerbation/Bronchiectasis and hypoxia - So far good saturation on 2 L supplementation appreciated. - Improved air movement bilaterally and no significant wheezing appreciated on exam.  Positive scattered rhonchi - Continue treatment with steroids, oral antibiotics, mucolytics, bronchodilator management and the use of flutter valve/incentive spirometer - Desaturation screening requested.  2-history of hypertension; presenting with low blood pressure - Most likely associated with adrenal insufficiency - Despite the use of steroids patient's cortisol level was low for the time  that was checked. - Currently receiving Solu-Medrol  as part of treatment for his COPD exacerbation - Would recommend tapering process at discharge - Continue to follow vital signs.  Overall feeling better and denying any dizziness or associated lightheadedness. - TSH within normal limits - Follow-up 2D echo. - No chest pain reported.  3-GERD - Continue PPI.  4-chronic pain syndrome - Continue home analgesic therapy - As needed Tylenol  and Robaxin  has also been added to his regimen. - Patient actively following with pain clinic as an outpatient; continue patient follow-up at discharge.  5-depression/anxiety - Continue home anxiolytic and psychotropic agents - No suicidal ideation or hallucinations appreciated.  6-tobacco/alcohol  abuse - Cessation counseling provided - Continue nicotine  patch - No signs of withdrawal symptoms appreciated (patient reports almost complete abstinence since his/hospitalization) - Continue thiamine  and folic acid .  7-lower extremity swelling - Most likely associated with venous insufficiency - Follow echo results - TED hoses, low-sodium diet and leg elevation discussed with patient - Once further stabilized resume home dose of diuretics.  8-constipation - In the setting of chronic analgesic therapy - Continue as needed MiraLAX - Patient has been started on Linzess. - Patient has been instructed to maintain adequate hydration and to increase fiber intake in his diet.  9-hyperglycemia - In the setting of his steroids usage - No prior history of diabetes - A1c 5.5.   Subjective:  No chest pain, no nausea, no vomiting.  Overall feeling better and breathing easier.  Patient has noticed improvement in his lower extremity swelling and also improvement in his blood pressure levels.  Physical Exam: Vitals:   06/09/24 2309 06/10/24 0129 06/10/24 0435 06/10/24 1340  BP: 138/76 (!) 143/81 (!) 141/76 (!) 143/73  Pulse: 75 75 74 74  Resp: 18 18 18 17    Temp: 97.7 F (36.5 C) 97.7  F (36.5 C) 98.2 F (36.8 C) 97.9 F (36.6 C)  TempSrc: Oral Oral  Oral  SpO2: 96% 96% 97% 98%  Weight:      Height:       General exam: Alert, awake, oriented x 3; in no acute distress. Respiratory system: Improved air movement bilaterally; positive scattered rhonchi.  Currently without significant wheezing on exam.  Good saturation on 2 L supplementation. Cardiovascular system:RRR. No murmurs, rubs, gallops. Gastrointestinal system: Abdomen is nondistended, soft and nontender. No organomegaly or masses felt. Normal bowel sounds heard. Central nervous system: Moving 4 limbs spontaneously.  No focal neurological deficits. Extremities: No cyanosis or clubbing; trace edema appreciated bilaterally. Skin: No petechiae or open wounds. Psychiatry: Judgement and insight appear normal. Mood & affect appropriate.    Data Reviewed: A1c: 5.5 CBC: WBCs 4.5, hemoglobin 11.0 and platelet count 157K Basic metabolic panel: Sodium 142, potassium 4.0, chloride 105, bicarb 26, glucose 158, BUN 16, creatinine 0.70 and GFR >60  Family Communication: Daughter updated over the phone.  Disposition: Status is: Observation The patient remains OBS appropriate and will d/c before 2 midnights.  Time spent: 50 minutes  Author: Eric Nunnery, MD 06/10/2024 4:21 PM  For on call review [email or web address].

## 2024-06-10 NOTE — Progress Notes (Signed)
  Echocardiogram 2D Echocardiogram has been performed.  Tinnie FORBES Gosling RDCS 06/10/2024, 2:23 PM

## 2024-06-10 NOTE — Care Management Obs Status (Signed)
 MEDICARE OBSERVATION STATUS NOTIFICATION   Patient Details  Name: Frank Page MRN: [id number] Date of Birth: 04/04/1971   Medicare Observation Status Notification Given:  Yes    Noreen KATHEE Pinal, LCSWA 06/10/2024, 11:58 AM

## 2024-06-10 NOTE — Plan of Care (Signed)

## 2024-06-11 ENCOUNTER — Other Ambulatory Visit: Payer: Self-pay | Admitting: Internal Medicine

## 2024-06-11 DIAGNOSIS — E861 Hypovolemia: Secondary | ICD-10-CM

## 2024-06-11 DIAGNOSIS — J441 Chronic obstructive pulmonary disease with (acute) exacerbation: Secondary | ICD-10-CM | POA: Diagnosis not present

## 2024-06-11 MED ORDER — DOXYCYCLINE HYCLATE 100 MG PO TABS: 100.0000 mg | ORAL_TABLET | Freq: Two times a day (BID) | ORAL | 0 refills | Status: DC

## 2024-06-11 MED ORDER — PREDNISONE 10 MG PO TABS: ORAL_TABLET | ORAL | 0 refills | Status: AC

## 2024-06-11 MED ORDER — GUAIFENESIN ER 600 MG PO TB12: 600.0000 mg | ORAL_TABLET | Freq: Two times a day (BID) | ORAL | 0 refills | Status: DC

## 2024-06-11 MED ORDER — FLUTICASONE-SALMETEROL 115-21 MCG/ACT IN AERO: 2.0000 | INHALATION_SPRAY | Freq: Two times a day (BID) | RESPIRATORY_TRACT | 12 refills | Status: AC

## 2024-06-11 MED ORDER — PREDNISONE 10 MG PO TABS: ORAL_TABLET | ORAL | 0 refills | Status: DC

## 2024-06-11 MED ORDER — FLUTICASONE-SALMETEROL 115-21 MCG/ACT IN AERO: 2.0000 | INHALATION_SPRAY | Freq: Two times a day (BID) | RESPIRATORY_TRACT | 12 refills | Status: DC

## 2024-06-11 MED ORDER — GUAIFENESIN ER 600 MG PO TB12: 600.0000 mg | ORAL_TABLET | Freq: Two times a day (BID) | ORAL | 0 refills | Status: AC

## 2024-06-11 MED ORDER — DOXYCYCLINE HYCLATE 100 MG PO TABS: 100.0000 mg | ORAL_TABLET | Freq: Two times a day (BID) | ORAL | 0 refills | Status: AC

## 2024-06-11 NOTE — Progress Notes (Signed)
 This RN called [facility] transport to take patient home to his residence at [address] [place]. Kellogg RN

## 2024-06-11 NOTE — Progress Notes (Signed)
 Ambulation with SPO2 performed. Pt ambulated 3 laps around unit at steady pace. At beginning of ambulation SPO2 was 97% on RA with pulse of 80. During ambulation SPO2 fluctuated between 95%-97% on RA and heart rate between  80-90 bpm. He reported mild shortness of breath on lap 2 but not appear to be in distress.Kellogg RN

## 2024-06-11 NOTE — Discharge Summary (Signed)
 Physician Discharge Summary  Frank Page FMW:[id number] DOB: 1971-04-19 DOA: 06/09/2024  PCP: [facility]  Admit date: 06/09/2024 Discharge date: 06/11/2024  Admitted From: Home Disposition: Home  Recommendations for Outpatient Follow-up:  Follow up with PCP in 1-2 weeks Please obtain BMP/CBC in one week   Home Health: N/A Equipment/Devices: Nebulizer, present at home  Discharge Condition: Stable CODE STATUS: Full code Diet recommendation: Regular diet  Discharge summary: 53 year old with history of COPD, ongoing smoker, chronic pain syndrome on Suboxone, hypertension, recent hospitalization to outside hospital for pneumonia and COPD exacerbation came back to the hospital due to persistent shortness of breath and possibly he needs oxygen.  He went to PCP office and found to be hypoxemic and low blood pressure.  In the emergency room hemodynamically stable.  Chest x-ray and CT angiogram of chest with atelectatic changes, resolving patchy infiltrates, no pulmonary embolism.  Patient has chronic emphysematous changes.  Treated for COPD exacerbation, bronchiectasis.  COPD with exacerbation, hypoxia: With recent hospitalization, patient found to be recovering from pneumonia and hypoxemia.  He was not found to have any new evidence of pneumonia. Patient was treated with IV steroids, oral antibiotics, mucolytic's and bronchodilator therapy along with chest physiotherapy with good clinical response.  He was mobilized around today in the hallway x 2 without any drop in oxygen saturation.  Patient was minimal symptoms of shortness of breath after 2 laps in the hospital.  Remained on room air without desaturation. Echocardiogram was done that was essentially normal. Plan: Continue chest physiotherapy, incentive spirometry at home. Quit smoking, counseling done. Continue to use albuterol  as needed. Will prescribe Advair or replacement. Did not qualify for oxygen. Short course  of a steroid taper. Doxycycline for 7 days. Continue PPI.   Chronic issues including chronic pain syndrome, GERD, stable on home medications.  Stable to discharge home today with outpatient follow-up.  Discharge Diagnoses:  Principal Problem:   Hypotension    Discharge Instructions  Discharge Instructions     Diet general   Complete by: As directed    Discharge instructions   Complete by: As directed    Continue to use incentive spirometer at home .   Increase activity slowly   Complete by: As directed       Allergies as of 06/11/2024       Reactions   Penicillins Anaphylaxis        Medication List     TAKE these medications    albuterol  108 (90 Base) MCG/ACT inhaler Commonly known as: VENTOLIN  HFA Inhale 1 puff into the lungs every 4 (four) hours as needed for wheezing or shortness of breath.   Buprenorphine HCl-Naloxone HCl 8-2 MG Film Place 1 Film under the tongue daily.   buPROPion 150 MG 24 hr tablet Commonly known as: WELLBUTRIN XL Take 150 mg by mouth every morning.   doxycycline 100 MG tablet Commonly known as: VIBRA-TABS Take 1 tablet (100 mg total) by mouth every 12 (twelve) hours for 7 days.   doxylamine (Sleep) 25 MG tablet Commonly known as: UNISOM Take 50 mg by mouth at bedtime as needed for sleep.   FLUoxetine 10 MG capsule Commonly known as: PROZAC Take 10 mg by mouth daily.   fluticasone-salmeterol 115-21 MCG/ACT inhaler Commonly known as: Advair HFA Inhale 2 puffs into the lungs 2 (two) times daily.   furosemide 20 MG tablet Commonly known as: LASIX Take 20 mg by mouth daily.   guaiFENesin 600 MG 12 hr tablet Commonly known as:  Mucinex Take 1 tablet (600 mg total) by mouth 2 (two) times daily for 14 days.   hydrOXYzine  25 MG tablet Commonly known as: ATARAX  Take 25 mg by mouth 2 (two) times daily as needed.   ipratropium-albuterol  0.5-2.5 (3) MG/3ML Soln Commonly known as: DUONEB Inhale 3 mLs into the lungs every 8  (eight) hours as needed (wheezing/ shortness of breath).   meloxicam 7.5 MG tablet Commonly known as: MOBIC Take 7.5 mg by mouth daily.   nicotine  21 mg/24hr patch Commonly known as: NICODERM CQ  - dosed in mg/24 hours Place 1 patch (21 mg total) onto the skin daily at 6 (six) AM.   predniSONE 10 MG tablet Commonly known as: DELTASONE Take 3 tablets (30 mg total) by mouth daily for 2 days, THEN 2 tablets (20 mg total) daily for 2 days, THEN 1 tablet (10 mg total) daily for 2 days. Start taking on: June 11, 2024   tiZANidine 2 MG tablet Commonly known as: ZANAFLEX Take 2 mg by mouth 4 (four) times daily as needed for muscle spasms.   traZODone  50 MG tablet Commonly known as: DESYREL  Take 1 tablet (50 mg total) by mouth every other day as needed for sleep. What changed: when to take this        Allergies  Allergen Reactions   Penicillins Anaphylaxis    Consultations: None   Procedures/Studies: ECHOCARDIOGRAM COMPLETE Result Date: 06/10/2024    ECHOCARDIOGRAM REPORT   Patient Name:   REX OESTERLE Date of Exam: 06/10/2024 Medical Rec #:  [id number]        Height:       62.0 in Accession #:    [id number]       Weight:       154.0 lb Date of Birth:  April 29, 1971        BSA:          1.711 m Patient Age:    53 years         BP:           141/76 mmHg Patient Gender: M                HR:           74 bpm. Exam Location:  [facility] Procedure: 2D Echo, Color Doppler and Cardiac Doppler (Both Spectral and Color            Flow Doppler were utilized during procedure). Indications:    Dyspnea R06.00  History:        Patient has no prior history of Echocardiogram examinations.                 COPD.  Sonographer:    Tinnie Gosling RDCS Referring Phys: [phone] CARLOS MADERA IMPRESSIONS  1. Difficult acoustic windows NO definite wall motion abnormalities. Left ventricular ejection fraction, by estimation, is 55 to 60%. The left ventricle has normal function. Left ventricular diastolic parameters  were normal.  2. Right ventricular systolic function is normal. The right ventricular size is normal.  3. The mitral valve is normal in structure. Mild mitral valve regurgitation.  4. The aortic valve is tricuspid. Aortic valve regurgitation is not visualized.  5. The inferior vena cava is dilated in size with <50% respiratory variability, suggesting right atrial pressure of 15 mmHg. FINDINGS  Left Ventricle: Difficult acoustic windows NO definite wall motion abnormalities. Left ventricular ejection fraction, by estimation, is 55 to 60%. The left ventricle has normal function. The left ventricular internal cavity  size was normal in size. There is no left ventricular hypertrophy. Left ventricular diastolic parameters were normal. Right Ventricle: The right ventricular size is normal. Right vetricular wall thickness was not assessed. Right ventricular systolic function is normal. Left Atrium: Left atrial size was normal in size. Right Atrium: Right atrial size was normal in size. Pericardium: There is no evidence of pericardial effusion. Mitral Valve: The mitral valve is normal in structure. Mild mitral valve regurgitation. Tricuspid Valve: The tricuspid valve is normal in structure. Tricuspid valve regurgitation is trivial. Aortic Valve: The aortic valve is tricuspid. Aortic valve regurgitation is not visualized. Pulmonic Valve: The pulmonic valve was not well visualized. Pulmonic valve regurgitation is not visualized. No evidence of pulmonic stenosis. Aorta: The aortic root and ascending aorta are structurally normal, with no evidence of dilitation. Venous: The inferior vena cava is dilated in size with less than 50% respiratory variability, suggesting right atrial pressure of 15 mmHg. IAS/Shunts: No atrial level shunt detected by color flow Doppler.  LEFT VENTRICLE PLAX 2D LVIDd:         5.20 cm   Diastology LVIDs:         4.00 cm   LV e' medial:    6.85 cm/s LV PW:         0.90 cm   LV E/e' medial:  11.7 LV IVS:         0.80 cm   LV e' lateral:   12.10 cm/s LVOT diam:     1.90 cm   LV E/e' lateral: 6.6 LV SV:         63 LV SV Index:   37 LVOT Area:     2.84 cm  RIGHT VENTRICLE             IVC RV S prime:     12.50 cm/s  IVC diam: 2.30 cm TAPSE (M-mode): 2.4 cm LEFT ATRIUM             Index        RIGHT ATRIUM           Index LA diam:        3.80 cm 2.22 cm/m   RA Area:     13.60 cm LA Vol (A2C):   42.2 ml 24.67 ml/m  RA Volume:   31.50 ml  18.41 ml/m LA Vol (A4C):   35.1 ml 20.52 ml/m LA Biplane Vol: 39.8 ml 23.27 ml/m  AORTIC VALVE LVOT Vmax:   97.70 cm/s LVOT Vmean:  66.900 cm/s LVOT VTI:    0.221 m  AORTA Ao Root diam: 2.80 cm Ao Asc diam:  3.00 cm MITRAL VALVE MV Area (PHT): 5.62 cm    SHUNTS MV Decel Time: 135 msec    Systemic VTI:  0.22 m MV E velocity: 79.90 cm/s  Systemic Diam: 1.90 cm MV A velocity: 66.00 cm/s MV E/A ratio:  1.21 Vina Gull MD Electronically signed by Vina Gull MD Signature Date/Time: 06/10/2024/3:12:38 PM    Final    CT Angio Chest Pulmonary Embolism (PE) W or WO Contrast Result Date: 06/09/2024 EXAM: CTA CHEST 06/09/2024 01:41:42 PM TECHNIQUE: CTA of the chest was performed without and with the administration of 75 mL of iohexol  (OMNIPAQUE ) 350 MG/ML intravenous contrast. Multiplanar reformatted images are provided for review. MIP images are provided for review. Automated exposure control, iterative reconstruction, and/or weight based adjustment of the mA/kV was utilized to reduce the radiation dose to as low as reasonably achievable. COMPARISON: None available. CLINICAL HISTORY: SOB,  recent hospital stay, leg swelling, hx of PNA. FINDINGS: PULMONARY ARTERIES: Pulmonary arteries are adequately opacified for evaluation. No acute pulmonary embolus. Main pulmonary artery is normal in caliber. MEDIASTINUM: The heart and pericardium demonstrate no acute abnormality. Faint atheromatous vascular calcifications in the left anterior descending coronary artery (LAD). Aberrant right subclavian  artery passes behind the esophagus. There is no acute abnormality of the thoracic aorta. LYMPH NODES: Conglomerate right hilar adenopathy 1.3 cm in diameter, image 64, series 5. Upper normal size left hilar and bilateral infrahilar lymph nodes. LUNGS AND PLEURA: Patchy airspace opacities favoring the right upper lobe but also with scattered ground glass and faint airspace opacities in the right lower lobe, left lower lobe, and left upper lobe. Mild bilateral airway thickening. This pattern can be seen in pulmonary hemorrhage, atypical pneumonia, acute eosinophilic pneumonia, acute hypersensitivity pneumonitis, or in pulmonary edema (although no cardiomegaly is present to indicate cardiogenic pulmonary edema). Keeping the right greater than left nodal prominence, atypical pneumonia is favored. No evidence of pleural effusion or pneumothorax. UPPER ABDOMEN: Limited images of the upper abdomen are unremarkable. SOFT TISSUES AND BONES: No acute bone or soft tissue abnormality. IMPRESSION: 1. No pulmonary embolism. 2. Patchy multifocal airspace opacities with mild bilateral airway thickening; atypical pneumonia is favored. 3. Right hilar adenopathy measuring 1.3 cm, with upper-normal left hilar and bilateral infrahilar lymph nodes. Electronically signed by: Ryan Salvage MD 06/09/2024 03:17 PM EST RP Workstation: [id number]   DG Chest Port 1 View Result Date: 06/09/2024 EXAM: 1 VIEW(S) XRAY OF THE CHEST 06/09/2024 12:00:00 PM COMPARISON: Chest radiograph 08/11/2022. CLINICAL HISTORY: SOB (shortness of breath). FINDINGS: LUNGS AND PLEURA: Patchy opacities over the right upper lobe which could reflect atelectasis versus infiltrate. No pulmonary edema. No pleural effusion. No pneumothorax. HEART AND MEDIASTINUM: No acute abnormality of the cardiac and mediastinal silhouettes. BONES AND SOFT TISSUES: Partially visualized lower cervical spine fusion hardware. IMPRESSION: 1. Patchy right upper lobe opacities, suspicious  for atelectasis versus pneumonia. Electronically signed by: Donnice Mania MD 06/09/2024 12:59 PM EST RP Workstation: [id number]   (Echo, Carotid, EGD, Colonoscopy, ERCP)    Subjective: Patient was seen and examined.  No overnight events.  Occasional dry cough present.  Denied any shortness of breath at rest.  Mobilized around on room air without any difficulties.  Comfortable with plan to go home.   Discharge Exam: Vitals:   06/11/24 0534 06/11/24 1132  BP: (!) 148/95 131/80  Pulse: 78 71  Resp: 17 19  Temp: 97.6 F (36.4 C) 98.2 F (36.8 C)  SpO2: 98% 97%   Vitals:   06/10/24 1944 06/10/24 2001 06/11/24 0534 06/11/24 1132  BP:  127/75 (!) 148/95 131/80  Pulse:  75 78 71  Resp:  18 17 19   Temp:  98.1 F (36.7 C) 97.6 F (36.4 C) 98.2 F (36.8 C)  TempSrc:  Oral Oral Oral  SpO2: 98% 95% 98% 97%  Weight:      Height:        General: Pt is alert, awake, not in acute distress Cardiovascular: RRR, S1/S2 +, no rubs, no gallops Respiratory: CTA bilaterally, no wheezing, no rhonchi, there was no added sounds.  He was on room air. Abdominal: Soft, NT, ND, bowel sounds + Extremities: no edema, no cyanosis    The results of significant diagnostics from this hospitalization (including imaging, microbiology, ancillary and laboratory) are listed below for reference.     Microbiology: Recent Results (from the past 240 hours)  Culture, blood (routine x 2)  Status: None (Preliminary result)   Collection Time: 06/09/24 11:47 AM   Specimen: BLOOD  Result Value Ref Range Status   Specimen Description BLOOD RIGHT ASSIST CONTROL  Final   Special Requests   Final    BOTTLES DRAWN AEROBIC AND ANAEROBIC Blood Culture adequate volume   Culture   Final    NO GROWTH 2 DAYS Performed at [facility], [address] [place], [place] [address]    Report Status PENDING  Incomplete  Resp panel by RT-PCR (RSV, Flu A&B, Covid) Anterior Nasal Swab     Status: None   Collection Time:  06/09/24 12:02 PM   Specimen: Anterior Nasal Swab  Result Value Ref Range Status   SARS Coronavirus 2 by RT PCR NEGATIVE NEGATIVE Final    Comment: (NOTE) SARS-CoV-2 target nucleic acids are NOT DETECTED.  The SARS-CoV-2 RNA is generally detectable in upper respiratory specimens during the acute phase of infection. The lowest concentration of SARS-CoV-2 viral copies this assay can detect is 138 copies/mL. A negative result does not preclude SARS-Cov-2 infection and should not be used as the sole basis for treatment or other patient management decisions. A negative result may occur with  improper specimen collection/handling, submission of specimen other than nasopharyngeal swab, presence of viral mutation(s) within the areas targeted by this assay, and inadequate number of viral copies(<138 copies/mL). A negative result must be combined with clinical observations, patient history, and epidemiological information. The expected result is Negative.  Fact Sheet for Patients:  bloggercourse.com  Fact Sheet for Healthcare Providers:  seriousbroker.it  This test is no t yet approved or cleared by the [place]  FDA and  has been authorized for detection and/or diagnosis of SARS-CoV-2 by FDA under an Emergency Use Authorization (EUA). This EUA will remain  in effect (meaning this test can be used) for the duration of the COVID-19 declaration under Section 564(b)(1) of the Act, 21 U.S.C.section 360bbb-3(b)(1), unless the authorization is terminated  or revoked sooner.       Influenza A by PCR NEGATIVE NEGATIVE Final   Influenza B by PCR NEGATIVE NEGATIVE Final    Comment: (NOTE) The Xpert Xpress SARS-CoV-2/FLU/RSV plus assay is intended as an aid in the diagnosis of influenza from Nasopharyngeal swab specimens and should not be used as a sole basis for treatment. Nasal washings and aspirates are unacceptable for Xpert Xpress  SARS-CoV-2/FLU/RSV testing.  Fact Sheet for Patients: bloggercourse.com  Fact Sheet for Healthcare Providers: seriousbroker.it  This test is not yet approved or cleared by the [place]  FDA and has been authorized for detection and/or diagnosis of SARS-CoV-2 by FDA under an Emergency Use Authorization (EUA). This EUA will remain in effect (meaning this test can be used) for the duration of the COVID-19 declaration under Section 564(b)(1) of the Act, 21 U.S.C. section 360bbb-3(b)(1), unless the authorization is terminated or revoked.     Resp Syncytial Virus by PCR NEGATIVE NEGATIVE Final    Comment: (NOTE) Fact Sheet for Patients: bloggercourse.com  Fact Sheet for Healthcare Providers: seriousbroker.it  This test is not yet approved or cleared by the [place]  FDA and has been authorized for detection and/or diagnosis of SARS-CoV-2 by FDA under an Emergency Use Authorization (EUA). This EUA will remain in effect (meaning this test can be used) for the duration of the COVID-19 declaration under Section 564(b)(1) of the Act, 21 U.S.C. section 360bbb-3(b)(1), unless the authorization is terminated or revoked.  Performed at [facility], [address] [place], [place] [address]  Culture, blood (routine x 2)     Status: None (Preliminary result)   Collection Time: 06/09/24 12:08 PM   Specimen: BLOOD  Result Value Ref Range Status   Specimen Description BLOOD BLOOD LEFT ARM  Final   Special Requests   Final    BOTTLES DRAWN AEROBIC AND ANAEROBIC Blood Culture adequate volume   Culture   Final    NO GROWTH 2 DAYS Performed at [facility], [address] [place], [place] [address]    Report Status PENDING  Incomplete     Labs: BNP (last 3 results) No results for input(s): BNP in the last 8760 hours. Basic Metabolic Panel: Recent Labs  Lab 06/09/24 1135  06/09/24 1440 06/10/24 0439  NA 141  --  142  K 3.8  --  4.0  CL 102  --  105  CO2 28  --  26  GLUCOSE 92  --  158*  BUN 12  --  16  CREATININE 0.81  --  0.70  CALCIUM 9.3  --  9.1  PHOS  --  3.4  --    Liver Function Tests: No results for input(s): AST, ALT, ALKPHOS, BILITOT, PROT, ALBUMIN in the last 168 hours. No results for input(s): LIPASE, AMYLASE in the last 168 hours. No results for input(s): AMMONIA in the last 168 hours. CBC: Recent Labs  Lab 06/09/24 1135 06/10/24 0439  WBC 6.0 4.5  NEUTROABS 3.5  --   HGB 11.7* 11.0*  HCT 35.3* 33.4*  MCV 91.0 90.3  PLT 153 157   Cardiac Enzymes: No results for input(s): CKTOTAL, CKMB, CKMBINDEX, TROPONINI in the last 168 hours. BNP: Invalid input(s): POCBNP CBG: No results for input(s): GLUCAP in the last 168 hours. D-Dimer No results for input(s): DDIMER in the last 72 hours. Hgb A1c Recent Labs    06/10/24 0439  HGBA1C 5.5   Lipid Profile No results for input(s): CHOL, HDL, LDLCALC, TRIG, CHOLHDL, LDLDIRECT in the last 72 hours. Thyroid  function studies Recent Labs    06/09/24 1434  TSH 1.310   Anemia work up No results for input(s): VITAMINB12, FOLATE, FERRITIN, TIBC, IRON, RETICCTPCT in the last 72 hours. Urinalysis    Component Value Date/Time   COLORURINE STRAW (A) 12/17/2020 1913   APPEARANCEUR CLEAR 12/17/2020 1913   LABSPEC 1.002 (L) 12/17/2020 1913   PHURINE 7.0 12/17/2020 1913   GLUCOSEU NEGATIVE 12/17/2020 1913   HGBUR NEGATIVE 12/17/2020 1913   BILIRUBINUR NEGATIVE 12/17/2020 1913   KETONESUR NEGATIVE 12/17/2020 1913   PROTEINUR NEGATIVE 12/17/2020 1913   UROBILINOGEN 0.2 03/09/2014 1914   NITRITE NEGATIVE 12/17/2020 1913   LEUKOCYTESUR NEGATIVE 12/17/2020 1913   Sepsis Labs Recent Labs  Lab 06/09/24 1135 06/10/24 0439  WBC 6.0 4.5   Microbiology Recent Results (from the past 240 hours)  Culture, blood (routine x 2)      Status: None (Preliminary result)   Collection Time: 06/09/24 11:47 AM   Specimen: BLOOD  Result Value Ref Range Status   Specimen Description BLOOD RIGHT ASSIST CONTROL  Final   Special Requests   Final    BOTTLES DRAWN AEROBIC AND ANAEROBIC Blood Culture adequate volume   Culture   Final    NO GROWTH 2 DAYS Performed at [facility], [address], [place], [place] [address]    Report Status PENDING  Incomplete  Resp panel by RT-PCR (RSV, Flu A&B, Covid) Anterior Nasal Swab     Status: None   Collection Time: 06/09/24 12:02 PM   Specimen:  Anterior Nasal Swab  Result Value Ref Range Status   SARS Coronavirus 2 by RT PCR NEGATIVE NEGATIVE Final    Comment: (NOTE) SARS-CoV-2 target nucleic acids are NOT DETECTED.  The SARS-CoV-2 RNA is generally detectable in upper respiratory specimens during the acute phase of infection. The lowest concentration of SARS-CoV-2 viral copies this assay can detect is 138 copies/mL. A negative result does not preclude SARS-Cov-2 infection and should not be used as the sole basis for treatment or other patient management decisions. A negative result may occur with  improper specimen collection/handling, submission of specimen other than nasopharyngeal swab, presence of viral mutation(s) within the areas targeted by this assay, and inadequate number of viral copies(<138 copies/mL). A negative result must be combined with clinical observations, patient history, and epidemiological information. The expected result is Negative.  Fact Sheet for Patients:  bloggercourse.com  Fact Sheet for Healthcare Providers:  seriousbroker.it  This test is no t yet approved or cleared by the [place]  FDA and  has been authorized for detection and/or diagnosis of SARS-CoV-2 by FDA under an Emergency Use Authorization (EUA). This EUA will remain  in effect (meaning this test can be used) for the duration of  the COVID-19 declaration under Section 564(b)(1) of the Act, 21 U.S.C.section 360bbb-3(b)(1), unless the authorization is terminated  or revoked sooner.       Influenza A by PCR NEGATIVE NEGATIVE Final   Influenza B by PCR NEGATIVE NEGATIVE Final    Comment: (NOTE) The Xpert Xpress SARS-CoV-2/FLU/RSV plus assay is intended as an aid in the diagnosis of influenza from Nasopharyngeal swab specimens and should not be used as a sole basis for treatment. Nasal washings and aspirates are unacceptable for Xpert Xpress SARS-CoV-2/FLU/RSV testing.  Fact Sheet for Patients: bloggercourse.com  Fact Sheet for Healthcare Providers: seriousbroker.it  This test is not yet approved or cleared by the [place]  FDA and has been authorized for detection and/or diagnosis of SARS-CoV-2 by FDA under an Emergency Use Authorization (EUA). This EUA will remain in effect (meaning this test can be used) for the duration of the COVID-19 declaration under Section 564(b)(1) of the Act, 21 U.S.C. section 360bbb-3(b)(1), unless the authorization is terminated or revoked.     Resp Syncytial Virus by PCR NEGATIVE NEGATIVE Final    Comment: (NOTE) Fact Sheet for Patients: bloggercourse.com  Fact Sheet for Healthcare Providers: seriousbroker.it  This test is not yet approved or cleared by the [place]  FDA and has been authorized for detection and/or diagnosis of SARS-CoV-2 by FDA under an Emergency Use Authorization (EUA). This EUA will remain in effect (meaning this test can be used) for the duration of the COVID-19 declaration under Section 564(b)(1) of the Act, 21 U.S.C. section 360bbb-3(b)(1), unless the authorization is terminated or revoked.  Performed at [facility], [address] [place], [place] [address]   Culture, blood (routine x 2)     Status: None (Preliminary result)   Collection  Time: 06/09/24 12:08 PM   Specimen: BLOOD  Result Value Ref Range Status   Specimen Description BLOOD BLOOD LEFT ARM  Final   Special Requests   Final    BOTTLES DRAWN AEROBIC AND ANAEROBIC Blood Culture adequate volume   Culture   Final    NO GROWTH 2 DAYS Performed at [facility], [address] [place], [place] [address]    Report Status PENDING  Incomplete     Time coordinating discharge: 32 minutes  SIGNED:   Renato Applebaum, MD  [facility] 06/11/2024, 12:26 PM

## 2024-06-11 NOTE — Plan of Care (Signed)
  Problem: Clinical Measurements: Goal: Respiratory complications will improve Outcome: Progressing   Problem: Activity: Goal: Risk for activity intolerance will decrease Outcome: Progressing   Problem: Pain Managment: Goal: General experience of comfort will improve and/or be controlled Outcome: Progressing

## 2024-06-15 LAB — CULTURE, BLOOD (ROUTINE X 2)
Culture: NO GROWTH
Culture: NO GROWTH
Special Requests: ADEQUATE
Special Requests: ADEQUATE

## 2024-08-15 ENCOUNTER — Encounter: Payer: Self-pay | Admitting: General Practice
